# Patient Record
Sex: Male | Born: 1950
Health system: Southern US, Community
[De-identification: ages and names within clinical notes are randomized; demographics above are authoritative.]

## PROBLEM LIST (undated history)

## (undated) DIAGNOSIS — E785 Hyperlipidemia, unspecified: Secondary | ICD-10-CM

## (undated) DIAGNOSIS — I10 Essential (primary) hypertension: Secondary | ICD-10-CM

## (undated) DIAGNOSIS — E8881 Metabolic syndrome: Secondary | ICD-10-CM

## (undated) DIAGNOSIS — I639 Cerebral infarction, unspecified: Secondary | ICD-10-CM

## (undated) HISTORY — DX: Cerebral infarction, unspecified: I63.9

## (undated) HISTORY — DX: Essential (primary) hypertension: I10

## (undated) HISTORY — DX: Hyperlipidemia, unspecified: E78.5

## (undated) HISTORY — DX: Metabolic syndrome: E88.81

## (undated) HISTORY — PX: SHOULDER SURGERY: SHX246

## (undated) HISTORY — DX: Metabolic syndrome: E88.810

## (undated) HISTORY — PX: KNEE ARTHROSCOPY: SUR90

---

## 2003-04-16 ENCOUNTER — Ambulatory Visit (HOSPITAL_COMMUNITY): Admission: RE | Admit: 2003-04-16 | Discharge: 2003-04-16 | Payer: Self-pay | Admitting: Gastroenterology

## 2012-01-16 ENCOUNTER — Other Ambulatory Visit: Payer: Self-pay | Admitting: Dermatology

## 2012-07-25 ENCOUNTER — Other Ambulatory Visit: Payer: Self-pay | Admitting: Dermatology

## 2012-07-31 ENCOUNTER — Other Ambulatory Visit: Payer: Self-pay | Admitting: Dermatology

## 2012-09-17 ENCOUNTER — Encounter: Payer: Self-pay | Admitting: Nurse Practitioner

## 2012-09-17 ENCOUNTER — Ambulatory Visit (INDEPENDENT_AMBULATORY_CARE_PROVIDER_SITE_OTHER): Payer: BC Managed Care – PPO | Admitting: Nurse Practitioner

## 2012-09-17 VITALS — BP 122/76 | HR 66 | Temp 98.7°F | Ht 68.0 in | Wt 216.0 lb

## 2012-09-17 DIAGNOSIS — Z23 Encounter for immunization: Secondary | ICD-10-CM

## 2012-09-17 DIAGNOSIS — I10 Essential (primary) hypertension: Secondary | ICD-10-CM | POA: Insufficient documentation

## 2012-09-17 DIAGNOSIS — E785 Hyperlipidemia, unspecified: Secondary | ICD-10-CM | POA: Insufficient documentation

## 2012-09-17 DIAGNOSIS — Z01419 Encounter for gynecological examination (general) (routine) without abnormal findings: Secondary | ICD-10-CM

## 2012-09-17 DIAGNOSIS — E1159 Type 2 diabetes mellitus with other circulatory complications: Secondary | ICD-10-CM | POA: Insufficient documentation

## 2012-09-17 DIAGNOSIS — M109 Gout, unspecified: Secondary | ICD-10-CM | POA: Insufficient documentation

## 2012-09-17 DIAGNOSIS — Z1211 Encounter for screening for malignant neoplasm of colon: Secondary | ICD-10-CM

## 2012-09-17 DIAGNOSIS — Z125 Encounter for screening for malignant neoplasm of prostate: Secondary | ICD-10-CM

## 2012-09-17 LAB — COMPLETE METABOLIC PANEL WITH GFR
AST: 19 U/L (ref 0–37)
Alkaline Phosphatase: 38 U/L — ABNORMAL LOW (ref 39–117)
BUN: 11 mg/dL (ref 6–23)
GFR, Est Non African American: 80 mL/min
Glucose, Bld: 96 mg/dL (ref 70–99)
Sodium: 140 mEq/L (ref 135–145)
Total Bilirubin: 1.2 mg/dL (ref 0.3–1.2)
Total Protein: 7 g/dL (ref 6.0–8.3)

## 2012-09-17 NOTE — Progress Notes (Signed)
  Subjective:    Patient ID: David Garza, male    DOB: 06-04-1950, 62 y.o.   MRN: [id number]  Hypertension This is a chronic problem. The current episode started more than 1 year ago. The problem is unchanged. The problem is controlled. Pertinent negatives include no blurred vision, chest pain, headaches, malaise/fatigue, palpitations, peripheral edema or shortness of breath. There are no associated agents to hypertension. Risk factors for coronary artery disease include dyslipidemia, male gender and obesity. Past treatments include ACE inhibitors and diuretics. The current treatment provides significant improvement. Compliance problems include diet and exercise.   Hyperlipidemia This is a chronic problem. The current episode started more than 1 year ago. The problem is uncontrolled. Recent lipid tests were reviewed and are high. Exacerbating diseases include obesity. Pertinent negatives include no chest pain, leg pain, myalgias or shortness of breath. Current antihyperlipidemic treatment includes statins. The current treatment provides moderate improvement of lipids. Compliance problems include adherence to diet and adherence to exercise.  Risk factors for coronary artery disease include male sex, hypertension and obesity.  Gout Patient had a flare up several weeks ago. Took colchicine and cleared up.    Review of Systems  Constitutional: Negative for malaise/fatigue.  Eyes: Negative for blurred vision.  Respiratory: Negative for shortness of breath.   Cardiovascular: Negative for chest pain and palpitations.  Musculoskeletal: Negative for myalgias.  Neurological: Negative for headaches.  All other systems reviewed and are negative.       Objective:   Physical Exam  Constitutional: He is oriented to person, place, and time. He appears well-developed and well-nourished.  HENT:  Head: Normocephalic.  Right Ear: External ear normal.  Left Ear: External ear normal.  Nose: Nose  normal.  Mouth/Throat: Oropharynx is clear and moist.  Eyes: EOM are normal. Pupils are equal, round, and reactive to light.  Neck: Normal range of motion. Neck supple. No thyromegaly present.  Cardiovascular: Normal rate, regular rhythm, normal heart sounds and intact distal pulses.   No murmur heard. Pulmonary/Chest: Effort normal and breath sounds normal. He has no wheezes. He has no rales.  Abdominal: Soft. Bowel sounds are normal.  Genitourinary:  Patient refuses DRE.  Musculoskeletal: Normal range of motion.  Neurological: He is alert and oriented to person, place, and time.  Skin: Skin is warm and dry.  Psychiatric: He has a normal mood and affect. His behavior is normal. Judgment and thought content normal.   BP 122/76  Pulse 66  Temp(Src) 98.7 F (37.1 C) (Oral)  Ht 5\' 8"  (1.727 m)  Wt 216 lb (97.977 kg)  BMI 32.85 kg/m2        Assessment & Plan:  1. Hypertension Low na+ diet - COMPLETE METABOLIC PANEL WITH GFR  2. Hyperlipidemia Low fat diet and exercise - NMR Lipoprofile with Lipids  3. Gout Low purine diet  4. Screening for colon cancer - Ambulatory referral to Gastroenterology  5. Screening for prostate cancer - PSA Mary-Margaret Daphine Deutscher, FNP

## 2012-09-17 NOTE — Patient Instructions (Addendum)
Fat and Cholesterol Control Diet Cholesterol levels in your body are determined significantly by your diet. Cholesterol levels may also be related to heart disease. The following material helps to explain this relationship and discusses what you can do to help keep your heart healthy. Not all cholesterol is bad. Low-density lipoprotein (LDL) cholesterol is the "bad" cholesterol. It may cause fatty deposits to build up inside your arteries. High-density lipoprotein (HDL) cholesterol is "good." It helps to remove the "bad" LDL cholesterol from your blood. Cholesterol is a very important risk factor for heart disease. Other risk factors are high blood pressure, smoking, stress, heredity, and weight. The heart muscle gets its supply of blood through the coronary arteries. If your LDL cholesterol is high and your HDL cholesterol is low, you are at risk for having fatty deposits build up in your coronary arteries. This leaves less room through which blood can flow. Without sufficient blood and oxygen, the heart muscle cannot function properly and you may feel chest pains (angina pectoris). When a coronary artery closes up entirely, a part of the heart muscle may die causing a heart attack (myocardial infarction). CHECKING CHOLESTEROL When your caregiver sends your blood to a lab to be examined for cholesterol, a complete lipid (fat) profile may be done. With this test, the total amount of cholesterol and levels of LDL and HDL are determined. Triglycerides are a type of fat that circulates in the blood. They can also be used to determine heart disease risk. The list below describes what the numbers should be: Test: Total Cholesterol.  Less than 200 mg/dl. Test: LDL "bad cholesterol."  Less than 100 mg/dl.  Less than 70 mg/dl if you are at very high risk of a heart attack or sudden cardiac death. Test: HDL "good cholesterol."  Greater than 50 mg/dl for women.  Greater than 40 mg/dl for men. Test:  Triglycerides.  Less than 150 mg/dl. CONTROLLING CHOLESTEROL WITH DIET Although exercise and lifestyle factors are important, your diet is key. That is because certain foods are known to raise cholesterol and others to lower it. The goal is to balance foods for their effect on cholesterol and more importantly, to replace saturated and trans fat with other types of fat, such as monounsaturated fat, polyunsaturated fat, and omega-3 fatty acids. On average, a person should consume no more than 15 to 17 g of saturated fat daily. Saturated and trans fats are considered "bad" fats, and they will raise LDL cholesterol. Saturated fats are primarily found in animal products such as meats, butter, and cream. However, that does not mean you need to give up all your favorite foods. Today, there are good tasting, low-fat, low-cholesterol substitutes for most of the things you like to eat. Choose low-fat or nonfat alternatives. Choose round or loin cuts of red meat. These types of cuts are lowest in fat and cholesterol. Chicken (without the skin), fish, veal, and ground turkey breast are great choices. Eliminate fatty meats, such as hot dogs and salami. Even shellfish have little or no saturated fat. Have a 3 oz (85 g) portion when you eat lean meat, poultry, or fish. Trans fats are also called "partially hydrogenated oils." They are oils that have been scientifically manipulated so that they are solid at room temperature resulting in a longer shelf life and improved taste and texture of foods in which they are added. Trans fats are found in stick margarine, some tub margarines, cookies, crackers, and baked goods.  When baking and cooking, oils   are a great substitute for butter. The monounsaturated oils are especially beneficial since it is believed they lower LDL and raise HDL. The oils you should avoid entirely are saturated tropical oils, such as coconut and palm.  Remember to eat a lot from food groups that are  naturally free of saturated and trans fat, including fish, fruit, vegetables, beans, grains (barley, rice, couscous, bulgur wheat), and pasta (without cream sauces).  IDENTIFYING FOODS THAT LOWER CHOLESTEROL  Soluble fiber may lower your cholesterol. This type of fiber is found in fruits such as apples, vegetables such as broccoli, potatoes, and carrots, legumes such as beans, peas, and lentils, and grains such as barley. Foods fortified with plant sterols (phytosterol) may also lower cholesterol. You should eat at least 2 g per day of these foods for a cholesterol lowering effect.  Read package labels to identify low-saturated fats, trans fat free, and low-fat foods at the supermarket. Select cheeses that have only 2 to 3 g saturated fat per ounce. Use a heart-healthy tub margarine that is free of trans fats or partially hydrogenated oil. When buying baked goods (cookies, crackers), avoid partially hydrogenated oils. Breads and muffins should be made from whole grains (whole-wheat or whole oat flour, instead of "flour" or "enriched flour"). Buy non-creamy canned soups with reduced salt and no added fats.  FOOD PREPARATION TECHNIQUES  Never deep-fry. If you must fry, either stir-fry, which uses very little fat, or use non-stick cooking sprays. When possible, broil, bake, or roast meats, and steam vegetables. Instead of putting butter or margarine on vegetables, use lemon and herbs, applesauce, and cinnamon (for squash and sweet potatoes), nonfat yogurt, salsa, and low-fat dressings for salads.  LOW-SATURATED FAT / LOW-FAT FOOD SUBSTITUTES Meats / Saturated Fat (g)  Avoid: Steak, marbled (3 oz/85 g) / 11 g  Choose: Steak, lean (3 oz/85 g) / 4 g  Avoid: Hamburger (3 oz/85 g) / 7 g  Choose: Hamburger, lean (3 oz/85 g) / 5 g  Avoid: Ham (3 oz/85 g) / 6 g  Choose: Ham, lean cut (3 oz/85 g) / 2.4 g  Avoid: Chicken, with skin, dark meat (3 oz/85 g) / 4 g  Choose: Chicken, skin removed, dark meat (3  oz/85 g) / 2 g  Avoid: Chicken, with skin, light meat (3 oz/85 g) / 2.5 g  Choose: Chicken, skin removed, light meat (3 oz/85 g) / 1 g Dairy / Saturated Fat (g)  Avoid: Whole milk (1 cup) / 5 g  Choose: Low-fat milk, 2% (1 cup) / 3 g  Choose: Low-fat milk, 1% (1 cup) / 1.5 g  Choose: Skim milk (1 cup) / 0.3 g  Avoid: Hard cheese (1 oz/28 g) / 6 g  Choose: Skim milk cheese (1 oz/28 g) / 2 to 3 g  Avoid: Cottage cheese, 4% fat (1 cup) / 6.5 g  Choose: Low-fat cottage cheese, 1% fat (1 cup) / 1.5 g  Avoid: Ice cream (1 cup) / 9 g  Choose: Sherbet (1 cup) / 2.5 g  Choose: Nonfat frozen yogurt (1 cup) / 0.3 g  Choose: Frozen fruit bar / trace  Avoid: Whipped cream (1 tbs) / 3.5 g  Choose: Nondairy whipped topping (1 tbs) / 1 g Condiments / Saturated Fat (g)  Avoid: Mayonnaise (1 tbs) / 2 g  Choose: Low-fat mayonnaise (1 tbs) / 1 g  Avoid: Butter (1 tbs) / 7 g  Choose: Extra light margarine (1 tbs) / 1 g  Avoid: Coconut oil (1   tbs) / 11.8 g  Choose: Olive oil (1 tbs) / 1.8 g  Choose: Corn oil (1 tbs) / 1.7 g  Choose: Safflower oil (1 tbs) / 1.2 g  Choose: Sunflower oil (1 tbs) / 1.4 g  Choose: Soybean oil (1 tbs) / 2.4 g  Choose: Canola oil (1 tbs) / 1 g Document Released: 04/18/2005 Document Revised: 07/11/2011 Document Reviewed: 10/07/2010 [facility] Patient Information 2013 [place], [place]. Tetanus, Diphtheria, Pertussis (Tdap) Vaccine What You Need to Know WHY GET VACCINATED? Tetanus, diphtheria and pertussis can be very serious diseases, even for adolescents and adults. Tdap vaccine can protect [place] from these diseases. TETANUS (Lockjaw) causes painful muscle tightening and stiffness, usually all over the body.  It can lead to tightening of muscles in the head and neck so you can't open your mouth, swallow, or sometimes even breathe. Tetanus kills about 1 out of 5 people who are infected. DIPHTHERIA can cause a thick coating to form in the back of the  throat.  It can lead to breathing problems, paralysis, heart failure, and death. PERTUSSIS (Whooping Cough) causes severe coughing spells, which can cause difficulty breathing, vomiting and disturbed sleep.  It can also lead to weight loss, incontinence, and rib fractures. Up to 2 in 100 adolescents and 5 in 100 adults with pertussis are hospitalized or have complications, which could include pneumonia and death. These diseases are caused by bacteria. Diphtheria and pertussis are spread from person to person through coughing or sneezing. Tetanus enters the body through cuts, scratches, or wounds. Before vaccines, the [place] saw as many as 200,000 cases a year of diphtheria and pertussis, and hundreds of cases of tetanus. Since vaccination began, tetanus and diphtheria have dropped by about 99% and pertussis by about 80%. TDAP VACCINE Tdap vaccine can protect adolescents and adults from tetanus, diphtheria, and pertussis. One dose of Tdap is routinely given at age 5 or 74. People who did not get Tdap at that age should get it as soon as possible. Tdap is especially important for health care professionals and anyone having close contact with a baby younger than 12 months. Pregnant women should get a dose of Tdap during every pregnancy, to protect the newborn from pertussis. Infants are most at risk for severe, life-threatening complications from pertussis. A similar vaccine, called Td, protects from tetanus and diphtheria, but not pertussis. A Td booster should be given every 10 years. Tdap may be given as one of these boosters if you have not already gotten a dose. Tdap may also be given after a severe cut or burn to prevent tetanus infection. Your doctor can give you more information. Tdap may safely be given at the same time as other vaccines. SOME PEOPLE SHOULD NOT GET THIS VACCINE  If you ever had a life-threatening allergic reaction after a dose of any tetanus, diphtheria, or pertussis  containing vaccine, OR if you have a severe allergy to any part of this vaccine, you should not get Tdap. Tell your doctor if you have any severe allergies.  If you had a coma, or long or multiple seizures within 7 days after a childhood dose of DTP or DTaP, you should not get Tdap, unless a cause other than the vaccine was found. You can still get Td.  Talk to your doctor if you:  have epilepsy or another nervous system problem,  had severe pain or swelling after any vaccine containing diphtheria, tetanus or pertussis,  ever had Guillain-Barr Syndrome (GBS),  aren't feeling well  on the day the shot is scheduled. RISKS OF A VACCINE REACTION With any medicine, including vaccines, there is a chance of side effects. These are usually mild and go away on their own, but serious reactions are also possible. Brief fainting spells can follow a vaccination, leading to injuries from falling. Sitting or lying down for about 15 minutes can help prevent these. Tell your doctor if you feel dizzy or light-headed, or have vision changes or ringing in the ears. Mild problems following Tdap (Did not interfere with activities)  Pain where the shot was given (about 3 in 4 adolescents or 2 in 3 adults)  Redness or swelling where the shot was given (about 1 person in 5)  Mild fever of at least 100.44F (up to about 1 in 25 adolescents or 1 in 100 adults)  Headache (about 3 or 4 people in 10)  Tiredness (about 1 person in 3 or 4)  Nausea, vomiting, diarrhea, stomach ache (up to 1 in 4 adolescents or 1 in 10 adults)  Chills, body aches, sore joints, rash, swollen glands (uncommon) Moderate problems following Tdap (Interfered with activities, but did not require medical attention)  Pain where the shot was given (about 1 in 5 adolescents or 1 in 100 adults)  Redness or swelling where the shot was given (up to about 1 in 16 adolescents or 1 in 25 adults)  Fever over 102F (about 1 in 100 adolescents or 1  in 250 adults)  Headache (about 3 in 20 adolescents or 1 in 10 adults)  Nausea, vomiting, diarrhea, stomach ache (up to 1 or 3 people in 100)  Swelling of the entire arm where the shot was given (up to about 3 in 100). Severe problems following Tdap (Unable to perform usual activities, required medical attention)  Swelling, severe pain, bleeding and redness in the arm where the shot was given (rare). A severe allergic reaction could occur after any vaccine (estimated less than 1 in a million doses). WHAT IF THERE IS A SERIOUS REACTION? What should I look for?  Look for anything that concerns you, such as signs of a severe allergic reaction, very high fever, or behavior changes. Signs of a severe allergic reaction can include hives, swelling of the face and throat, difficulty breathing, a fast heartbeat, dizziness, and weakness. These would start a few minutes to a few hours after the vaccination. What should I do?  If you think it is a severe allergic reaction or other emergency that can't wait, call 9-1-1 or get the person to the nearest hospital. Otherwise, call your doctor.  Afterward, the reaction should be reported to the "Vaccine Adverse Event Reporting System" (VAERS). Your doctor might file this report, or you can do it yourself through the VAERS web site at www.vaers.LAgents.no, or by calling 1-302-437-1848. VAERS is only for reporting reactions. They do not give medical advice.  THE NATIONAL VACCINE INJURY COMPENSATION PROGRAM The National Vaccine Injury Compensation Program (VICP) is a federal program that was created to compensate people who may have been injured by certain vaccines. Persons who believe they may have been injured by a vaccine can learn about the program and about filing a claim by calling 1-228-502-9780 or visiting the VICP website at SpiritualWord.at. HOW CAN I LEARN MORE?  Ask your doctor.  Call your local or state health department.  Contact  the Centers for Disease Control and Prevention (CDC):  Call 435-654-6286 or visit CDC's website at PicCapture.uy CDC Tdap Vaccine VIS (09/08/11) Document Released:  10/18/2011 Document Reviewed: 10/18/2011 [employer] Patient Information 2013 [place], [place].

## 2012-09-21 LAB — NMR LIPOPROFILE WITH LIPIDS
Cholesterol, Total: 152 mg/dL (ref <200)
HDL Particle Number: 29 umol/L — ABNORMAL LOW (ref >=30.5)
LDL Particle Number: 1679 nmol/L — ABNORMAL HIGH (ref <1000)
Large HDL-P: <1.3 umol/L — ABNORMAL LOW (ref >=4.8)
Large VLDL-P: <0.8 nmol/L (ref <=2.7)
Small LDL Particle Number: 1398 nmol/L — ABNORMAL HIGH (ref <=527)
VLDL Size: 45.9 nm (ref <=46.6)

## 2012-09-25 ENCOUNTER — Other Ambulatory Visit: Payer: Self-pay | Admitting: Nurse Practitioner

## 2012-09-25 MED ORDER — ATORVASTATIN CALCIUM 40 MG PO TABS: 40.0000 mg | ORAL_TABLET | Freq: Every day | ORAL | Status: DC

## 2012-09-26 ENCOUNTER — Telehealth: Payer: Self-pay | Admitting: Nurse Practitioner

## 2012-09-26 NOTE — Telephone Encounter (Signed)
Pt aware of results 

## 2012-10-01 ENCOUNTER — Other Ambulatory Visit: Payer: Self-pay | Admitting: *Deleted

## 2012-10-01 NOTE — Telephone Encounter (Signed)
Pt doesn't want to take lipitor he wants to go back on pravastatin.

## 2012-10-01 NOTE — Telephone Encounter (Signed)
Not strong enough

## 2012-10-01 NOTE — Telephone Encounter (Signed)
Patient refuse Lipitor will only take pravastatin

## 2012-10-01 NOTE — Telephone Encounter (Signed)
Ok pravaststin better than nothing

## 2012-10-02 ENCOUNTER — Telehealth: Payer: Self-pay | Admitting: Nurse Practitioner

## 2012-10-02 MED ORDER — PRAVASTATIN SODIUM 40 MG PO TABS: 40.0000 mg | ORAL_TABLET | Freq: Every day | ORAL | Status: DC

## 2012-10-10 NOTE — Telephone Encounter (Signed)
Pt wife aware rx up front

## 2012-11-01 ENCOUNTER — Other Ambulatory Visit: Payer: Self-pay | Admitting: Dermatology

## 2013-01-11 ENCOUNTER — Encounter: Payer: Self-pay | Admitting: Nurse Practitioner

## 2013-01-11 ENCOUNTER — Ambulatory Visit (INDEPENDENT_AMBULATORY_CARE_PROVIDER_SITE_OTHER): Payer: BC Managed Care – PPO | Admitting: Nurse Practitioner

## 2013-01-11 VITALS — BP 141/78 | HR 67 | Temp 97.0°F | Ht 68.0 in | Wt 217.0 lb

## 2013-01-11 DIAGNOSIS — E785 Hyperlipidemia, unspecified: Secondary | ICD-10-CM

## 2013-01-11 DIAGNOSIS — I10 Essential (primary) hypertension: Secondary | ICD-10-CM

## 2013-01-11 NOTE — Patient Instructions (Signed)
Health Maintenance, Males A healthy lifestyle and preventative care can promote health and wellness.  Maintain regular health, dental, and eye exams.  Eat a healthy diet. Foods like vegetables, fruits, whole grains, low-fat dairy products, and lean protein foods contain the nutrients you need without too many calories. Decrease your intake of foods high in solid fats, added sugars, and salt. Get information about a proper diet from your caregiver, if necessary.  Regular physical exercise is one of the most important things you can do for your health. Most adults should get at least 150 minutes of moderate-intensity exercise (any activity that increases your heart rate and causes you to sweat) each week. In addition, most adults need muscle-strengthening exercises on 2 or more days a week.   Maintain a healthy weight. The body mass index (BMI) is a screening tool to identify possible weight problems. It provides an estimate of body fat based on height and weight. Your caregiver can help determine your BMI, and can help you achieve or maintain a healthy weight. For adults 20 years and older:  A BMI below 18.5 is considered underweight.  A BMI of 18.5 to 24.9 is normal.  A BMI of 25 to 29.9 is considered overweight.  A BMI of 30 and above is considered obese.  Maintain normal blood lipids and cholesterol by exercising and minimizing your intake of saturated fat. Eat a balanced diet with plenty of fruits and vegetables. Blood tests for lipids and cholesterol should begin at age 20 and be repeated every 5 years. If your lipid or cholesterol levels are high, you are over 50, or you are a high risk for heart disease, you may need your cholesterol levels checked more frequently.Ongoing high lipid and cholesterol levels should be treated with medicines, if diet and exercise are not effective.  If you smoke, find out from your caregiver how to quit. If you do not use tobacco, do not start.  If you  choose to drink alcohol, do not exceed 2 drinks per day. One drink is considered to be 12 ounces (355 mL) of beer, 5 ounces (148 mL) of wine, or 1.5 ounces (44 mL) of liquor.  Avoid use of street drugs. Do not share needles with anyone. Ask for help if you need support or instructions about stopping the use of drugs.  High blood pressure causes heart disease and increases the risk of stroke. Blood pressure should be checked at least every 1 to 2 years. Ongoing high blood pressure should be treated with medicines if weight loss and exercise are not effective.  If you are 45 to 61 years old, ask your caregiver if you should take aspirin to prevent heart disease.  Diabetes screening involves taking a blood sample to check your fasting blood sugar level. This should be done once every 3 years, after age 45, if you are within normal weight and without risk factors for diabetes. Testing should be considered at a younger age or be carried out more frequently if you are overweight and have at least 1 risk factor for diabetes.  Colorectal cancer can be detected and often prevented. Most routine colorectal cancer screening begins at the age of 50 and continues through age 75. However, your caregiver may recommend screening at an earlier age if you have risk factors for colon cancer. On a yearly basis, your caregiver may provide home test kits to check for hidden blood in the stool. Use of a small camera at the end of a tube,   to directly examine the colon (sigmoidoscopy or colonoscopy), can detect the earliest forms of colorectal cancer. Talk to your caregiver about this at age 50, when routine screening begins. Direct examination of the colon should be repeated every 5 to 10 years through age 75, unless early forms of pre-cancerous polyps or small growths are found.  Hepatitis C blood testing is recommended for all people born from 1945 through 1965 and any individual with known risks for hepatitis C.  Healthy  men should no longer receive prostate-specific antigen (PSA) blood tests as part of routine cancer screening. Consult with your caregiver about prostate cancer screening.  Testicular cancer screening is not recommended for adolescents or adult males who have no symptoms. Screening includes self-exam, caregiver exam, and other screening tests. Consult with your caregiver about any symptoms you have or any concerns you have about testicular cancer.  Practice safe sex. Use condoms and avoid high-risk sexual practices to reduce the spread of sexually transmitted infections (STIs).  Use sunscreen with a sun protection factor (SPF) of 30 or greater. Apply sunscreen liberally and repeatedly throughout the day. You should seek shade when your shadow is shorter than you. Protect yourself by wearing long sleeves, pants, a wide-brimmed hat, and sunglasses year round, whenever you are outdoors.  Notify your caregiver of new moles or changes in moles, especially if there is a change in shape or color. Also notify your caregiver if a mole is larger than the size of a pencil eraser.  A one-time screening for abdominal aortic aneurysm (AAA) and surgical repair of large AAAs by sound wave imaging (ultrasonography) is recommended for ages 65 to 75 years who are current or former smokers.  Stay current with your immunizations. Document Released: 10/15/2007 Document Revised: 07/11/2011 Document Reviewed: 09/13/2010 [employer] Patient Information 2014 [employer].  

## 2013-01-11 NOTE — Progress Notes (Signed)
  Subjective:    Patient ID: David Garza, male    DOB: 05/04/1950, 62 y.o.   MRN: [id number]  Hypertension This is a chronic problem. The current episode started more than 1 year ago. The problem is unchanged. The problem is controlled. Pertinent negatives include no blurred vision, chest pain, headaches, malaise/fatigue, palpitations, peripheral edema or shortness of breath. There are no associated agents to hypertension. Risk factors for coronary artery disease include dyslipidemia, male gender and obesity. Past treatments include ACE inhibitors and diuretics. The current treatment provides significant improvement. Compliance problems include diet and exercise.   Hyperlipidemia This is a chronic problem. The current episode started more than 1 year ago. The problem is uncontrolled. Recent lipid tests were reviewed and are high. Exacerbating diseases include obesity. Pertinent negatives include no chest pain, leg pain, myalgias or shortness of breath. Current antihyperlipidemic treatment includes statins. The current treatment provides moderate improvement of lipids. Compliance problems include adherence to diet and adherence to exercise.  Risk factors for coronary artery disease include male sex, hypertension and obesity.  Gout Patient had a flare up several weeks ago. Took colchicine and cleared up.    Review of Systems  Constitutional: Negative for malaise/fatigue.  Eyes: Negative for blurred vision.  Respiratory: Negative for shortness of breath.   Cardiovascular: Negative for chest pain and palpitations.  Musculoskeletal: Negative for myalgias.  Neurological: Negative for headaches.  All other systems reviewed and are negative.       Objective:   Physical Exam  Constitutional: He is oriented to person, place, and time. He appears well-developed and well-nourished.  HENT:  Head: Normocephalic.  Right Ear: External ear normal.  Left Ear: External ear normal.  Nose: Nose  normal.  Mouth/Throat: Oropharynx is clear and moist.  Eyes: EOM are normal. Pupils are equal, round, and reactive to light.  Neck: Normal range of motion. Neck supple. No thyromegaly present.  Cardiovascular: Normal rate, regular rhythm, normal heart sounds and intact distal pulses.   No murmur heard. Pulmonary/Chest: Effort normal and breath sounds normal. He has no wheezes. He has no rales.  Abdominal: Soft. Bowel sounds are normal.  Genitourinary:  Patient refuses DRE.  Musculoskeletal: Normal range of motion.  Neurological: He is alert and oriented to person, place, and time.  Skin: Skin is warm and dry.  Psychiatric: He has a normal mood and affect. His behavior is normal. Judgment and thought content normal.   BP 141/78  Pulse 67  Temp(Src) 97 F (36.1 C) (Oral)  Ht 5\' 8"  (1.727 m)  Wt 217 lb (98.431 kg)  BMI 33 kg/m2        Assessment & Plan:  1. Hypertension Low na+ diet Patient going to keep diary of blood pressure and let me know how it is running- may need to increase meds ( patient doesn't want to increase right now because just got 90 day supply. - COMPLETE METABOLIC PANEL WITH GFR  2. Hyperlipidemia Low fat diet and exercise - NMR Lipoprofile with Lipids  3. Gout Low purine diet  4. Screening for colon cancer - Ambulatory referral to Gastroenterology- wants to wit until December of 2014  Milda Smart, FNP

## 2013-01-13 LAB — CMP14+EGFR
Albumin/Globulin Ratio: 1.9 (ref 1.1–2.5)
Albumin: 4.5 g/dL (ref 3.6–4.8)
Alkaline Phosphatase: 40 IU/L (ref 39–117)
BUN/Creatinine Ratio: 11 (ref 10–22)
BUN: 11 mg/dL (ref 8–27)
Creatinine, Ser: 0.98 mg/dL (ref 0.76–1.27)
GFR calc Af Amer: 96 mL/min/{1.73_m2} (ref >59)
GFR calc non Af Amer: 83 mL/min/{1.73_m2} (ref >59)
Globulin, Total: 2.4 g/dL (ref 1.5–4.5)
Total Bilirubin: 1.2 mg/dL (ref 0.0–1.2)
Total Protein: 6.9 g/dL (ref 6.0–8.5)

## 2013-01-13 LAB — NMR, LIPOPROFILE
Cholesterol: 144 mg/dL (ref <200)
HDL Particle Number: 30 umol/L — ABNORMAL LOW (ref >=30.5)
LDL Size: 19.6 nm — ABNORMAL LOW (ref >20.5)
LP-IR Score: 71 — ABNORMAL HIGH (ref <=45)
Triglycerides by NMR: 142 mg/dL (ref <150)

## 2013-01-16 ENCOUNTER — Telehealth: Payer: Self-pay | Admitting: Nurse Practitioner

## 2013-01-16 NOTE — Telephone Encounter (Signed)
Patient aware.

## 2013-01-31 ENCOUNTER — Telehealth: Payer: Self-pay | Admitting: Nurse Practitioner

## 2013-01-31 NOTE — Telephone Encounter (Signed)
Patient aware.

## 2013-01-31 NOTE — Telephone Encounter (Signed)
Just cut lipitor in half first and take 1/2 of dose and see if can tolerate

## 2013-03-19 ENCOUNTER — Ambulatory Visit (INDEPENDENT_AMBULATORY_CARE_PROVIDER_SITE_OTHER): Payer: BC Managed Care – PPO | Admitting: *Deleted

## 2013-03-19 DIAGNOSIS — Z23 Encounter for immunization: Secondary | ICD-10-CM

## 2013-04-15 ENCOUNTER — Other Ambulatory Visit (INDEPENDENT_AMBULATORY_CARE_PROVIDER_SITE_OTHER): Payer: BC Managed Care – PPO

## 2013-04-15 DIAGNOSIS — I10 Essential (primary) hypertension: Secondary | ICD-10-CM

## 2013-04-15 DIAGNOSIS — E785 Hyperlipidemia, unspecified: Secondary | ICD-10-CM

## 2013-04-15 NOTE — Progress Notes (Signed)
Pt came in for labs only 

## 2013-04-17 ENCOUNTER — Other Ambulatory Visit: Payer: Self-pay

## 2013-04-17 LAB — NMR, LIPOPROFILE
HDL Cholesterol by NMR: 36 mg/dL — ABNORMAL LOW (ref >=40)
HDL Particle Number: 27.6 umol/L — ABNORMAL LOW (ref >=30.5)
LDLC SERPL CALC-MCNC: 67 mg/dL (ref <100)
LP-IR Score: 60 — ABNORMAL HIGH (ref <=45)
Small LDL Particle Number: 1083 nmol/L — ABNORMAL HIGH (ref <=527)

## 2013-04-17 LAB — CMP14+EGFR
ALT: 19 IU/L (ref 0–44)
AST: 13 IU/L (ref 0–40)
Albumin/Globulin Ratio: 2.5 (ref 1.1–2.5)
Alkaline Phosphatase: 44 IU/L (ref 39–117)
BUN/Creatinine Ratio: 10 (ref 10–22)
CO2: 24 mmol/L (ref 18–29)
Calcium: 9.8 mg/dL (ref 8.6–10.2)
Globulin, Total: 1.8 g/dL (ref 1.5–4.5)
Potassium: 4.1 mmol/L (ref 3.5–5.2)
Sodium: 142 mmol/L (ref 134–144)

## 2013-04-17 MED ORDER — ENALAPRIL-HYDROCHLOROTHIAZIDE 5-12.5 MG PO TABS: 1.0000 | ORAL_TABLET | Freq: Every day | ORAL | Status: DC

## 2013-04-17 NOTE — Telephone Encounter (Signed)
Last seen 01/11/13  MMM If approved print for mail order and route to nurse

## 2013-04-18 ENCOUNTER — Telehealth: Payer: Self-pay | Admitting: Nurse Practitioner

## 2013-04-23 ENCOUNTER — Encounter: Payer: Self-pay | Admitting: Nurse Practitioner

## 2013-04-23 ENCOUNTER — Telehealth: Payer: Self-pay | Admitting: Nurse Practitioner

## 2013-04-23 ENCOUNTER — Ambulatory Visit (INDEPENDENT_AMBULATORY_CARE_PROVIDER_SITE_OTHER): Payer: BC Managed Care – PPO | Admitting: Nurse Practitioner

## 2013-04-23 VITALS — BP 127/77 | HR 67 | Temp 97.3°F | Ht 68.0 in | Wt 223.0 lb

## 2013-04-23 DIAGNOSIS — Z9889 Other specified postprocedural states: Secondary | ICD-10-CM

## 2013-04-23 DIAGNOSIS — I10 Essential (primary) hypertension: Secondary | ICD-10-CM

## 2013-04-23 DIAGNOSIS — E785 Hyperlipidemia, unspecified: Secondary | ICD-10-CM

## 2013-04-23 DIAGNOSIS — M109 Gout, unspecified: Secondary | ICD-10-CM

## 2013-04-23 MED ORDER — ROSUVASTATIN CALCIUM 10 MG PO TABS: 10.0000 mg | ORAL_TABLET | Freq: Every day | ORAL | Status: DC

## 2013-04-23 MED ORDER — ENALAPRIL-HYDROCHLOROTHIAZIDE 5-12.5 MG PO TABS: 1.0000 | ORAL_TABLET | Freq: Every day | ORAL | Status: DC

## 2013-04-23 NOTE — Patient Instructions (Signed)

## 2013-04-23 NOTE — Progress Notes (Signed)
Subjective:    Patient ID: David Garza, male    DOB: 1950-08-04, 62 y.o.   MRN: [id number]  Patient in today for follow up- no changes since last visit- no complaints  Hypertension This is a chronic problem. The current episode started more than 1 year ago. The problem is unchanged. The problem is controlled. Pertinent negatives include no blurred vision, chest pain, headaches, malaise/fatigue, palpitations, peripheral edema or shortness of breath. There are no associated agents to hypertension. Risk factors for coronary artery disease include dyslipidemia, male gender and obesity. Past treatments include ACE inhibitors and diuretics. The current treatment provides significant improvement. Compliance problems include diet and exercise.   Hyperlipidemia This is a chronic problem. The current episode started more than 1 year ago. The problem is uncontrolled. Recent lipid tests were reviewed and are high. Exacerbating diseases include obesity. Pertinent negatives include no chest pain, leg pain, myalgias or shortness of breath. Current antihyperlipidemic treatment includes statins (can't tolerate lipitor). The current treatment provides moderate improvement of lipids. Compliance problems include adherence to diet and adherence to exercise.  Risk factors for coronary artery disease include male sex, hypertension and obesity.  Gout Patient had a flare up several weeks ago. Took colchicine and cleared up.    Review of Systems  Constitutional: Negative for malaise/fatigue.  Eyes: Negative for blurred vision.  Respiratory: Negative for shortness of breath.   Cardiovascular: Negative for chest pain and palpitations.  Musculoskeletal: Negative for myalgias.  Neurological: Negative for headaches.  All other systems reviewed and are negative.       Objective:   Physical Exam  Constitutional: He is oriented to person, place, and time. He appears well-developed and well-nourished.  HENT:   Head: Normocephalic.  Right Ear: External ear normal.  Left Ear: External ear normal.  Nose: Nose normal.  Mouth/Throat: Oropharynx is clear and moist.  Eyes: EOM are normal. Pupils are equal, round, and reactive to light.  Neck: Normal range of motion. Neck supple. No thyromegaly present.  Cardiovascular: Normal rate, regular rhythm, normal heart sounds and intact distal pulses.   No murmur heard. Pulmonary/Chest: Effort normal and breath sounds normal. He has no wheezes. He has no rales.  Abdominal: Soft. Bowel sounds are normal.  Genitourinary:  Patient refuses DRE.  Musculoskeletal: Normal range of motion.  Neurological: He is alert and oriented to person, place, and time.  Skin: Skin is warm and dry.  Psychiatric: He has a normal mood and affect. His behavior is normal. Judgment and thought content normal.   BP 127/77  Pulse 67  Temp(Src) 97.3 F (36.3 C) (Oral)  Ht 5\' 8"  (1.727 m)  Wt 223 lb (101.152 kg)  BMI 33.91 kg/m2        Assessment & Plan:   1. Hypertension   2. Hyperlipidemia   3. Gout   4. H/O colonoscopy    Orders Placed This Encounter  Procedures  . Ambulatory referral to General Surgery    Referral Priority:  Routine    Referral Type:  Surgical    Referral Reason:  Specialty Services Required    Requested Specialty:  General Surgery    Number of Visits Requested:  1   Meds ordered this encounter  Medications  . rosuvastatin (CRESTOR) 10 MG tablet    Sig: Take 1 tablet (10 mg total) by mouth daily.    Dispense:  30 tablet    Refill:  0    Order Specific Question:  Supervising Provider    Answer:  Rudi Heap W [[id number]]  . Enalapril-Hydrochlorothiazide 5-12.5 MG per tablet    Sig: Take 1 tablet by mouth daily.    Dispense:  90 tablet    Refill:  1    Order Specific Question:  Supervising Provider    Answer:  Deborra Medina   Stopped lipitor and changed to crestor- patient will let me know if he can tolerate Continue all  meds Labs pending Diet and exercise encouraged Health maintenance reviewed Follow up in 3 months Referral for colonoscopy  Mary-Margaret Daphine Deutscher, FNP

## 2013-04-24 NOTE — Telephone Encounter (Signed)
If has private insurance can guve coupon which will make cretor affordable- pravachol not strong enough to bring cholesterol down

## 2013-04-29 ENCOUNTER — Telehealth: Payer: Self-pay | Admitting: Nurse Practitioner

## 2013-04-29 ENCOUNTER — Other Ambulatory Visit: Payer: Self-pay | Admitting: Nurse Practitioner

## 2013-04-29 MED ORDER — PRAVASTATIN SODIUM 80 MG PO TABS: 80.0000 mg | ORAL_TABLET | Freq: Every day | ORAL | Status: DC

## 2013-04-29 NOTE — Telephone Encounter (Signed)
They said they want to go back on the pravchole

## 2013-04-30 NOTE — Telephone Encounter (Signed)
Sent in pravachol rx yesterday

## 2013-07-16 ENCOUNTER — Other Ambulatory Visit (INDEPENDENT_AMBULATORY_CARE_PROVIDER_SITE_OTHER): Payer: BC Managed Care – PPO

## 2013-07-16 DIAGNOSIS — I10 Essential (primary) hypertension: Secondary | ICD-10-CM

## 2013-07-16 DIAGNOSIS — E785 Hyperlipidemia, unspecified: Secondary | ICD-10-CM

## 2013-07-16 NOTE — Progress Notes (Signed)
Pt came in for labs only 

## 2013-07-17 LAB — CMP14+EGFR
A/G RATIO: 2.3 (ref 1.1–2.5)
ALT: 25 IU/L (ref 0–44)
AST: 18 IU/L (ref 0–40)
Albumin: 4.5 g/dL (ref 3.6–4.8)
Alkaline Phosphatase: 37 IU/L — ABNORMAL LOW (ref 39–117)
BILIRUBIN TOTAL: 0.9 mg/dL (ref 0.0–1.2)
BUN/Creatinine Ratio: 12 (ref 10–22)
BUN: 13 mg/dL (ref 8–27)
CO2: 25 mmol/L (ref 18–29)
CREATININE: 1.09 mg/dL (ref 0.76–1.27)
Calcium: 9.6 mg/dL (ref 8.6–10.2)
Chloride: 100 mmol/L (ref 97–108)
GFR, EST AFRICAN AMERICAN: 84 mL/min/{1.73_m2} (ref >59)
GFR, EST NON AFRICAN AMERICAN: 73 mL/min/{1.73_m2} (ref >59)
GLOBULIN, TOTAL: 2 g/dL (ref 1.5–4.5)
GLUCOSE: 96 mg/dL (ref 65–99)
Potassium: 4.2 mmol/L (ref 3.5–5.2)
SODIUM: 142 mmol/L (ref 134–144)
TOTAL PROTEIN: 6.5 g/dL (ref 6.0–8.5)

## 2013-07-17 LAB — NMR, LIPOPROFILE
Cholesterol: 150 mg/dL
HDL Cholesterol by NMR: 35 mg/dL — ABNORMAL LOW
HDL Particle Number: 28.9 umol/L — ABNORMAL LOW
LDL Particle Number: 1438 nmol/L — ABNORMAL HIGH
LDL Size: 19.7 nm — ABNORMAL LOW
LDLC SERPL CALC-MCNC: 82 mg/dL
LP-IR Score: 73 — ABNORMAL HIGH
Small LDL Particle Number: 1170 nmol/L — ABNORMAL HIGH
Triglycerides by NMR: 163 mg/dL — ABNORMAL HIGH

## 2013-07-18 ENCOUNTER — Other Ambulatory Visit: Payer: Self-pay | Admitting: *Deleted

## 2013-07-18 DIAGNOSIS — I10 Essential (primary) hypertension: Secondary | ICD-10-CM

## 2013-07-18 DIAGNOSIS — E785 Hyperlipidemia, unspecified: Secondary | ICD-10-CM

## 2013-07-18 NOTE — Progress Notes (Signed)
Appt scheduled in 3 months for f/u. Patient wants to come in a few days before appt to have labs drawn. Order placed.

## 2013-07-24 ENCOUNTER — Ambulatory Visit: Payer: BC Managed Care – PPO | Admitting: Nurse Practitioner

## 2013-08-19 ENCOUNTER — Encounter: Payer: Self-pay | Admitting: Family Medicine

## 2013-08-19 ENCOUNTER — Ambulatory Visit (INDEPENDENT_AMBULATORY_CARE_PROVIDER_SITE_OTHER): Payer: BC Managed Care – PPO | Admitting: Family Medicine

## 2013-08-19 ENCOUNTER — Ambulatory Visit (INDEPENDENT_AMBULATORY_CARE_PROVIDER_SITE_OTHER): Payer: BC Managed Care – PPO

## 2013-08-19 VITALS — BP 149/80 | HR 84 | Temp 97.0°F | Ht 68.0 in | Wt 218.0 lb

## 2013-08-19 DIAGNOSIS — M25561 Pain in right knee: Secondary | ICD-10-CM

## 2013-08-19 DIAGNOSIS — M25569 Pain in unspecified knee: Secondary | ICD-10-CM

## 2013-08-19 NOTE — Progress Notes (Signed)
   Subjective:    Patient ID: David Garza, male    DOB: [birth year], 63 y.o.   MRN: [id number]  HPI This 63 y.o. male presents for evaluation of right knee pain and discomfort. He has been having arthralgias and pain in his knee for over 2 weeks and has been taking motrin consistently and it is Not helping.  He has hx of OA.   Review of Systems C/o right knee pain No chest pain, SOB, HA, dizziness, vision change, N/V, diarrhea, constipation, dysuria, urinary urgency or frequency, myalgias, arthralgias or rash.     Objective:   Physical Exam Vital signs noted  Well developed well nourished male.  HEENT - Head atraumatic Normocephalic                Eyes - PERRLA, Conjuctiva - clear Sclera- Clear EOMI                Throat - oropharanx wnl Respiratory - Lungs CTA bilateral Cardiac - RRR S1 and S2 without murmur MS - TTP right knee   Procedure - right lateral knee prepped and then injected with 2 cc's of marcaine and one cc Of kenalog 40mg .  Patient expresses immediate relief of knee pain.  Xray right knee - No fracture Prelimnary reading by Iverson Alamin    Assessment & Plan:  Right knee pain - Plan: DG Knee 1-2 Views Right Knee injection 2 cc's marcaine and one cc kenalog 40mg   Follow up prn.  Lysbeth Penner FNP

## 2013-08-29 ENCOUNTER — Encounter: Payer: Self-pay | Admitting: Nurse Practitioner

## 2013-08-29 ENCOUNTER — Ambulatory Visit (INDEPENDENT_AMBULATORY_CARE_PROVIDER_SITE_OTHER): Payer: BC Managed Care – PPO | Admitting: Nurse Practitioner

## 2013-08-29 VITALS — BP 141/86 | HR 90 | Temp 97.5°F | Ht 68.0 in | Wt 216.0 lb

## 2013-08-29 DIAGNOSIS — M25569 Pain in unspecified knee: Secondary | ICD-10-CM

## 2013-08-29 DIAGNOSIS — M25561 Pain in right knee: Secondary | ICD-10-CM

## 2013-08-29 MED ORDER — HYDROCODONE-ACETAMINOPHEN 10-325 MG PO TABS: 1.0000 | ORAL_TABLET | Freq: Three times a day (TID) | ORAL | Status: DC | PRN

## 2013-08-29 NOTE — Progress Notes (Signed)
   Subjective:    Patient ID: David Garza, male    DOB: 1951-03-05, 63 y.o.   MRN: [id number]  HPI Patient in c/o right knee pain- Was seen by B. Oxford and were c/o right knee pian then and he did steriod injection into knee and it got better. Then 3 days ago he had to chase a calf that got loose from his land and that irritated it. He then went to crank 4 wheeler this morning and felt a pop in his knee and now can't walk on it.    Review of Systems  Respiratory: Negative.   Cardiovascular: Negative.   All other systems reviewed and are negative.      Objective:   Physical Exam  Constitutional: He is oriented to person, place, and time. He appears well-developed and well-nourished.  Cardiovascular: Normal rate, regular rhythm and normal heart sounds.   Pulmonary/Chest: Effort normal and breath sounds normal.  Musculoskeletal:  Mild right knee effusion with pain on palaption posteriorly. No patella tenderness- no crepitus All ligaments are intact.  Neurological: He is alert and oriented to person, place, and time.  Skin: Skin is warm.  Psychiatric: He has a normal mood and affect. His behavior is normal. Judgment and thought content normal.   BP 141/86  Pulse 90  Temp(Src) 97.5 F (36.4 C) (Oral)  Ht 5\' 8"  (1.727 m)  Wt 216 lb (97.977 kg)  BMI 32.85 kg/m2        Assessment & Plan:   1. Right knee pain    Meds ordered this encounter  Medications  . HYDROcodone-acetaminophen (NORCO) 10-325 MG per tablet    Sig: Take 1 tablet by mouth every 8 (eight) hours as needed.    Dispense:  30 tablet    Refill:  0    Order Specific Question:  Supervising Provider    Answer:  Chipper Herb [[id number]]   Orders Placed This Encounter  Procedures  . Ambulatory referral to Orthopedic Surgery    Referral Priority:  Routine    Referral Type:  Surgical    Referral Reason:  Specialty Services Required    Requested Specialty:  Orthopedic Surgery    Number of Visits Requested:  1    Rest knee Wear knee wrap when up walking Elevate when sitting Ice if helps RTO prn  Mary-Margaret Hassell Done, FNP

## 2013-08-29 NOTE — Patient Instructions (Signed)
Knee Pain Knee pain can be a result of an injury or other medical conditions. Treatment will depend on the cause of your pain. HOME CARE  Only take medicine as told by your doctor.  Keep a healthy weight. Being overweight can make the knee hurt more.  Stretch before exercising or playing sports.  If there is constant knee pain, change the way you exercise. Ask your doctor for advice.  Make sure shoes fit well. Choose the right shoe for the sport or activity.  Protect your knees. Wear kneepads if needed.  Rest when you are tired. GET HELP RIGHT AWAY IF:   Your knee pain does not stop.  Your knee pain does not get better.  Your knee joint feels hot to the touch.  You have a fever. MAKE SURE YOU:   Understand these instructions.  Will watch this condition.  Will get help right away if you are not doing well or get worse. Document Released: 07/15/2008 Document Revised: 07/11/2011 Document Reviewed: 07/15/2008 [employer] Patient Information 2014 [employer].  

## 2013-09-27 ENCOUNTER — Other Ambulatory Visit: Payer: Self-pay | Admitting: Nurse Practitioner

## 2013-10-16 ENCOUNTER — Other Ambulatory Visit (INDEPENDENT_AMBULATORY_CARE_PROVIDER_SITE_OTHER): Payer: BC Managed Care – PPO

## 2013-10-16 DIAGNOSIS — R5383 Other fatigue: Secondary | ICD-10-CM

## 2013-10-16 DIAGNOSIS — I10 Essential (primary) hypertension: Secondary | ICD-10-CM

## 2013-10-16 DIAGNOSIS — E785 Hyperlipidemia, unspecified: Secondary | ICD-10-CM

## 2013-10-16 DIAGNOSIS — R5381 Other malaise: Secondary | ICD-10-CM

## 2013-10-16 DIAGNOSIS — W57XXXA Bitten or stung by nonvenomous insect and other nonvenomous arthropods, initial encounter: Secondary | ICD-10-CM

## 2013-10-16 DIAGNOSIS — R42 Dizziness and giddiness: Secondary | ICD-10-CM

## 2013-10-16 NOTE — Addendum Note (Signed)
Addended by: Pollyann Kennedy F on: 10/16/2013 09:09 AM   Modules accepted: Orders

## 2013-10-17 LAB — CMP14+EGFR
A/G RATIO: 2.2 (ref 1.1–2.5)
ALT: 16 IU/L (ref 0–44)
AST: 15 IU/L (ref 0–40)
Albumin: 4.7 g/dL (ref 3.6–4.8)
Alkaline Phosphatase: 45 IU/L (ref 39–117)
BILIRUBIN TOTAL: 0.9 mg/dL (ref 0.0–1.2)
BUN/Creatinine Ratio: 12 (ref 10–22)
BUN: 13 mg/dL (ref 8–27)
CO2: 25 mmol/L (ref 18–29)
Calcium: 10 mg/dL (ref 8.6–10.2)
Chloride: 100 mmol/L (ref 97–108)
Creatinine, Ser: 1.05 mg/dL (ref 0.76–1.27)
GFR calc non Af Amer: 76 mL/min/{1.73_m2} (ref >59)
GFR, EST AFRICAN AMERICAN: 88 mL/min/{1.73_m2} (ref >59)
Globulin, Total: 2.1 g/dL (ref 1.5–4.5)
Glucose: 95 mg/dL (ref 65–99)
POTASSIUM: 4.5 mmol/L (ref 3.5–5.2)
Sodium: 140 mmol/L (ref 134–144)
Total Protein: 6.8 g/dL (ref 6.0–8.5)

## 2013-10-17 LAB — NMR, LIPOPROFILE
CHOLESTEROL: 153 mg/dL (ref 100–199)
HDL CHOLESTEROL BY NMR: 30 mg/dL — AB (ref >39)
HDL PARTICLE NUMBER: 29.9 umol/L — AB (ref >=30.5)
LDL Particle Number: 1366 nmol/L — ABNORMAL HIGH (ref <1000)
LDL Size: 19.7 nm (ref >20.5)
LDLC SERPL CALC-MCNC: 88 mg/dL (ref 0–99)
LP-IR Score: 66 — ABNORMAL HIGH (ref <=45)
SMALL LDL PARTICLE NUMBER: 1132 nmol/L — AB (ref <=527)
TRIGLYCERIDES BY NMR: 175 mg/dL — AB (ref 0–149)

## 2013-10-18 LAB — LYME AB/WESTERN BLOT REFLEX
LYME DISEASE AB, QUANT, IGM: <0.8 index (ref 0.00–0.79)
Lyme IgG/IgM Ab: <0.91 {ISR} (ref 0.00–0.90)

## 2013-10-18 LAB — ROCKY MTN SPOTTED FVR ABS PNL(IGG+IGM)
RMSF IgG: NEGATIVE
RMSF IgM: 0.22 index (ref 0.00–0.89)

## 2013-10-19 ENCOUNTER — Telehealth: Payer: Self-pay | Admitting: Family Medicine

## 2013-10-19 NOTE — Telephone Encounter (Signed)
Message copied by Waverly Ferrari on Sat Oct 19, 2013 10:30 AM ------      Message from: [name]      Created: Fri Oct 18, 2013  6:07 PM       Lyme and RMSF negative      Kidney and liver function stable      ldl particle numbers improving      Continue current meds- low fat diet and exercise and recheck in 3 months       ------

## 2013-10-21 ENCOUNTER — Ambulatory Visit (INDEPENDENT_AMBULATORY_CARE_PROVIDER_SITE_OTHER): Payer: BC Managed Care – PPO | Admitting: Nurse Practitioner

## 2013-10-21 ENCOUNTER — Encounter: Payer: Self-pay | Admitting: Nurse Practitioner

## 2013-10-21 VITALS — BP 134/88 | HR 84 | Temp 98.2°F | Ht 68.0 in | Wt 218.0 lb

## 2013-10-21 DIAGNOSIS — Z713 Dietary counseling and surveillance: Secondary | ICD-10-CM

## 2013-10-21 DIAGNOSIS — I10 Essential (primary) hypertension: Secondary | ICD-10-CM

## 2013-10-21 DIAGNOSIS — E785 Hyperlipidemia, unspecified: Secondary | ICD-10-CM

## 2013-10-21 DIAGNOSIS — Z6833 Body mass index (BMI) 33.0-33.9, adult: Secondary | ICD-10-CM

## 2013-10-21 DIAGNOSIS — Z125 Encounter for screening for malignant neoplasm of prostate: Secondary | ICD-10-CM

## 2013-10-21 DIAGNOSIS — M109 Gout, unspecified: Secondary | ICD-10-CM

## 2013-10-21 DIAGNOSIS — M10072 Idiopathic gout, left ankle and foot: Secondary | ICD-10-CM

## 2013-10-21 MED ORDER — PRAVASTATIN SODIUM 80 MG PO TABS: 80.0000 mg | ORAL_TABLET | Freq: Every day | ORAL | Status: DC

## 2013-10-21 MED ORDER — ENALAPRIL-HYDROCHLOROTHIAZIDE 5-12.5 MG PO TABS: ORAL_TABLET | ORAL | Status: DC

## 2013-10-21 NOTE — Patient Instructions (Signed)

## 2013-10-21 NOTE — Progress Notes (Signed)
Subjective:    Patient ID: David Garza, male    DOB: 17-Nov-1950, 63 y.o.   MRN: [id number]  Patient in today for follow up- no changes since last visit- no complaints  Hypertension This is a chronic problem. The current episode started more than 1 year ago. The problem is unchanged. The problem is controlled. Pertinent negatives include no blurred vision, chest pain, headaches, malaise/fatigue, palpitations, peripheral edema or shortness of breath. There are no associated agents to hypertension. Risk factors for coronary artery disease include dyslipidemia, male gender and obesity. Past treatments include ACE inhibitors and diuretics. The current treatment provides significant improvement. Compliance problems include diet and exercise.   Hyperlipidemia This is a chronic problem. The current episode started more than 1 year ago. The problem is uncontrolled. Recent lipid tests were reviewed and are high. Exacerbating diseases include obesity. Pertinent negatives include no chest pain, leg pain, myalgias or shortness of breath. Current antihyperlipidemic treatment includes statins (can't tolerate lipitor). The current treatment provides moderate improvement of lipids. Compliance problems include adherence to diet and adherence to exercise.  Risk factors for coronary artery disease include male sex, hypertension and obesity.  Gout Patient had a flare up several months ago. Took colchicine and cleared up. No flare-ups since then    Review of Systems  Constitutional: Negative for malaise/fatigue.  Eyes: Negative for blurred vision.  Respiratory: Negative for shortness of breath.   Cardiovascular: Negative for chest pain and palpitations.  Musculoskeletal: Negative for myalgias.  Neurological: Negative for headaches.  All other systems reviewed and are negative.      Objective:   Physical Exam  Constitutional: He is oriented to person, place, and time. He appears well-developed and  well-nourished.  HENT:  Head: Normocephalic.  Right Ear: External ear normal.  Left Ear: External ear normal.  Nose: Nose normal.  Mouth/Throat: Oropharynx is clear and moist.  Eyes: EOM are normal. Pupils are equal, round, and reactive to light.  Neck: Normal range of motion. Neck supple. No thyromegaly present.  Cardiovascular: Normal rate, regular rhythm, normal heart sounds and intact distal pulses.   No murmur heard. Pulmonary/Chest: Effort normal and breath sounds normal. He has no wheezes. He has no rales.  Abdominal: Soft. Bowel sounds are normal.  Genitourinary:  Patient refuses DRE.  Musculoskeletal: Normal range of motion.  Neurological: He is alert and oriented to person, place, and time.  Skin: Skin is warm and dry.  Psychiatric: He has a normal mood and affect. His behavior is normal. Judgment and thought content normal.   BP 144/86  Pulse 84  Temp(Src) 98.2 F (36.8 C) (Oral)  Ht 5\' 8"  (1.727 m)  Wt 218 lb (98.884 kg)  BMI 33.15 kg/m2        Assessment & Plan:   1. Essential hypertension   2. Hyperlipidemia   3. Acute idiopathic gout of left foot   4. BMI 33.0-33.9,adult   5. Weight loss counseling, encounter for   6. Prostate cancer screening    Orders Placed This Encounter  Procedures  . CMP14+EGFR  . NMR, lipoprofile  . PSA, total and free   Meds ordered this encounter  Medications  . Enalapril-Hydrochlorothiazide 5-12.5 MG per tablet    Sig: TAKE 1 TABLET DAILY    Dispense:  90 tablet    Refill:  0    Order Specific Question:  Supervising Provider    Answer:  10-10-1980 [[id number]]  . pravastatin (PRAVACHOL) 80 MG tablet    Sig:  Take 1 tablet (80 mg total) by mouth daily.    Dispense:  90 tablet    Refill:  1    Order Specific Question:  Supervising Provider    Answer:  Chipper Herb [[id number]]    Labs pending Health maintenance reviewed Diet and exercise encouraged Continue all meds Follow up  In 3 months   [place],  FNP

## 2014-02-03 ENCOUNTER — Other Ambulatory Visit: Payer: Self-pay | Admitting: Family Medicine

## 2014-02-03 ENCOUNTER — Ambulatory Visit: Payer: BC Managed Care – PPO

## 2014-02-03 ENCOUNTER — Other Ambulatory Visit (INDEPENDENT_AMBULATORY_CARE_PROVIDER_SITE_OTHER): Payer: BC Managed Care – PPO

## 2014-02-03 DIAGNOSIS — Z23 Encounter for immunization: Secondary | ICD-10-CM

## 2014-02-03 DIAGNOSIS — E785 Hyperlipidemia, unspecified: Secondary | ICD-10-CM

## 2014-02-03 DIAGNOSIS — I10 Essential (primary) hypertension: Secondary | ICD-10-CM

## 2014-02-04 ENCOUNTER — Telehealth: Payer: Self-pay | Admitting: Family Medicine

## 2014-02-04 LAB — CMP14+EGFR
A/G RATIO: 2.1 (ref 1.1–2.5)
ALK PHOS: 48 IU/L (ref 39–117)
ALT: 18 IU/L (ref 0–44)
AST: 12 IU/L (ref 0–40)
Albumin: 4.6 g/dL (ref 3.6–4.8)
BUN/Creatinine Ratio: 10 (ref 10–22)
BUN: 10 mg/dL (ref 8–27)
CO2: 27 mmol/L (ref 18–29)
CREATININE: 1.01 mg/dL (ref 0.76–1.27)
Calcium: 10 mg/dL (ref 8.6–10.2)
Chloride: 100 mmol/L (ref 97–108)
GFR calc Af Amer: 92 mL/min/{1.73_m2} (ref >59)
GFR, EST NON AFRICAN AMERICAN: 80 mL/min/{1.73_m2} (ref >59)
GLOBULIN, TOTAL: 2.2 g/dL (ref 1.5–4.5)
Glucose: 101 mg/dL — ABNORMAL HIGH (ref 65–99)
Potassium: 4.5 mmol/L (ref 3.5–5.2)
SODIUM: 142 mmol/L (ref 134–144)
Total Bilirubin: 0.7 mg/dL (ref 0.0–1.2)
Total Protein: 6.8 g/dL (ref 6.0–8.5)

## 2014-02-04 LAB — NMR, LIPOPROFILE
Cholesterol: 147 mg/dL (ref 100–199)
HDL Cholesterol by NMR: 37 mg/dL — ABNORMAL LOW (ref >39)
HDL PARTICLE NUMBER: 33.1 umol/L (ref >=30.5)
LDL Particle Number: 1238 nmol/L — ABNORMAL HIGH (ref <1000)
LDL Size: 20.1 nm (ref >20.5)
LDLC SERPL CALC-MCNC: 89 mg/dL (ref 0–99)
LP-IR SCORE: 73 — AB (ref <=45)
SMALL LDL PARTICLE NUMBER: 809 nmol/L — AB (ref <=527)
Triglycerides by NMR: 107 mg/dL (ref 0–149)

## 2014-02-04 NOTE — Telephone Encounter (Signed)
Message copied by Waverly Ferrari on Tue Feb 04, 2014 10:08 AM ------      Message from: [name]      Created: Tue Feb 04, 2014  8:59 AM       Kidney and liver function stable      Cholesterol looks great      Continue current meds- low fat diet and exercise and recheck in 3 months       ------

## 2014-02-07 ENCOUNTER — Ambulatory Visit (INDEPENDENT_AMBULATORY_CARE_PROVIDER_SITE_OTHER): Payer: BC Managed Care – PPO | Admitting: Nurse Practitioner

## 2014-02-07 ENCOUNTER — Encounter: Payer: Self-pay | Admitting: Nurse Practitioner

## 2014-02-07 VITALS — BP 139/78 | HR 69 | Temp 97.7°F | Ht 68.0 in | Wt 215.4 lb

## 2014-02-07 DIAGNOSIS — I10 Essential (primary) hypertension: Secondary | ICD-10-CM

## 2014-02-07 DIAGNOSIS — M10072 Idiopathic gout, left ankle and foot: Secondary | ICD-10-CM

## 2014-02-07 DIAGNOSIS — E785 Hyperlipidemia, unspecified: Secondary | ICD-10-CM

## 2014-02-07 LAB — SPECIMEN STATUS REPORT

## 2014-02-07 MED ORDER — ENALAPRIL-HYDROCHLOROTHIAZIDE 5-12.5 MG PO TABS: ORAL_TABLET | ORAL | Status: DC

## 2014-02-07 MED ORDER — PRAVASTATIN SODIUM 80 MG PO TABS: 80.0000 mg | ORAL_TABLET | Freq: Every day | ORAL | Status: DC

## 2014-02-07 NOTE — Progress Notes (Signed)
  Subjective:    Patient ID: David Garza, male    DOB: April 27, 1951, 63 y.o.   MRN: [id number]  Patient in today for follow up- no changes since last visit- no complaints  Hyperlipidemia This is a chronic problem. The current episode started more than 1 year ago. The problem is uncontrolled. Recent lipid tests were reviewed and are high. Exacerbating diseases include obesity. Pertinent negatives include no chest pain, leg pain, myalgias or shortness of breath. Current antihyperlipidemic treatment includes statins (can't tolerate lipitor). The current treatment provides moderate improvement of lipids. Compliance problems include adherence to diet and adherence to exercise.  Risk factors for coronary artery disease include male sex, hypertension and obesity.  Hypertension This is a chronic problem. The current episode started more than 1 year ago. The problem is unchanged. The problem is controlled. Pertinent negatives include no blurred vision, chest pain, headaches, malaise/fatigue, palpitations, peripheral edema or shortness of breath. There are no associated agents to hypertension. Risk factors for coronary artery disease include dyslipidemia, male gender and obesity. Past treatments include ACE inhibitors and diuretics. The current treatment provides significant improvement. Compliance problems include diet and exercise.   Gout Patient had a flare up several months ago. Took colchicine and cleared up. No flare-ups since then    Review of Systems  Constitutional: Negative for malaise/fatigue.  Eyes: Negative for blurred vision.  Respiratory: Negative for shortness of breath.   Cardiovascular: Negative for chest pain and palpitations.  Musculoskeletal: Negative for myalgias.  Neurological: Negative for headaches.  All other systems reviewed and are negative.      Objective:   Physical Exam  Constitutional: He is oriented to person, place, and time. He appears well-developed and  well-nourished.  HENT:  Head: Normocephalic.  Right Ear: External ear normal.  Left Ear: External ear normal.  Nose: Nose normal.  Mouth/Throat: Oropharynx is clear and moist.  Eyes: EOM are normal. Pupils are equal, round, and reactive to light.  Neck: Normal range of motion. Neck supple. No thyromegaly present.  Cardiovascular: Normal rate, regular rhythm, normal heart sounds and intact distal pulses.   No murmur heard. Pulmonary/Chest: Effort normal and breath sounds normal. He has no wheezes. He has no rales.  Abdominal: Soft. Bowel sounds are normal.  Genitourinary:  Patient refuses DRE.  Musculoskeletal: Normal range of motion.  Neurological: He is alert and oriented to person, place, and time.  Skin: Skin is warm and dry.  Psychiatric: He has a normal mood and affect. His behavior is normal. Judgment and thought content normal.   BP 139/78  Pulse 69  Temp(Src) 97.7 F (36.5 C) (Oral)  Ht 5\' 8"  (1.727 m)  Wt 215 lb 6.4 oz (97.705 kg)  BMI 32.76 kg/m2        Assessment & Plan:    1. Essential hypertension Low NA+ diet - Enalapril-Hydrochlorothiazide 5-12.5 MG per tablet; TAKE 1 TABLET DAILY  Dispense: 90 tablet; Refill: 1  2. Hyperlipidemia Watch fats in diet - pravastatin (PRAVACHOL) 80 MG tablet; Take 1 tablet (80 mg total) by mouth daily.  Dispense: 90 tablet; Refill: 1  3. Acute idiopathic gout of left foot Low pruine diet   Labs pending Health maintenance reviewed Diet and exercise encouraged Continue all meds Follow up  In 3 month   [place], FNP

## 2014-02-07 NOTE — Patient Instructions (Signed)

## 2014-02-08 LAB — SPECIMEN STATUS REPORT

## 2014-02-08 LAB — PSA: PSA: 0.6 ng/mL (ref 0.0–4.0)

## 2014-02-11 ENCOUNTER — Telehealth: Payer: Self-pay | Admitting: Family Medicine

## 2014-02-11 NOTE — Telephone Encounter (Signed)
Message copied by Cline Crock on Tue Feb 11, 2014 11:42 AM ------      Message from: [name]      Created: Tue Feb 04, 2014  8:59 AM       Kidney and liver function stable      Cholesterol looks great      Continue current meds- low fat diet and exercise and recheck in 3 months       ------

## 2014-02-11 NOTE — Telephone Encounter (Signed)
Message copied by Cline Crock on Tue Feb 11, 2014 12:08 PM ------      Message from: [name]      Created: Tue Feb 04, 2014  8:59 AM       Kidney and liver function stable      Cholesterol looks great      Continue current meds- low fat diet and exercise and recheck in 3 months       ------

## 2014-02-12 ENCOUNTER — Telehealth: Payer: Self-pay | Admitting: Nurse Practitioner

## 2014-02-12 NOTE — Telephone Encounter (Signed)
Patient aware.

## 2014-02-13 ENCOUNTER — Encounter: Payer: Self-pay | Admitting: Nurse Practitioner

## 2014-02-13 ENCOUNTER — Telehealth: Payer: Self-pay | Admitting: Family Medicine

## 2014-02-13 ENCOUNTER — Ambulatory Visit (INDEPENDENT_AMBULATORY_CARE_PROVIDER_SITE_OTHER): Payer: BC Managed Care – PPO | Admitting: Nurse Practitioner

## 2014-02-13 VITALS — BP 147/87 | HR 72 | Temp 97.0°F | Ht 68.0 in | Wt 212.8 lb

## 2014-02-13 DIAGNOSIS — B029 Zoster without complications: Secondary | ICD-10-CM

## 2014-02-13 MED ORDER — VALACYCLOVIR HCL 1 G PO TABS
1000.0000 mg | ORAL_TABLET | Freq: Three times a day (TID) | ORAL | Status: DC
Start: 2014-02-13 — End: 2014-05-16

## 2014-02-13 MED ORDER — HYDROCODONE-ACETAMINOPHEN 5-325 MG PO TABS: 1.0000 | ORAL_TABLET | Freq: Four times a day (QID) | ORAL | Status: DC | PRN

## 2014-02-13 NOTE — Patient Instructions (Signed)

## 2014-02-13 NOTE — Progress Notes (Signed)
   Subjective:    Patient ID: David Garza, male    DOB: Nov 24, 1950, 63 y.o.   MRN: [id number]  HPI Patient in today c/o burning feeling on left flank- not aware of any rash.    Review of Systems  Constitutional: Negative.   HENT: Negative.   Respiratory: Negative.   Cardiovascular: Negative.   Neurological: Negative.   Psychiatric/Behavioral: Negative.   All other systems reviewed and are negative.      Objective:   Physical Exam  Constitutional: He is oriented to person, place, and time. He appears well-developed and well-nourished.  Cardiovascular: Normal rate, regular rhythm and normal heart sounds.   Pulmonary/Chest: Effort normal and breath sounds normal.  Neurological: He is alert and oriented to person, place, and time.  Skin: Skin is warm.  Erythematous vesicular rash in linear pattern aong 8th dermatome on left flank   BP 147/87  Pulse 72  Temp(Src) 97 F (36.1 C) (Oral)  Ht 5\' 8"  (1.727 m)  Wt 212 lb 12.8 oz (96.525 kg)  BMI 32.36 kg/m2        Assessment & Plan:   1. Shingles    Meds ordered this encounter  Medications  . valACYclovir (VALTREX) 1000 MG tablet    Sig: Take 1 tablet (1,000 mg total) by mouth 3 (three) times daily.    Dispense:  21 tablet    Refill:  0    Order Specific Question:  Supervising Provider    Answer:  Chipper Herb [[id number]]  . HYDROcodone-acetaminophen (NORCO/VICODIN) 5-325 MG per tablet    Sig: Take 1 tablet by mouth every 6 (six) hours as needed for moderate pain.    Dispense:  30 tablet    Refill:  0    Order Specific Question:  Supervising Provider    Answer:  Chipper Herb [[id number]]   Avoid scratching Cool compresses RTO prn  Mary-Margaret Hassell Done, FNP

## 2014-02-18 NOTE — Telephone Encounter (Signed)
done

## 2014-02-21 ENCOUNTER — Telehealth: Payer: Self-pay | Admitting: Nurse Practitioner

## 2014-02-21 DIAGNOSIS — B029 Zoster without complications: Secondary | ICD-10-CM

## 2014-02-21 MED ORDER — HYDROCODONE-ACETAMINOPHEN 5-325 MG PO TABS: 1.0000 | ORAL_TABLET | Freq: Four times a day (QID) | ORAL | Status: DC | PRN

## 2014-02-21 NOTE — Telephone Encounter (Signed)
rx ready for pickup 

## 2014-02-21 NOTE — Telephone Encounter (Signed)
Pt wife aware rx is ready to pick up.

## 2014-02-24 NOTE — Telephone Encounter (Signed)
Patient aware.

## 2014-04-16 ENCOUNTER — Ambulatory Visit: Payer: BC Managed Care – PPO

## 2014-05-12 ENCOUNTER — Other Ambulatory Visit (INDEPENDENT_AMBULATORY_CARE_PROVIDER_SITE_OTHER): Payer: BLUE CROSS/BLUE SHIELD

## 2014-05-12 DIAGNOSIS — I10 Essential (primary) hypertension: Secondary | ICD-10-CM

## 2014-05-12 DIAGNOSIS — E785 Hyperlipidemia, unspecified: Secondary | ICD-10-CM

## 2014-05-12 NOTE — Progress Notes (Signed)
Lab only 

## 2014-05-13 LAB — CMP14+EGFR
ALK PHOS: 46 IU/L (ref 39–117)
ALT: 19 IU/L (ref 0–44)
AST: 17 IU/L (ref 0–40)
Albumin/Globulin Ratio: 2 (ref 1.1–2.5)
Albumin: 4.7 g/dL (ref 3.6–4.8)
BILIRUBIN TOTAL: 1 mg/dL (ref 0.0–1.2)
BUN/Creatinine Ratio: 10 (ref 10–22)
BUN: 11 mg/dL (ref 8–27)
CHLORIDE: 102 mmol/L (ref 97–108)
CO2: 25 mmol/L (ref 18–29)
Calcium: 10.1 mg/dL (ref 8.6–10.2)
Creatinine, Ser: 1.07 mg/dL (ref 0.76–1.27)
GFR calc non Af Amer: 74 mL/min/{1.73_m2} (ref >59)
GFR, EST AFRICAN AMERICAN: 86 mL/min/{1.73_m2} (ref >59)
Globulin, Total: 2.4 g/dL (ref 1.5–4.5)
Glucose: 95 mg/dL (ref 65–99)
POTASSIUM: 4.5 mmol/L (ref 3.5–5.2)
Sodium: 142 mmol/L (ref 134–144)
TOTAL PROTEIN: 7.1 g/dL (ref 6.0–8.5)

## 2014-05-13 LAB — NMR, LIPOPROFILE
Cholesterol: 139 mg/dL (ref 100–199)
HDL Cholesterol by NMR: 36 mg/dL — ABNORMAL LOW (ref >39)
HDL Particle Number: 32.9 umol/L (ref >=30.5)
LDL PARTICLE NUMBER: 1198 nmol/L — AB (ref <1000)
LDL SIZE: 19.6 nm (ref >20.5)
LDL-C: 78 mg/dL (ref 0–99)
LP-IR Score: 73 — ABNORMAL HIGH (ref <=45)
Small LDL Particle Number: 1009 nmol/L — ABNORMAL HIGH (ref <=527)
Triglycerides by NMR: 125 mg/dL (ref 0–149)

## 2014-05-16 ENCOUNTER — Encounter: Payer: Self-pay | Admitting: Nurse Practitioner

## 2014-05-16 ENCOUNTER — Ambulatory Visit (INDEPENDENT_AMBULATORY_CARE_PROVIDER_SITE_OTHER): Payer: BLUE CROSS/BLUE SHIELD | Admitting: Nurse Practitioner

## 2014-05-16 VITALS — BP 134/82 | HR 78 | Temp 97.5°F | Ht 68.0 in | Wt 218.0 lb

## 2014-05-16 DIAGNOSIS — M25561 Pain in right knee: Secondary | ICD-10-CM

## 2014-05-16 DIAGNOSIS — Z23 Encounter for immunization: Secondary | ICD-10-CM

## 2014-05-16 DIAGNOSIS — M10072 Idiopathic gout, left ankle and foot: Secondary | ICD-10-CM

## 2014-05-16 DIAGNOSIS — I10 Essential (primary) hypertension: Secondary | ICD-10-CM

## 2014-05-16 DIAGNOSIS — E785 Hyperlipidemia, unspecified: Secondary | ICD-10-CM

## 2014-05-16 MED ORDER — METHYLPREDNISOLONE ACETATE 40 MG/ML IJ SUSP
40.0000 mg | Freq: Once | INTRAMUSCULAR | Status: AC
  Administered 2014-05-16: 40 mg via INTRA_ARTICULAR

## 2014-05-16 MED ORDER — BUPIVACAINE HCL 0.25 % IJ SOLN
1.0000 mL | Freq: Once | INTRAMUSCULAR | Status: AC
  Administered 2014-05-16: 1 mL

## 2014-05-16 NOTE — Patient Instructions (Signed)

## 2014-05-16 NOTE — Progress Notes (Signed)
Subjective:    Patient ID: David Garza, male    DOB: 03/05/1951, 64 y.o.   MRN: [id number]  Patient in today for follow up- no changes since last visit- Patient c/o right knee pain- had it injected over a year ago and that seemed to help a lot- would like done again.  Hyperlipidemia This is a chronic problem. The current episode started more than 1 year ago. The problem is controlled. Recent lipid tests were reviewed and are variable. Pertinent negatives include no chest pain, myalgias or shortness of breath. Current antihyperlipidemic treatment includes statins. The current treatment provides moderate improvement of lipids. Compliance problems include adherence to diet and adherence to exercise.   Hypertension This is a chronic problem. The current episode started more than 1 year ago. The problem is unchanged. The problem is controlled. Pertinent negatives include no chest pain, headaches, palpitations or shortness of breath. Risk factors for coronary artery disease include dyslipidemia, obesity and male gender. Past treatments include ACE inhibitors and diuretics. The current treatment provides moderate improvement. Compliance problems include diet and exercise.   Gout Patient had a flare up several months ago. Took colchicine and cleared up. No flare-ups since then    Review of Systems  Respiratory: Negative for shortness of breath.   Cardiovascular: Negative for chest pain and palpitations.  Musculoskeletal: Negative for myalgias.  Neurological: Negative for headaches.  All other systems reviewed and are negative.      Objective:   Physical Exam  Constitutional: He is oriented to person, place, and time. He appears well-developed and well-nourished.  HENT:  Head: Normocephalic.  Right Ear: External ear normal.  Left Ear: External ear normal.  Nose: Nose normal.  Mouth/Throat: Oropharynx is clear and moist.  Eyes: EOM are normal. Pupils are equal, round, and reactive to  light.  Neck: Normal range of motion. Neck supple. No thyromegaly present.  Cardiovascular: Normal rate, regular rhythm, normal heart sounds and intact distal pulses.   No murmur heard. Pulmonary/Chest: Effort normal and breath sounds normal. He has no wheezes. He has no rales.  Abdominal: Soft. Bowel sounds are normal.  Genitourinary:  Patient refuses DRE.  Musculoskeletal: Normal range of motion.  FROM of right kneewith crepitus on flexion and extension No effusion all ligaments intact  Neurological: He is alert and oriented to person, place, and time.  Skin: Skin is warm and dry.  Psychiatric: He has a normal mood and affect. His behavior is normal. Judgment and thought content normal.   BP 134/82 mmHg  Pulse 78  Temp(Src) 97.5 F (36.4 C) (Oral)  Ht $R'5\' 8"'Fs$  (1.727 m)  Wt 218 lb (98.884 kg)  BMI 33.15 kg/m2   Right knee injection- cleaned with betadine                                   Marcaine 0.25% plain 1cc with depomedrol $RemoveBeforeD'40mg'JzEmjeVzqWAPSC$ /ml 55ml- 20g 1 1/2 needle                                  * done under sterile technique     Assessment & Plan:    1. Right knee pain Knee injection Ice BID for several days Rest as much as possib;e - bupivacaine (MARCAINE) 0.25 % (with pres) injection 1 mL; 1 mL by Infiltration route once. - methylPREDNISolone acetate (DEPO-MEDROL) injection 40 mg;  Inject 1 mL (40 mg total) into the articular space once.  2. Essential hypertension Do not add salt to diet - CMP14+EGFR  3. Acute idiopathic gout of left foot  4. Hyperlipidemia Low fat diet - NMR, lipoprofile   Shingles vaccine Labs pending Health maintenance reviewed Diet and exercise encouraged Continue all meds Follow up  In 3 month   [place], FNP

## 2014-08-19 ENCOUNTER — Other Ambulatory Visit: Payer: Self-pay | Admitting: Nurse Practitioner

## 2014-08-22 ENCOUNTER — Other Ambulatory Visit: Payer: Self-pay | Admitting: *Deleted

## 2014-08-22 ENCOUNTER — Other Ambulatory Visit (INDEPENDENT_AMBULATORY_CARE_PROVIDER_SITE_OTHER): Payer: BLUE CROSS/BLUE SHIELD

## 2014-08-22 DIAGNOSIS — E785 Hyperlipidemia, unspecified: Secondary | ICD-10-CM

## 2014-08-22 DIAGNOSIS — I10 Essential (primary) hypertension: Secondary | ICD-10-CM

## 2014-08-22 NOTE — Progress Notes (Signed)
Lab only 

## 2014-08-23 LAB — CMP14+EGFR
ALK PHOS: 42 IU/L (ref 39–117)
ALT: 17 IU/L (ref 0–44)
AST: 17 IU/L (ref 0–40)
Albumin/Globulin Ratio: 2 (ref 1.1–2.5)
Albumin: 4.5 g/dL (ref 3.6–4.8)
BUN / CREAT RATIO: 11 (ref 10–22)
BUN: 12 mg/dL (ref 8–27)
Bilirubin Total: 1.1 mg/dL (ref 0.0–1.2)
CO2: 24 mmol/L (ref 18–29)
Calcium: 9.8 mg/dL (ref 8.6–10.2)
Chloride: 98 mmol/L (ref 97–108)
Creatinine, Ser: 1.08 mg/dL (ref 0.76–1.27)
GFR calc Af Amer: 85 mL/min/{1.73_m2} (ref >59)
GFR calc non Af Amer: 73 mL/min/{1.73_m2} (ref >59)
Globulin, Total: 2.3 g/dL (ref 1.5–4.5)
Glucose: 95 mg/dL (ref 65–99)
Potassium: 4.1 mmol/L (ref 3.5–5.2)
SODIUM: 140 mmol/L (ref 134–144)
Total Protein: 6.8 g/dL (ref 6.0–8.5)

## 2014-08-23 LAB — NMR, LIPOPROFILE
Cholesterol: 148 mg/dL (ref 100–199)
HDL CHOLESTEROL BY NMR: 34 mg/dL — AB (ref >39)
HDL Particle Number: 30.8 umol/L (ref >=30.5)
LDL PARTICLE NUMBER: 1385 nmol/L — AB (ref <1000)
LDL SIZE: 20 nm (ref >20.5)
LDL-C: 86 mg/dL (ref 0–99)
LP-IR Score: 61 — ABNORMAL HIGH (ref <=45)
Small LDL Particle Number: 932 nmol/L — ABNORMAL HIGH (ref <=527)
TRIGLYCERIDES BY NMR: 138 mg/dL (ref 0–149)

## 2014-08-27 ENCOUNTER — Encounter: Payer: Self-pay | Admitting: Nurse Practitioner

## 2014-08-27 ENCOUNTER — Ambulatory Visit (INDEPENDENT_AMBULATORY_CARE_PROVIDER_SITE_OTHER): Payer: BLUE CROSS/BLUE SHIELD | Admitting: Nurse Practitioner

## 2014-08-27 ENCOUNTER — Other Ambulatory Visit: Payer: Self-pay | Admitting: *Deleted

## 2014-08-27 VITALS — BP 137/90 | HR 75 | Temp 97.0°F | Ht 68.0 in | Wt 213.0 lb

## 2014-08-27 DIAGNOSIS — M10072 Idiopathic gout, left ankle and foot: Secondary | ICD-10-CM

## 2014-08-27 DIAGNOSIS — K219 Gastro-esophageal reflux disease without esophagitis: Secondary | ICD-10-CM

## 2014-08-27 DIAGNOSIS — I10 Essential (primary) hypertension: Secondary | ICD-10-CM | POA: Diagnosis not present

## 2014-08-27 DIAGNOSIS — E785 Hyperlipidemia, unspecified: Secondary | ICD-10-CM

## 2014-08-27 MED ORDER — OMEPRAZOLE 40 MG PO CPDR: 40.0000 mg | DELAYED_RELEASE_CAPSULE | Freq: Every day | ORAL | Status: DC

## 2014-08-27 MED ORDER — ENALAPRIL-HYDROCHLOROTHIAZIDE 5-12.5 MG PO TABS: 1.0000 | ORAL_TABLET | Freq: Every day | ORAL | Status: DC

## 2014-08-27 MED ORDER — PRAVASTATIN SODIUM 80 MG PO TABS: 80.0000 mg | ORAL_TABLET | Freq: Every day | ORAL | Status: DC

## 2014-08-27 NOTE — Patient Instructions (Signed)
Fat and Cholesterol Control Diet Fat and cholesterol levels in your blood and organs are influenced by your diet. High levels of fat and cholesterol may lead to diseases of the heart, small and large blood vessels, gallbladder, liver, and pancreas. CONTROLLING FAT AND CHOLESTEROL WITH DIET Although exercise and lifestyle factors are important, your diet is key. That is because certain foods are known to raise cholesterol and others to lower it. The goal is to balance foods for their effect on cholesterol and more importantly, to replace saturated and trans fat with other types of fat, such as monounsaturated fat, polyunsaturated fat, and omega-3 fatty acids. On average, a person should consume no more than 15 to 17 g of saturated fat daily. Saturated and trans fats are considered "bad" fats, and they will raise LDL cholesterol. Saturated fats are primarily found in animal products such as meats, butter, and cream. However, that does not mean you need to give up all your favorite foods. Today, there are good tasting, low-fat, low-cholesterol substitutes for most of the things you like to eat. Choose low-fat or nonfat alternatives. Choose round or loin cuts of red meat. These types of cuts are lowest in fat and cholesterol. Chicken (without the skin), fish, veal, and ground turkey breast are great choices. Eliminate fatty meats, such as hot dogs and salami. Even shellfish have little or no saturated fat. Have a 3 oz (85 g) portion when you eat lean meat, poultry, or fish. Trans fats are also called "partially hydrogenated oils." They are oils that have been scientifically manipulated so that they are solid at room temperature resulting in a longer shelf life and improved taste and texture of foods in which they are added. Trans fats are found in stick margarine, some tub margarines, cookies, crackers, and baked goods.  When baking and cooking, oils are a great substitute for butter. The monounsaturated oils are  especially beneficial since it is believed they lower LDL and raise HDL. The oils you should avoid entirely are saturated tropical oils, such as coconut and palm.  Remember to eat a lot from food groups that are naturally free of saturated and trans fat, including fish, fruit, vegetables, beans, grains (barley, rice, couscous, bulgur wheat), and pasta (without cream sauces).  IDENTIFYING FOODS THAT LOWER FAT AND CHOLESTEROL  Soluble fiber may lower your cholesterol. This type of fiber is found in fruits such as apples, vegetables such as broccoli, potatoes, and carrots, legumes such as beans, peas, and lentils, and grains such as barley. Foods fortified with plant sterols (phytosterol) may also lower cholesterol. You should eat at least 2 g per day of these foods for a cholesterol lowering effect.  Read package labels to identify low-saturated fats, trans fat free, and low-fat foods at the supermarket. Select cheeses that have only 2 to 3 g saturated fat per ounce. Use a heart-healthy tub margarine that is free of trans fats or partially hydrogenated oil. When buying baked goods (cookies, crackers), avoid partially hydrogenated oils. Breads and muffins should be made from whole grains (whole-wheat or whole oat flour, instead of "flour" or "enriched flour"). Buy non-creamy canned soups with reduced salt and no added fats.  FOOD PREPARATION TECHNIQUES  Never deep-fry. If you must fry, either stir-fry, which uses very little fat, or use non-stick cooking sprays. When possible, broil, bake, or roast meats, and steam vegetables. Instead of putting butter or margarine on vegetables, use lemon and herbs, applesauce, and cinnamon (for squash and sweet potatoes). Use nonfat   yogurt, salsa, and low-fat dressings for salads.  LOW-SATURATED FAT / LOW-FAT FOOD SUBSTITUTES Meats / Saturated Fat (g)  Avoid: Steak, marbled (3 oz/85 g) / 11 g  Choose: Steak, lean (3 oz/85 g) / 4 g  Avoid: Hamburger (3 oz/85 g) / 7  g  Choose: Hamburger, lean (3 oz/85 g) / 5 g  Avoid: Ham (3 oz/85 g) / 6 g  Choose: Ham, lean cut (3 oz/85 g) / 2.4 g  Avoid: Chicken, with skin, dark meat (3 oz/85 g) / 4 g  Choose: Chicken, skin removed, dark meat (3 oz/85 g) / 2 g  Avoid: Chicken, with skin, light meat (3 oz/85 g) / 2.5 g  Choose: Chicken, skin removed, light meat (3 oz/85 g) / 1 g Dairy / Saturated Fat (g)  Avoid: Whole milk (1 cup) / 5 g  Choose: Low-fat milk, 2% (1 cup) / 3 g  Choose: Low-fat milk, 1% (1 cup) / 1.5 g  Choose: Skim milk (1 cup) / 0.3 g  Avoid: Hard cheese (1 oz/28 g) / 6 g  Choose: Skim milk cheese (1 oz/28 g) / 2 to 3 g  Avoid: Cottage cheese, 4% fat (1 cup) / 6.5 g  Choose: Low-fat cottage cheese, 1% fat (1 cup) / 1.5 g  Avoid: Ice cream (1 cup) / 9 g  Choose: Sherbet (1 cup) / 2.5 g  Choose: Nonfat frozen yogurt (1 cup) / 0.3 g  Choose: Frozen fruit bar / trace  Avoid: Whipped cream (1 tbs) / 3.5 g  Choose: Nondairy whipped topping (1 tbs) / 1 g Condiments / Saturated Fat (g)  Avoid: Mayonnaise (1 tbs) / 2 g  Choose: Low-fat mayonnaise (1 tbs) / 1 g  Avoid: Butter (1 tbs) / 7 g  Choose: Extra light margarine (1 tbs) / 1 g  Avoid: Coconut oil (1 tbs) / 11.8 g  Choose: Olive oil (1 tbs) / 1.8 g  Choose: Corn oil (1 tbs) / 1.7 g  Choose: Safflower oil (1 tbs) / 1.2 g  Choose: Sunflower oil (1 tbs) / 1.4 g  Choose: Soybean oil (1 tbs) / 2.4 g  Choose: Canola oil (1 tbs) / 1 g Document Released: 04/18/2005 Document Revised: 08/13/2012 Document Reviewed: 07/17/2013 Exit[employer] Patient Information 2015 [employer]. This information is not intended to replace advice given to you by your health care provider. Make sure you discuss any questions you have with your health care provider.  

## 2014-08-27 NOTE — Progress Notes (Signed)
  Subjective:    Patient ID: David Garza, male    DOB: 30-Apr-1951, 64 y.o.   MRN: [id number]  Patient in today for follow up- no changes since last visit- Had knee injected at last visit and is doing better  Gastrophageal Reflux He reports no chest pain.  Hyperlipidemia This is a chronic problem. The current episode started more than 1 year ago. The problem is controlled. Recent lipid tests were reviewed and are variable. Pertinent negatives include no chest pain, myalgias or shortness of breath. Current antihyperlipidemic treatment includes statins. The current treatment provides moderate improvement of lipids. Compliance problems include adherence to diet and adherence to exercise.   Hypertension This is a chronic problem. The current episode started more than 1 year ago. The problem is unchanged. The problem is controlled. Pertinent negatives include no chest pain, headaches, palpitations or shortness of breath. Risk factors for coronary artery disease include dyslipidemia, obesity and male gender. Past treatments include ACE inhibitors and diuretics. The current treatment provides moderate improvement. Compliance problems include diet and exercise.   Gout Patient had a flare up several months ago. Took colchicine and cleared up. No flare-ups since then    Review of Systems  Constitutional: Negative.   HENT: Negative.   Respiratory: Negative for shortness of breath.   Cardiovascular: Negative for chest pain and palpitations.  Genitourinary: Negative.   Musculoskeletal: Negative for myalgias.  Neurological: Negative for headaches.  Psychiatric/Behavioral: Negative.   All other systems reviewed and are negative.      Objective:   Physical Exam  Constitutional: He is oriented to person, place, and time. He appears well-developed and well-nourished.  HENT:  Head: Normocephalic.  Right Ear: External ear normal.  Left Ear: External ear normal.  Nose: Nose normal.  Mouth/Throat:  Oropharynx is clear and moist.  Eyes: EOM are normal. Pupils are equal, round, and reactive to light.  Neck: Normal range of motion. Neck supple. No thyromegaly present.  Cardiovascular: Normal rate, regular rhythm, normal heart sounds and intact distal pulses.   No murmur heard. Pulmonary/Chest: Effort normal and breath sounds normal. He has no wheezes. He has no rales.  Abdominal: Soft. Bowel sounds are normal.  Genitourinary:  Patient refuses DRE.  Musculoskeletal: Normal range of motion.  Neurological: He is alert and oriented to person, place, and time.  Skin: Skin is warm and dry.  Psychiatric: He has a normal mood and affect. His behavior is normal. Judgment and thought content normal.   BP 137/90 mmHg  Pulse 75  Temp(Src) 97 F (36.1 C) (Oral)  Ht 5\' 8"  (1.727 m)  Wt 213 lb (96.616 kg)  BMI 32.39 kg/m2       Assessment & Plan:    1. Hyperlipidemia Low fat diet - pravastatin (PRAVACHOL) 80 MG tablet; Take 1 tablet (80 mg total) by mouth daily.  Dispense: 90 tablet; Refill: 1  2. Essential hypertension Do not add salt to diet - Enalapril-Hydrochlorothiazide 5-12.5 MG per tablet; Take 1 tablet by mouth daily.  Dispense: 90 tablet; Refill: 1  3. Acute idiopathic gout of left foot 4. Gastroesophageal reflux disease without esophagitis Avoid spicy foods Do not eat 2 hours prior to bedtime - omeprazole (PRILOSEC) 40 MG capsule; Take 1 capsule (40 mg total) by mouth daily.  Dispense: 90 capsule; Refill: 1    Labs pending Health maintenance reviewed Diet and exercise encouraged Continue all meds Follow up  In 3 months   [place], FNP

## 2014-10-13 ENCOUNTER — Ambulatory Visit (INDEPENDENT_AMBULATORY_CARE_PROVIDER_SITE_OTHER): Payer: BLUE CROSS/BLUE SHIELD | Admitting: Physician Assistant

## 2014-10-13 ENCOUNTER — Encounter: Payer: Self-pay | Admitting: Physician Assistant

## 2014-10-13 VITALS — Temp 98.3°F | Ht 68.0 in | Wt 215.0 lb

## 2014-10-13 DIAGNOSIS — T148 Other injury of unspecified body region: Secondary | ICD-10-CM | POA: Diagnosis not present

## 2014-10-13 DIAGNOSIS — R21 Rash and other nonspecific skin eruption: Secondary | ICD-10-CM

## 2014-10-13 DIAGNOSIS — W57XXXA Bitten or stung by nonvenomous insect and other nonvenomous arthropods, initial encounter: Secondary | ICD-10-CM | POA: Diagnosis not present

## 2014-10-13 MED ORDER — DOXYCYCLINE HYCLATE 100 MG PO TABS: 100.0000 mg | ORAL_TABLET | Freq: Two times a day (BID) | ORAL | Status: DC

## 2014-10-13 NOTE — Patient Instructions (Signed)
Tick Bite Information Ticks are insects that attach themselves to the skin and draw blood for food. There are various types of ticks. Common types include wood ticks and deer ticks. Most ticks live in shrubs and grassy areas. Ticks can climb onto your body when you make contact with leaves or grass where the tick is waiting. The most common places on the body for ticks to attach themselves are the scalp, neck, armpits, waist, and groin. Most tick bites are harmless, but sometimes ticks carry germs that cause diseases. These germs can be spread to a person during the tick's feeding process. The chance of a disease spreading through a tick bite depends on:   The type of tick.  Time of year.   How long the tick is attached.   Geographic location.  HOW CAN YOU PREVENT TICK BITES? Take these steps to help prevent tick bites when you are outdoors:  Wear protective clothing. Long sleeves and long pants are best.   Wear white clothes so you can see ticks more easily.  Tuck your pant legs into your socks.   If walking on a trail, stay in the middle of the trail to avoid brushing against bushes.  Avoid walking through areas with long grass.  Put insect repellent on all exposed skin and along boot tops, pant legs, and sleeve cuffs.   Check clothing, hair, and skin repeatedly and before going inside.   Brush off any ticks that are not attached.  Take a shower or bath as soon as possible after being outdoors.  WHAT IS THE PROPER WAY TO REMOVE A TICK? Ticks should be removed as soon as possible to help prevent diseases caused by tick bites. 1. If latex gloves are available, put them on before trying to remove a tick.  2. Using fine-point tweezers, grasp the tick as close to the skin as possible. You may also use curved forceps or a tick removal tool. Grasp the tick as close to its head as possible. Avoid grasping the tick on its body. 3. Pull gently with steady upward pressure until  the tick lets go. Do not twist the tick or jerk it suddenly. This may break off the tick's head or mouth parts. 4. Do not squeeze or crush the tick's body. This could force disease-carrying fluids from the tick into your body.  5. After the tick is removed, wash the bite area and your hands with soap and water or other disinfectant such as alcohol. 6. Apply a small amount of antiseptic cream or ointment to the bite site.  7. Wash and disinfect any instruments that were used.  Do not try to remove a tick by applying a hot match, petroleum jelly, or fingernail polish to the tick. These methods do not work and may increase the chances of disease being spread from the tick bite.  WHEN SHOULD YOU SEEK MEDICAL CARE? Contact your health care provider if you are unable to remove a tick from your skin or if a part of the tick breaks off and is stuck in the skin.  After a tick bite, you need to be aware of signs and symptoms that could be related to diseases spread by ticks. Contact your health care provider if you develop any of the following in the days or weeks after the tick bite:  Unexplained fever.  Rash. A circular rash that appears days or weeks after the tick bite may indicate the possibility of Lyme disease. The rash may resemble   a target with a bull's-eye and may occur at a different part of your body than the tick bite.  Redness and swelling in the area of the tick bite.   Tender, swollen lymph glands.   Diarrhea.   Weight loss.   Cough.   Fatigue.   Muscle, joint, or bone pain.   Abdominal pain.   Headache.   Lethargy or a change in your level of consciousness.  Difficulty walking or moving your legs.   Numbness in the legs.   Paralysis.  Shortness of breath.   Confusion.   Repeated vomiting.  Document Released: 04/15/2000 Document Revised: 02/06/2013 Document Reviewed: 09/26/2012 Exit[employer] Patient Information 2015 [employer]. This information is  not intended to replace advice given to you by your health care provider. Make sure you discuss any questions you have with your health care provider.  

## 2014-10-13 NOTE — Progress Notes (Signed)
   Subjective:    Patient ID: David Garza, male    DOB: [birth year], 64 y.o.   MRN: [id number]  HPI 64 y/omale presents with c/o lesion on right distal leg x 2 weeks. He is not sure if he was bit by something but the lesion is becoming larger. Denies pain. Has been putting alcohol on it with some relief. 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    Review of Systems  Constitutional: Negative for fever, chills, diaphoresis and fatigue.  Respiratory: Negative.   Gastrointestinal: Negative.   Skin: Negative for rash.       Enlarging lesion on right leg/ calf x 2 weeks   Neurological: Negative for light-headedness and headaches.       Objective:   Physical Exam  Constitutional: He is oriented to person, place, and time. He appears well-developed and well-nourished.  Pulmonary/Chest: Effort normal.  Musculoskeletal: He exhibits no edema or tenderness.  Neurological: He is alert and oriented to person, place, and time.  Skin: There is erythema (erythematous localized annular macular lesion on right medial calf with marked borders. Mild central clearing resembling erythema migrans. Negative for rash elsewhere).  Psychiatric: He has a normal mood and affect. His behavior is normal. Judgment and thought content normal.  Nursing note and vitals reviewed.         Assessment & Plan:  1. Tick bite  - doxycycline (VIBRA-TABS) 100 MG tablet; Take 1 tablet (100 mg total) by mouth 2 (two) times daily.  Dispense: 28 tablet; Refill: 0 - Lyme Ab/Western Blot Reflex - Rocky mtn spotted fvr abs pnl(IgG+IgM)  2. Rash and nonspecific skin eruption  - doxycycline (VIBRA-TABS) 100 MG tablet; Take 1 tablet (100 mg total) by mouth 2 (two) times daily.  Dispense: 28 tablet; Refill: 0 - Lyme Ab/Western Blot Reflex - Rocky mtn spotted fvr abs pnl(IgG+IgM)   Continue all meds Labs pending Health Maintenance  reviewed Diet and exercise encouraged RTO 2 weeks   Tadd Holtmeyer A. Benjamin Stain PA-C

## 2014-10-15 LAB — ROCKY MTN SPOTTED FVR ABS PNL(IGG+IGM)
RMSF IGG: NEGATIVE
RMSF IGM: 0.28 {index} (ref 0.00–0.89)

## 2014-10-15 LAB — LYME AB/WESTERN BLOT REFLEX: LYME DISEASE AB, QUANT, IGM: <0.8 index (ref 0.00–0.79)

## 2014-10-27 ENCOUNTER — Ambulatory Visit: Payer: BLUE CROSS/BLUE SHIELD | Admitting: Physician Assistant

## 2014-12-01 ENCOUNTER — Other Ambulatory Visit: Payer: BLUE CROSS/BLUE SHIELD

## 2014-12-01 DIAGNOSIS — E785 Hyperlipidemia, unspecified: Secondary | ICD-10-CM

## 2014-12-02 LAB — LIPID PANEL
CHOL/HDL RATIO: 4.8 ratio (ref 0.0–5.0)
CHOLESTEROL TOTAL: 152 mg/dL (ref 100–199)
HDL: 32 mg/dL — AB (ref >39)
LDL Calculated: 86 mg/dL (ref 0–99)
Triglycerides: 172 mg/dL — ABNORMAL HIGH (ref 0–149)
VLDL Cholesterol Cal: 34 mg/dL (ref 5–40)

## 2014-12-02 LAB — HEPATIC FUNCTION PANEL
ALBUMIN: 4.5 g/dL (ref 3.6–4.8)
ALK PHOS: 46 IU/L (ref 39–117)
ALT: 18 IU/L (ref 0–44)
AST: 13 IU/L (ref 0–40)
BILIRUBIN TOTAL: 0.7 mg/dL (ref 0.0–1.2)
Bilirubin, Direct: 0.19 mg/dL (ref 0.00–0.40)
Total Protein: 6.7 g/dL (ref 6.0–8.5)

## 2014-12-04 ENCOUNTER — Encounter: Payer: Self-pay | Admitting: Nurse Practitioner

## 2014-12-04 ENCOUNTER — Ambulatory Visit (INDEPENDENT_AMBULATORY_CARE_PROVIDER_SITE_OTHER): Payer: BLUE CROSS/BLUE SHIELD | Admitting: Nurse Practitioner

## 2014-12-04 ENCOUNTER — Ambulatory Visit (INDEPENDENT_AMBULATORY_CARE_PROVIDER_SITE_OTHER): Payer: BLUE CROSS/BLUE SHIELD

## 2014-12-04 VITALS — BP 142/88 | HR 65 | Temp 96.7°F | Ht 68.0 in | Wt 216.8 lb

## 2014-12-04 DIAGNOSIS — M10072 Idiopathic gout, left ankle and foot: Secondary | ICD-10-CM

## 2014-12-04 DIAGNOSIS — Z87891 Personal history of nicotine dependence: Secondary | ICD-10-CM

## 2014-12-04 DIAGNOSIS — E785 Hyperlipidemia, unspecified: Secondary | ICD-10-CM

## 2014-12-04 DIAGNOSIS — Z720 Tobacco use: Secondary | ICD-10-CM

## 2014-12-04 DIAGNOSIS — I10 Essential (primary) hypertension: Secondary | ICD-10-CM

## 2014-12-04 MED ORDER — PRAVASTATIN SODIUM 80 MG PO TABS: 80.0000 mg | ORAL_TABLET | Freq: Every day | ORAL | Status: DC

## 2014-12-04 MED ORDER — ENALAPRIL-HYDROCHLOROTHIAZIDE 5-12.5 MG PO TABS: 1.0000 | ORAL_TABLET | Freq: Every day | ORAL | Status: DC

## 2014-12-04 NOTE — Progress Notes (Signed)
  Subjective:    Patient ID: David Garza, male    DOB: 29-Feb-1951, 64 y.o.   MRN: [id number]  Patient in today for follow up- no changes since last visit-   Gastrophageal Reflux He reports no chest pain.  Hyperlipidemia This is a chronic problem. The current episode started more than 1 year ago. The problem is controlled. Recent lipid tests were reviewed and are variable. Pertinent negatives include no chest pain, myalgias or shortness of breath. Current antihyperlipidemic treatment includes statins. The current treatment provides moderate improvement of lipids. Compliance problems include adherence to diet and adherence to exercise.   Hypertension This is a chronic problem. The current episode started more than 1 year ago. The problem is unchanged. The problem is controlled. Pertinent negatives include no chest pain, headaches, palpitations or shortness of breath. Risk factors for coronary artery disease include dyslipidemia, obesity and male gender. Past treatments include ACE inhibitors and diuretics. The current treatment provides moderate improvement. Compliance problems include diet and exercise.   Gout Patient had a flare up several months ago. Took colchicine and cleared up. No flare-ups since then    Review of Systems  Constitutional: Negative.   HENT: Negative.   Respiratory: Negative for shortness of breath.   Cardiovascular: Negative for chest pain and palpitations.  Genitourinary: Negative.   Musculoskeletal: Negative for myalgias.  Neurological: Negative for headaches.  Psychiatric/Behavioral: Negative.   All other systems reviewed and are negative.      Objective:   Physical Exam  Constitutional: He is oriented to person, place, and time. He appears well-developed and well-nourished.  HENT:  Head: Normocephalic.  Right Ear: External ear normal.  Left Ear: External ear normal.  Nose: Nose normal.  Mouth/Throat: Oropharynx is clear and moist.  Eyes: EOM are  normal. Pupils are equal, round, and reactive to light.  Neck: Normal range of motion. Neck supple. No thyromegaly present.  Cardiovascular: Normal rate, regular rhythm, normal heart sounds and intact distal pulses.   No murmur heard. Pulmonary/Chest: Effort normal and breath sounds normal. He has no wheezes. He has no rales.  Abdominal: Soft. Bowel sounds are normal.  Genitourinary:  Patient refuses DRE.  Musculoskeletal: Normal range of motion.  Neurological: He is alert and oriented to person, place, and time.  Skin: Skin is warm and dry.  Psychiatric: He has a normal mood and affect. His behavior is normal. Judgment and thought content normal.   BP 142/88 mmHg  Pulse 65  Temp(Src) 96.7 F (35.9 C) (Oral)  Ht $R'5\' 8"'eA$  (1.727 m)  Wt 216 lb 12.8 oz (98.34 kg)  BMI 32.97 kg/m2  Chest x ray- mild bronchitic changes otherwise normal-Preliminary reading by Ronnald Collum, FNP  [facility]   EKG- Kerry Hough, FNP       Assessment & Plan:   1. Essential hypertension Do not add salt to iet - EKG 12-Lead - CMP14+EGFR  2. Hyperlipidemia Low fat diet - Lipid panel  3. Acute idiopathic gout of left foot  4. Hx of smoking - DG Chest 2 View; Future    Labs results reviewed Health maintenance reviewed Diet and exercise encouraged Continue all meds Follow up  In 3 months   [place], FNP

## 2014-12-04 NOTE — Patient Instructions (Signed)

## 2014-12-05 LAB — BMP8+EGFR
BUN/Creatinine Ratio: 12 (ref 10–22)
BUN: 13 mg/dL (ref 8–27)
CO2: 23 mmol/L (ref 18–29)
CREATININE: 1.07 mg/dL (ref 0.76–1.27)
Calcium: 9.7 mg/dL (ref 8.6–10.2)
Chloride: 97 mmol/L (ref 97–108)
GFR calc Af Amer: 86 mL/min/{1.73_m2} (ref >59)
GFR calc non Af Amer: 74 mL/min/{1.73_m2} (ref >59)
Glucose: 101 mg/dL — ABNORMAL HIGH (ref 65–99)
Potassium: 4 mmol/L (ref 3.5–5.2)
Sodium: 140 mmol/L (ref 134–144)

## 2014-12-05 LAB — SPECIMEN STATUS REPORT

## 2015-01-29 ENCOUNTER — Ambulatory Visit (INDEPENDENT_AMBULATORY_CARE_PROVIDER_SITE_OTHER): Payer: BLUE CROSS/BLUE SHIELD

## 2015-01-29 DIAGNOSIS — Z23 Encounter for immunization: Secondary | ICD-10-CM

## 2015-02-04 ENCOUNTER — Other Ambulatory Visit: Payer: Self-pay | Admitting: Nurse Practitioner

## 2015-03-04 ENCOUNTER — Other Ambulatory Visit: Payer: BLUE CROSS/BLUE SHIELD

## 2015-03-04 DIAGNOSIS — E785 Hyperlipidemia, unspecified: Secondary | ICD-10-CM

## 2015-03-04 NOTE — Progress Notes (Signed)
Lab only 

## 2015-03-05 LAB — LIPID PANEL
CHOL/HDL RATIO: 4 ratio (ref 0.0–5.0)
Cholesterol, Total: 141 mg/dL (ref 100–199)
HDL: 35 mg/dL — ABNORMAL LOW (ref >39)
LDL Calculated: 77 mg/dL (ref 0–99)
Triglycerides: 145 mg/dL (ref 0–149)
VLDL Cholesterol Cal: 29 mg/dL (ref 5–40)

## 2015-03-05 LAB — HEPATIC FUNCTION PANEL
ALBUMIN: 4.5 g/dL (ref 3.6–4.8)
ALT: 22 IU/L (ref 0–44)
AST: 18 IU/L (ref 0–40)
Alkaline Phosphatase: 45 IU/L (ref 39–117)
BILIRUBIN TOTAL: 1 mg/dL (ref 0.0–1.2)
BILIRUBIN, DIRECT: 0.22 mg/dL (ref 0.00–0.40)
TOTAL PROTEIN: 6.7 g/dL (ref 6.0–8.5)

## 2015-03-06 ENCOUNTER — Encounter: Payer: Self-pay | Admitting: Nurse Practitioner

## 2015-03-06 ENCOUNTER — Ambulatory Visit (INDEPENDENT_AMBULATORY_CARE_PROVIDER_SITE_OTHER): Payer: BLUE CROSS/BLUE SHIELD | Admitting: Nurse Practitioner

## 2015-03-06 VITALS — BP 134/91 | HR 69 | Temp 97.0°F | Ht 68.0 in | Wt 216.0 lb

## 2015-03-06 DIAGNOSIS — K219 Gastro-esophageal reflux disease without esophagitis: Secondary | ICD-10-CM | POA: Diagnosis not present

## 2015-03-06 DIAGNOSIS — Z6833 Body mass index (BMI) 33.0-33.9, adult: Secondary | ICD-10-CM | POA: Insufficient documentation

## 2015-03-06 DIAGNOSIS — Z125 Encounter for screening for malignant neoplasm of prostate: Secondary | ICD-10-CM

## 2015-03-06 DIAGNOSIS — Z6832 Body mass index (BMI) 32.0-32.9, adult: Secondary | ICD-10-CM | POA: Diagnosis not present

## 2015-03-06 DIAGNOSIS — Z1159 Encounter for screening for other viral diseases: Secondary | ICD-10-CM

## 2015-03-06 DIAGNOSIS — I10 Essential (primary) hypertension: Secondary | ICD-10-CM | POA: Diagnosis not present

## 2015-03-06 DIAGNOSIS — M10072 Idiopathic gout, left ankle and foot: Secondary | ICD-10-CM

## 2015-03-06 DIAGNOSIS — E785 Hyperlipidemia, unspecified: Secondary | ICD-10-CM

## 2015-03-06 MED ORDER — OMEPRAZOLE 40 MG PO CPDR: 40.0000 mg | DELAYED_RELEASE_CAPSULE | Freq: Every day | ORAL | Status: DC

## 2015-03-06 MED ORDER — ENALAPRIL-HYDROCHLOROTHIAZIDE 5-12.5 MG PO TABS: 1.0000 | ORAL_TABLET | Freq: Every day | ORAL | Status: DC

## 2015-03-06 MED ORDER — PRAVASTATIN SODIUM 80 MG PO TABS: 80.0000 mg | ORAL_TABLET | Freq: Every day | ORAL | Status: DC

## 2015-03-06 NOTE — Addendum Note (Signed)
Addended by: Selmer Dominion on: 03/06/2015 11:59 AM   Modules accepted: Orders

## 2015-03-06 NOTE — Progress Notes (Signed)
Subjective:    Patient ID: David Garza, male    DOB: 05/11/1950, 64 y.o.   MRN: [id number]  Patient in today for follow up- no changes since last visit- NO complaints today  Hyperlipidemia This is a chronic problem. The current episode started more than 1 year ago. The problem is controlled. Recent lipid tests were reviewed and are variable. Pertinent negatives include no myalgias or shortness of breath. Current antihyperlipidemic treatment includes statins. The current treatment provides moderate improvement of lipids. Compliance problems include adherence to diet and adherence to exercise.   Hypertension This is a chronic problem. The current episode started more than 1 year ago. The problem is unchanged. The problem is controlled. Pertinent negatives include no headaches, palpitations or shortness of breath. Risk factors for coronary artery disease include dyslipidemia, obesity and male gender. Past treatments include ACE inhibitors and diuretics. The current treatment provides moderate improvement. Compliance problems include diet and exercise.   Gout Patient had a flare up several months ago. Took colchicine and cleared up. No flare-ups since then GERD Currently on omeprazole daily- works well to keep symptoms under control  Review of Systems  Constitutional: Negative.   HENT: Negative.   Respiratory: Negative for shortness of breath.   Cardiovascular: Negative for palpitations.  Genitourinary: Negative.   Musculoskeletal: Negative for myalgias.  Neurological: Negative for headaches.  Psychiatric/Behavioral: Negative.   All other systems reviewed and are negative.      Objective:   Physical Exam  Constitutional: He is oriented to person, place, and time. He appears well-developed and well-nourished.  HENT:  Head: Normocephalic.  Right Ear: External ear normal.  Left Ear: External ear normal.  Nose: Nose normal.  Mouth/Throat: Oropharynx is clear and moist.  Eyes: EOM  are normal. Pupils are equal, round, and reactive to light.  Neck: Normal range of motion. Neck supple. No thyromegaly present.  Cardiovascular: Normal rate, regular rhythm, normal heart sounds and intact distal pulses.   No murmur heard. Pulmonary/Chest: Effort normal and breath sounds normal. He has no wheezes. He has no rales.  Abdominal: Soft. Bowel sounds are normal.  Genitourinary:  Patient refuses DRE.  Musculoskeletal: Normal range of motion.  Neurological: He is alert and oriented to person, place, and time.  Skin: Skin is warm and dry.  Psychiatric: He has a normal mood and affect. His behavior is normal. Judgment and thought content normal.   BP 134/91 mmHg  Pulse 69  Temp(Src) 97 F (36.1 C) (Oral)  Ht 5\' 8"  (1.727 m)  Wt 216 lb (97.977 kg)  BMI 32.85 kg/m2       Assessment & Plan:  1. Essential hypertension Do not add slat to diet - Enalapril-Hydrochlorothiazide 5-12.5 MG tablet; Take 1 tablet by mouth daily.  Dispense: 90 tablet; Refill: 1  2. Hyperlipidemia Low fat diet - pravastatin (PRAVACHOL) 80 MG tablet; Take 1 tablet (80 mg total) by mouth daily.  Dispense: 90 tablet; Refill: 1  3. Acute idiopathic gout of left foot Low purine diet  4. Gastroesophageal reflux disease without esophagitis Avoid spicy foods Do not eat 2 hours prior to bedtime  - omeprazole (PRILOSEC) 40 MG capsule; Take 1 capsule (40 mg total) by mouth daily.  Dispense: 90 capsule; Refill: 1  5. BMI 32.0-32.9,adult Discussed diet and exercise for person with BMI >25 Will recheck weight in 3-6 months   6. Need for hepatitis C screening test - Hepatitis C antibody  7. Prostate cancer screening    Labs pending Health  maintenance reviewed Diet and exercise encouraged Continue all meds Follow up  In 3 month   [place], FNP

## 2015-03-06 NOTE — Patient Instructions (Signed)
Bone Health Bones protect organs, store calcium, and anchor muscles. Good health habits, such as eating nutritious foods and exercising regularly, are important for maintaining healthy bones. They can also help to prevent a condition that causes bones to lose density and become weak and brittle (osteoporosis). WHY IS BONE MASS IMPORTANT? Bone mass refers to the amount of bone tissue that you have. The higher your bone mass, the stronger your bones. An important step toward having healthy bones throughout life is to have strong and dense bones during childhood. A young adult who has a high bone mass is more likely to have a high bone mass later in life. Bone mass at its greatest it is called peak bone mass. A large decline in bone mass occurs in older adults. In women, it occurs about the time of menopause. During this time, it is important to practice good health habits, because if more bone is lost than what is replaced, the bones will become less healthy and more likely to break (fracture). If you find that you have a low bone mass, you may be able to prevent osteoporosis or further bone loss by changing your diet and lifestyle. HOW CAN I FIND OUT IF MY BONE MASS IS LOW? Bone mass can be measured with an X-ray test that is called a bone mineral density (BMD) test. This test is recommended for all women who are age 65 or older. It may also be recommended for men who are age 70 or older, or for people who are more likely to develop osteoporosis due to:  Having bones that break easily.  Having a long-term disease that weakens bones, such as kidney disease or rheumatoid arthritis.  Having menopause earlier than normal.  Taking medicine that weakens bones, such as steroids, thyroid hormones, or hormone treatment for breast cancer or prostate cancer.  Smoking.  Drinking three or more alcoholic drinks each day. WHAT ARE THE NUTRITIONAL RECOMMENDATIONS FOR HEALTHY BONES? To have healthy bones, you need  to get enough of the right minerals and vitamins. Most nutrition experts recommend getting these nutrients from the foods that you eat. Nutritional recommendations vary from person to person. Ask your health care provider what is healthy for you. Here are some general guidelines. Calcium Recommendations Calcium is the most important (essential) mineral for bone health. Most people can get enough calcium from their diet, but supplements may be recommended for people who are at risk for osteoporosis. Good sources of calcium include:  Dairy products, such as low-fat or nonfat milk, cheese, and yogurt.  Dark green leafy vegetables, such as bok choy and broccoli.  Calcium-fortified foods, such as orange juice, cereal, bread, soy beverages, and tofu products.  Nuts, such as almonds. Follow these recommended amounts for daily calcium intake:  Children, age 1-3: 700 mg.  Children, age 4-8: 1,000 mg.  Children, age 9-13: 1,300 mg.  Teens, age 14-18: 1,300 mg.  Adults, age 19-50: 1,000 mg.  Adults, age 51-70:  Men: 1,000 mg.  Women: 1,200 mg.  Adults, age 71 or older: 1,200 mg.  Pregnant and breastfeeding females:  Teens: 1,300 mg.  Adults: 1,000 mg. Vitamin D Recommendations Vitamin D is the most essential vitamin for bone health. It helps the body to absorb calcium. Sunlight stimulates the skin to make vitamin D, so be sure to get enough sunlight. If you live in a cold climate or you do not get outside often, your health care provider may recommend that you take vitamin D supplements. Good   sources of vitamin D in your diet include:  Egg yolks.  Saltwater fish.  Milk and cereal fortified with vitamin D. Follow these recommended amounts for daily vitamin D intake:  Children and teens, age 1-18: 600 international units.  Adults, age 50 or younger: 400-800 international units.  Adults, age 51 or older: 800-1,000 international units. Other Nutrients Other nutrients for bone  health include:  Phosphorus. This mineral is found in meat, poultry, dairy foods, nuts, and legumes. The recommended daily intake for adult men and adult women is 700 mg.  Magnesium. This mineral is found in seeds, nuts, dark green vegetables, and legumes. The recommended daily intake for adult men is 400-420 mg. For adult women, it is 310-320 mg.  Vitamin K. This vitamin is found in green leafy vegetables. The recommended daily intake is 120 mg for adult men and 90 mg for adult women. WHAT TYPE OF PHYSICAL ACTIVITY IS BEST FOR BUILDING AND MAINTAINING HEALTHY BONES? Weight-bearing and strength-building activities are important for building and maintaining peak bone mass. Weight-bearing activities cause muscles and bones to work against gravity. Strength-building activities increases muscle strength that supports bones. Weight-bearing and muscle-building activities include:  Walking and hiking.  Jogging and running.  Dancing.  Gym exercises.  Lifting weights.  Tennis and racquetball.  Climbing stairs.  Aerobics. Adults should get at least 30 minutes of moderate physical activity on most days. Children should get at least 60 minutes of moderate physical activity on most days. Ask your health care provide what type of exercise is best for you. WHERE CAN I FIND MORE INFORMATION? For more information, check out the following websites:  National Osteoporosis Foundation: http://nof.org/learn/basics  National Institutes of Health: http://www.niams.nih.gov/Health_Info/Bone/Bone_Health/bone_health_for_life.asp   This information is not intended to replace advice given to you by your health care provider. Make sure you discuss any questions you have with your health care provider.   Document Released: 07/09/2003 Document Revised: 09/02/2014 Document Reviewed: 04/23/2014 Elsevier Interactive Patient Education 2016 [employer] Inc.  

## 2015-03-09 LAB — PSA TOTAL+% FREE (SERIAL)
PROSTATE SPECIFIC AG, SERUM: 0.5 ng/mL (ref 0.0–4.0)
PSA FREE: 0.3 ng/mL
PSA, Free Pct: 60 %

## 2015-03-09 LAB — SPECIMEN STATUS REPORT

## 2015-03-09 LAB — HEPATITIS C ANTIBODY: Hep C Virus Ab: <0.1 s/co ratio (ref 0.0–0.9)

## 2015-06-04 ENCOUNTER — Other Ambulatory Visit: Payer: BLUE CROSS/BLUE SHIELD

## 2015-06-04 DIAGNOSIS — E785 Hyperlipidemia, unspecified: Secondary | ICD-10-CM

## 2015-06-04 DIAGNOSIS — I10 Essential (primary) hypertension: Secondary | ICD-10-CM

## 2015-06-04 NOTE — Progress Notes (Signed)
Lab only 

## 2015-06-05 LAB — CMP14+EGFR
ALK PHOS: 41 IU/L (ref 39–117)
ALT: 15 IU/L (ref 0–44)
AST: 16 IU/L (ref 0–40)
Albumin/Globulin Ratio: 2.1 (ref 1.1–2.5)
Albumin: 4.5 g/dL (ref 3.6–4.8)
BUN/Creatinine Ratio: 11 (ref 10–22)
BUN: 12 mg/dL (ref 8–27)
Bilirubin Total: 0.8 mg/dL (ref 0.0–1.2)
CALCIUM: 9.7 mg/dL (ref 8.6–10.2)
CO2: 25 mmol/L (ref 18–29)
CREATININE: 1.09 mg/dL (ref 0.76–1.27)
Chloride: 100 mmol/L (ref 96–106)
GFR calc Af Amer: 83 mL/min/{1.73_m2} (ref >59)
GFR, EST NON AFRICAN AMERICAN: 72 mL/min/{1.73_m2} (ref >59)
GLUCOSE: 98 mg/dL (ref 65–99)
Globulin, Total: 2.1 g/dL (ref 1.5–4.5)
Potassium: 4 mmol/L (ref 3.5–5.2)
Sodium: 142 mmol/L (ref 134–144)
Total Protein: 6.6 g/dL (ref 6.0–8.5)

## 2015-06-05 LAB — LIPID PANEL
CHOLESTEROL TOTAL: 140 mg/dL (ref 100–199)
Chol/HDL Ratio: 4.4 ratio units (ref 0.0–5.0)
HDL: 32 mg/dL — ABNORMAL LOW (ref >39)
LDL Calculated: 85 mg/dL (ref 0–99)
Triglycerides: 113 mg/dL (ref 0–149)
VLDL Cholesterol Cal: 23 mg/dL (ref 5–40)

## 2015-06-08 ENCOUNTER — Encounter: Payer: Self-pay | Admitting: Nurse Practitioner

## 2015-06-08 ENCOUNTER — Ambulatory Visit (INDEPENDENT_AMBULATORY_CARE_PROVIDER_SITE_OTHER): Payer: BLUE CROSS/BLUE SHIELD | Admitting: Nurse Practitioner

## 2015-06-08 VITALS — BP 142/92 | HR 70 | Temp 97.0°F | Ht 68.0 in | Wt 218.0 lb

## 2015-06-08 DIAGNOSIS — K219 Gastro-esophageal reflux disease without esophagitis: Secondary | ICD-10-CM | POA: Diagnosis not present

## 2015-06-08 DIAGNOSIS — I10 Essential (primary) hypertension: Secondary | ICD-10-CM

## 2015-06-08 DIAGNOSIS — M10072 Idiopathic gout, left ankle and foot: Secondary | ICD-10-CM

## 2015-06-08 DIAGNOSIS — Z6832 Body mass index (BMI) 32.0-32.9, adult: Secondary | ICD-10-CM

## 2015-06-08 DIAGNOSIS — E785 Hyperlipidemia, unspecified: Secondary | ICD-10-CM

## 2015-06-08 MED ORDER — PRAVASTATIN SODIUM 80 MG PO TABS: 80.0000 mg | ORAL_TABLET | Freq: Every day | ORAL | Status: DC

## 2015-06-08 MED ORDER — OMEPRAZOLE 40 MG PO CPDR: 40.0000 mg | DELAYED_RELEASE_CAPSULE | Freq: Every day | ORAL | Status: DC

## 2015-06-08 MED ORDER — ENALAPRIL-HYDROCHLOROTHIAZIDE 5-12.5 MG PO TABS
1.0000 | ORAL_TABLET | Freq: Every day | ORAL | Status: DC
Start: 2015-06-08 — End: 2015-10-21

## 2015-06-08 NOTE — Patient Instructions (Signed)
Exercising to Stay Healthy Exercising regularly is important. It has many health benefits, such as:  Improving your overall fitness, flexibility, and endurance.  Increasing your bone density.  Helping with weight control.  Decreasing your body fat.  Increasing your muscle strength.  Reducing stress and tension.  Improving your overall health. In order to become healthy and stay healthy, it is recommended that you do moderate-intensity and vigorous-intensity exercise. You can tell that you are exercising at a moderate intensity if you have a higher heart rate and faster breathing, but you are still able to hold a conversation. You can tell that you are exercising at a vigorous intensity if you are breathing much harder and faster and cannot hold a conversation while exercising. HOW OFTEN SHOULD I EXERCISE? Choose an activity that you enjoy and set realistic goals. Your health care provider can help you to make an activity plan that works for you. Exercise regularly as directed by your health care provider. This may include:   Doing resistance training twice each week, such as:  Push-ups.  Sit-ups.  Lifting weights.  Using resistance bands.  Doing a given intensity of exercise for a given amount of time. Choose from these options:  150 minutes of moderate-intensity exercise every week.  75 minutes of vigorous-intensity exercise every week.  A mix of moderate-intensity and vigorous-intensity exercise every week. Children, pregnant women, people who are out of shape, people who are overweight, and older adults may need to consult a health care provider for individual recommendations. If you have any sort of medical condition, be sure to consult your health care provider before starting a new exercise program.  WHAT ARE SOME EXERCISE IDEAS? Some moderate-intensity exercise ideas include:   Walking at a rate of 1 mile in 15  minutes.  Biking.  Hiking.  Golfing.  Dancing. Some vigorous-intensity exercise ideas include:   Walking at a rate of at least 4.5 miles per hour.  Jogging or running at a rate of 5 miles per hour.  Biking at a rate of at least 10 miles per hour.  Lap swimming.  Roller-skating or in-line skating.  Cross-country skiing.  Vigorous competitive sports, such as football, basketball, and soccer.  Jumping rope.  Aerobic dancing. WHAT ARE SOME EVERYDAY ACTIVITIES THAT CAN HELP ME TO GET EXERCISE?  Yard work, such as:  Pushing a lawn mower.  Raking and bagging leaves.  Washing and waxing your car.  Pushing a stroller.  Shoveling snow.  Gardening.  Washing windows or floors. HOW CAN I BE MORE ACTIVE IN MY DAY-TO-DAY ACTIVITIES?  Use the stairs instead of the elevator.  Take a walk during your lunch break.  If you drive, park your car farther away from work or school.  If you take public transportation, get off one stop early and walk the rest of the way.  Make all of your phone calls while standing up and walking around.  Get up, stretch, and walk around every 30 minutes throughout the day. WHAT GUIDELINES SHOULD I FOLLOW WHILE EXERCISING?  Do not exercise so much that you hurt yourself, feel dizzy, or get very short of breath.  Consult your health care provider before starting a new exercise program.  Wear comfortable clothes and shoes with good support.  Drink plenty of water while you exercise to prevent dehydration or heat stroke. Body water is lost during exercise and must be replaced.  Work out until you breathe faster and your heart beats faster.   This information   is not intended to replace advice given to you by your health care provider. Make sure you discuss any questions you have with your health care provider.   Document Released: 05/21/2010 Document Revised: 05/09/2014 Document Reviewed: 09/19/2013 Elsevier Interactive Patient Education  2016 Elsevier Inc.  

## 2015-06-08 NOTE — Progress Notes (Signed)
Subjective:    Patient ID: David Garza, male    DOB: January 07, 1951, 65 y.o.   MRN: [id number]  Patient here today for follow up of chronic medical problems.  Outpatient Encounter Prescriptions as of 06/08/2015  Medication Sig  . aspirin 81 MG tablet Take 81 mg by mouth daily.  . Enalapril-Hydrochlorothiazide 5-12.5 MG tablet Take 1 tablet by mouth daily.  . fish oil-omega-3 fatty acids 1000 MG capsule Take 2 g by mouth daily.  Marland Kitchen omeprazole (PRILOSEC) 40 MG capsule Take 1 capsule (40 mg total) by mouth daily.  . pravastatin (PRAVACHOL) 80 MG tablet Take 1 tablet (80 mg total) by mouth daily.   No facility-administered encounter medications on file as of 06/08/2015.     Hyperlipidemia This is a chronic problem. The current episode started more than 1 year ago. The problem is controlled. Recent lipid tests were reviewed and are variable. Pertinent negatives include no myalgias or shortness of breath. Current antihyperlipidemic treatment includes statins. The current treatment provides moderate improvement of lipids. Compliance problems include adherence to diet and adherence to exercise.   Hypertension This is a chronic problem. The current episode started more than 1 year ago. The problem is unchanged. The problem is controlled. Pertinent negatives include no headaches, palpitations or shortness of breath. Risk factors for coronary artery disease include dyslipidemia, obesity and male gender. Past treatments include ACE inhibitors and diuretics. The current treatment provides moderate improvement. Compliance problems include diet and exercise.   Gout Patient had a flare up several months ago. Took colchicine and cleared up. No flare-ups since then GERD Currently on omeprazole daily- works well to keep symptoms under control  Review of Systems  Constitutional: Negative.   HENT: Negative.   Respiratory: Negative for shortness of breath.   Cardiovascular: Negative for palpitations.   Genitourinary: Negative.   Musculoskeletal: Negative for myalgias.  Neurological: Negative for headaches.  Psychiatric/Behavioral: Negative.   All other systems reviewed and are negative.      Objective:   Physical Exam  Constitutional: He is oriented to person, place, and time. He appears well-developed and well-nourished.  HENT:  Head: Normocephalic.  Right Ear: External ear normal.  Left Ear: External ear normal.  Nose: Nose normal.  Mouth/Throat: Oropharynx is clear and moist.  Eyes: EOM are normal. Pupils are equal, round, and reactive to light.  Neck: Normal range of motion. Neck supple. No thyromegaly present.  Cardiovascular: Normal rate, regular rhythm, normal heart sounds and intact distal pulses.   No murmur heard. Pulmonary/Chest: Effort normal and breath sounds normal. He has no wheezes. He has no rales.  Abdominal: Soft. Bowel sounds are normal.  Genitourinary:  Patient refuses DRE.  Musculoskeletal: Normal range of motion.  Neurological: He is alert and oriented to person, place, and time.  Skin: Skin is warm and dry.  Psychiatric: He has a normal mood and affect. His behavior is normal. Judgment and thought content normal.   BP 142/92 mmHg  Pulse 70  Temp(Src) 97 F (36.1 C) (Oral)  Ht 5\' 8"  (1.727 m)  Wt 218 lb (98.884 kg)  BMI 33.15 kg/m2      Assessment & Plan:  1. Essential hypertension Do not add salt to diet  2. Gastroesophageal reflux disease without esophagitis Avoid spicy foods Do not eat 2 hours prior to bedtime  3. Hyperlipidemia Low fta diet  4. BMI 32.0-32.9,adult Discussed diet and exercise for person with BMI >25 Will recheck weight in 3-6 months  5. Acute idiopathic gout  of left foot Low purine diet  Meds ordered this encounter  Medications  . Enalapril-Hydrochlorothiazide 5-12.5 MG tablet    Sig: Take 1 tablet by mouth daily.    Dispense:  90 tablet    Refill:  1    Order Specific Question:  Supervising Provider     Answer:  Chipper Herb [[id number]]  . omeprazole (PRILOSEC) 40 MG capsule    Sig: Take 1 capsule (40 mg total) by mouth daily.    Dispense:  90 capsule    Refill:  1    Order Specific Question:  Supervising Provider    Answer:  Chipper Herb [[id number]]  . pravastatin (PRAVACHOL) 80 MG tablet    Sig: Take 1 tablet (80 mg total) by mouth daily.    Dispense:  90 tablet    Refill:  1    Order Specific Question:  Supervising Provider    Answer:  Chipper Herb [[id number]]     Labs reviewed at appointment Health maintenance reviewed Diet and exercise encouraged Continue all meds Follow up  In 6 months   [place], FNP

## 2015-06-23 ENCOUNTER — Ambulatory Visit (INDEPENDENT_AMBULATORY_CARE_PROVIDER_SITE_OTHER): Payer: BLUE CROSS/BLUE SHIELD

## 2015-06-23 ENCOUNTER — Encounter: Payer: Self-pay | Admitting: Family Medicine

## 2015-06-23 ENCOUNTER — Ambulatory Visit (INDEPENDENT_AMBULATORY_CARE_PROVIDER_SITE_OTHER): Payer: BLUE CROSS/BLUE SHIELD | Admitting: Family Medicine

## 2015-06-23 VITALS — BP 153/95 | HR 68 | Temp 96.7°F | Ht 68.0 in | Wt 221.0 lb

## 2015-06-23 DIAGNOSIS — M25562 Pain in left knee: Secondary | ICD-10-CM | POA: Diagnosis not present

## 2015-06-23 NOTE — Progress Notes (Signed)
   Subjective:    Patient ID: David Garza, male    DOB: June 03, 1950, 65 y.o.   MRN: [id number]  HPI Patient here today for left knee pain with no known injury. He denies swelling giving way or locking. He is able to navigate steps okay. The pain is more medial.      Patient Active Problem List   Diagnosis Date Noted  . Gastroesophageal reflux disease without esophagitis 03/06/2015  . BMI 32.0-32.9,adult 03/06/2015  . Hypertension 09/17/2012  . Hyperlipidemia 09/17/2012  . Gout 09/17/2012   Outpatient Encounter Prescriptions as of 06/23/2015  Medication Sig  . aspirin 81 MG tablet Take 81 mg by mouth daily.  . Enalapril-Hydrochlorothiazide 5-12.5 MG tablet Take 1 tablet by mouth daily.  . fish oil-omega-3 fatty acids 1000 MG capsule Take 2 g by mouth daily.  Marland Kitchen omeprazole (PRILOSEC) 40 MG capsule Take 1 capsule (40 mg total) by mouth daily.  . pravastatin (PRAVACHOL) 80 MG tablet Take 1 tablet (80 mg total) by mouth daily.   No facility-administered encounter medications on file as of 06/23/2015.      Review of Systems  Constitutional: Negative.   HENT: Negative.   Eyes: Negative.   Respiratory: Negative.   Cardiovascular: Negative.   Gastrointestinal: Negative.   Endocrine: Negative.   Genitourinary: Negative.   Musculoskeletal: Positive for arthralgias (left knee pain).  Skin: Negative.   Allergic/Immunologic: Negative.   Neurological: Negative.   Hematological: Negative.   Psychiatric/Behavioral: Negative.        Objective:   Physical Exam  Constitutional: He appears well-developed and well-nourished.  Musculoskeletal:  Left knee: There is no effusion. There is tenderness over the medial joint line. There is no instability to stress testing.  X-ray shows some narrowing of the joint space on the medial aspect probably consistent with some degeneration of the cartilage.  We discussed treatment options and decided to try injection. Using a lateral approach  knee was injected with Depo-Medrol and Marcaine without complications.   BP 153/95 mmHg  Pulse 68  Temp(Src) 96.7 F (35.9 C) (Oral)  Ht 5\' 8"  (1.727 m)  Wt 221 lb (100.245 kg)  BMI 33.61 kg/m2        Assessment & Plan:  1. Left knee pain Probable degenerative arthritis. Knee injected as described above - DG Knee 1-2 Views Left; Future

## 2015-07-07 ENCOUNTER — Encounter: Payer: Self-pay | Admitting: Family Medicine

## 2015-07-07 ENCOUNTER — Ambulatory Visit (INDEPENDENT_AMBULATORY_CARE_PROVIDER_SITE_OTHER): Payer: BLUE CROSS/BLUE SHIELD | Admitting: Family Medicine

## 2015-07-07 VITALS — BP 133/84 | HR 95 | Temp 100.4°F | Ht 68.0 in | Wt 214.4 lb

## 2015-07-07 DIAGNOSIS — J111 Influenza due to unidentified influenza virus with other respiratory manifestations: Secondary | ICD-10-CM | POA: Diagnosis not present

## 2015-07-07 DIAGNOSIS — R509 Fever, unspecified: Secondary | ICD-10-CM | POA: Diagnosis not present

## 2015-07-07 LAB — VERITOR FLU A/B WAIVED
INFLUENZA B: POSITIVE — AB
Influenza A: NEGATIVE

## 2015-07-07 MED ORDER — ALBUTEROL SULFATE HFA 108 (90 BASE) MCG/ACT IN AERS: 2.0000 | INHALATION_SPRAY | Freq: Four times a day (QID) | RESPIRATORY_TRACT | Status: DC | PRN

## 2015-07-07 MED ORDER — FLUTICASONE PROPIONATE 50 MCG/ACT NA SUSP: 1.0000 | Freq: Two times a day (BID) | NASAL | Status: DC | PRN

## 2015-07-07 MED ORDER — OSELTAMIVIR PHOSPHATE 75 MG PO CAPS: 75.0000 mg | ORAL_CAPSULE | Freq: Two times a day (BID) | ORAL | Status: DC

## 2015-07-07 NOTE — Progress Notes (Signed)
BP 133/84 mmHg  Pulse 95  Temp(Src) 100.4 F (38 C) (Oral)  Ht 5\' 8"  (1.727 m)  Wt 214 lb 6.4 oz (97.251 kg)  BMI 32.61 kg/m2   Subjective:    Patient ID: David Garza, male    DOB: [birth year], 65 y.o.   MRN: [id number]  HPI: David Garza is a 65 y.o. male presenting on 07/07/2015 for Fever; Cough; Generalized Body Aches; and Chills   HPI Fever chills and body aches Patient has been having fevers and chills and cough and nasal congestion and postnasal drainage. The fevers and chills just started last night. Before that he was having low-grade cough and sinus congestion for the past week. The real symptoms of aches and chills just started yesterday though. He has been using Tylenol Sinus and cold which has not been helping much. He denies any sick contacts that he knows of. He does say he is a little shortness of breath but denies any wheezing.  Relevant past medical, surgical, family and social history reviewed and updated as indicated. Interim medical history since our last visit reviewed. Allergies and medications reviewed and updated.  Review of Systems  Constitutional: Positive for fever and chills.  HENT: Positive for congestion, postnasal drip, rhinorrhea, sinus pressure, sneezing and sore throat. Negative for ear discharge, ear pain and voice change.   Eyes: Negative for pain, discharge, redness and visual disturbance.  Respiratory: Positive for cough and shortness of breath. Negative for chest tightness and wheezing.   Cardiovascular: Negative for chest pain and leg swelling.  Gastrointestinal: Negative for abdominal pain, diarrhea and constipation.  Genitourinary: Negative for difficulty urinating.  Musculoskeletal: Positive for myalgias. Negative for back pain and gait problem.  Skin: Negative for rash.  Neurological: Negative for syncope, light-headedness and headaches.  All other systems reviewed and are negative.   Per HPI unless specifically indicated  above    Medication List       This list is accurate as of: 07/07/15 11:26 AM.  Always use your most recent med list.               albuterol 108 (90 Base) MCG/ACT inhaler  Commonly known as:  PROVENTIL HFA;VENTOLIN HFA  Inhale 2 puffs into the lungs every 6 (six) hours as needed for wheezing or shortness of breath.     aspirin 81 MG tablet  Take 81 mg by mouth daily.     Enalapril-Hydrochlorothiazide 5-12.5 MG tablet  Take 1 tablet by mouth daily.     fish oil-omega-3 fatty acids 1000 MG capsule  Take 2 g by mouth daily.     fluticasone 50 MCG/ACT nasal spray  Commonly known as:  FLONASE  Place 1 spray into both nostrils 2 (two) times daily as needed for allergies or rhinitis.     omeprazole 40 MG capsule  Commonly known as:  PRILOSEC  Take 1 capsule (40 mg total) by mouth daily.     oseltamivir 75 MG capsule  Commonly known as:  TAMIFLU  Take 1 capsule (75 mg total) by mouth 2 (two) times daily.     pravastatin 80 MG tablet  Commonly known as:  PRAVACHOL  Take 1 tablet (80 mg total) by mouth daily.          Objective:    BP 133/84 mmHg  Pulse 95  Temp(Src) 100.4 F (38 C) (Oral)  Ht 5\' 8"  (1.727 m)  Wt 214 lb 6.4 oz (97.251 kg)  BMI 32.61 kg/m2  Wt  Readings from Last 3 Encounters:  07/07/15 214 lb 6.4 oz (97.251 kg)  06/23/15 221 lb (100.245 kg)  06/08/15 218 lb (98.884 kg)    Physical Exam  Constitutional: He is oriented to person, place, and time. He appears well-developed and well-nourished. No distress.  HENT:  Right Ear: Tympanic membrane, external ear and ear canal normal.  Left Ear: Tympanic membrane, external ear and ear canal normal.  Nose: Mucosal edema and rhinorrhea present. No sinus tenderness. No epistaxis. Right sinus exhibits maxillary sinus tenderness. Right sinus exhibits no frontal sinus tenderness. Left sinus exhibits maxillary sinus tenderness. Left sinus exhibits no frontal sinus tenderness.  Mouth/Throat: Uvula is midline and  mucous membranes are normal. Posterior oropharyngeal edema and posterior oropharyngeal erythema present. No oropharyngeal exudate or tonsillar abscesses.  Eyes: Conjunctivae and EOM are normal. Pupils are equal, round, and reactive to light. Right eye exhibits no discharge. No scleral icterus.  Neck: Neck supple. No thyromegaly present.  Cardiovascular: Normal rate, regular rhythm, normal heart sounds and intact distal pulses.   No murmur heard. Pulmonary/Chest: Effort normal and breath sounds normal. No respiratory distress. He has no wheezes. He has no rales.  Musculoskeletal: Normal range of motion. He exhibits no edema.  Lymphadenopathy:    He has no cervical adenopathy.  Neurological: He is alert and oriented to person, place, and time. Coordination normal.  Skin: Skin is warm and dry. No rash noted. He is not diaphoretic.  Psychiatric: He has a normal mood and affect. His behavior is normal.  Nursing note and vitals reviewed.     Assessment & Plan:   Problem List Items Addressed This Visit    None    Visit Diagnoses    Fever, unspecified    -  Primary    Relevant Medications    fluticasone (FLONASE) 50 MCG/ACT nasal spray    oseltamivir (TAMIFLU) 75 MG capsule    albuterol (PROVENTIL HFA;VENTOLIN HFA) 108 (90 Base) MCG/ACT inhaler    Other Relevant Orders    Veritor Flu A/B Waived    Influenza with respiratory manifestation        Relevant Medications    fluticasone (FLONASE) 50 MCG/ACT nasal spray    oseltamivir (TAMIFLU) 75 MG capsule    albuterol (PROVENTIL HFA;VENTOLIN HFA) 108 (90 Base) MCG/ACT inhaler    Other Relevant Orders    Veritor Flu A/B Waived       Follow up plan: Return if symptoms worsen or fail to improve.  Counseling provided for all of the vaccine components Orders Placed This Encounter  Procedures  . Veritor Flu A/B [place], MD [facility] 07/07/2015, 11:26 AM

## 2015-08-26 ENCOUNTER — Other Ambulatory Visit: Payer: Self-pay | Admitting: Nurse Practitioner

## 2015-10-21 ENCOUNTER — Other Ambulatory Visit: Payer: Self-pay | Admitting: Nurse Practitioner

## 2015-12-03 ENCOUNTER — Other Ambulatory Visit: Payer: BLUE CROSS/BLUE SHIELD

## 2015-12-03 DIAGNOSIS — E785 Hyperlipidemia, unspecified: Secondary | ICD-10-CM

## 2015-12-03 DIAGNOSIS — I10 Essential (primary) hypertension: Secondary | ICD-10-CM

## 2015-12-04 LAB — CMP14+EGFR
ALT: 22 IU/L (ref 0–44)
AST: 21 IU/L (ref 0–40)
Albumin/Globulin Ratio: 2 (ref 1.2–2.2)
Albumin: 4.5 g/dL (ref 3.6–4.8)
Alkaline Phosphatase: 45 IU/L (ref 39–117)
BUN/Creatinine Ratio: 12 (ref 10–24)
BUN: 12 mg/dL (ref 8–27)
Bilirubin Total: 1.2 mg/dL (ref 0.0–1.2)
CALCIUM: 9.7 mg/dL (ref 8.6–10.2)
CO2: 26 mmol/L (ref 18–29)
CREATININE: 1.04 mg/dL (ref 0.76–1.27)
Chloride: 100 mmol/L (ref 96–106)
GFR, EST AFRICAN AMERICAN: 88 mL/min/{1.73_m2} (ref >59)
GFR, EST NON AFRICAN AMERICAN: 76 mL/min/{1.73_m2} (ref >59)
Globulin, Total: 2.2 g/dL (ref 1.5–4.5)
Glucose: 94 mg/dL (ref 65–99)
Potassium: 4.1 mmol/L (ref 3.5–5.2)
Sodium: 142 mmol/L (ref 134–144)
TOTAL PROTEIN: 6.7 g/dL (ref 6.0–8.5)

## 2015-12-04 LAB — LIPID PANEL
CHOLESTEROL TOTAL: 144 mg/dL (ref 100–199)
Chol/HDL Ratio: 4.6 ratio units (ref 0.0–5.0)
HDL: 31 mg/dL — AB (ref >39)
LDL CALC: 83 mg/dL (ref 0–99)
TRIGLYCERIDES: 151 mg/dL — AB (ref 0–149)
VLDL Cholesterol Cal: 30 mg/dL (ref 5–40)

## 2015-12-07 ENCOUNTER — Ambulatory Visit (INDEPENDENT_AMBULATORY_CARE_PROVIDER_SITE_OTHER): Payer: BLUE CROSS/BLUE SHIELD | Admitting: Nurse Practitioner

## 2015-12-07 ENCOUNTER — Encounter: Payer: Self-pay | Admitting: Nurse Practitioner

## 2015-12-07 VITALS — BP 129/89 | Temp 97.5°F | Ht 68.0 in | Wt 213.0 lb

## 2015-12-07 DIAGNOSIS — Z6832 Body mass index (BMI) 32.0-32.9, adult: Secondary | ICD-10-CM | POA: Diagnosis not present

## 2015-12-07 DIAGNOSIS — M10072 Idiopathic gout, left ankle and foot: Secondary | ICD-10-CM

## 2015-12-07 DIAGNOSIS — E785 Hyperlipidemia, unspecified: Secondary | ICD-10-CM

## 2015-12-07 DIAGNOSIS — K219 Gastro-esophageal reflux disease without esophagitis: Secondary | ICD-10-CM | POA: Diagnosis not present

## 2015-12-07 DIAGNOSIS — I10 Essential (primary) hypertension: Secondary | ICD-10-CM | POA: Diagnosis not present

## 2015-12-07 MED ORDER — OMEPRAZOLE 40 MG PO CPDR: 40.0000 mg | DELAYED_RELEASE_CAPSULE | Freq: Every day | ORAL | 1 refills | Status: DC

## 2015-12-07 MED ORDER — PRAVASTATIN SODIUM 80 MG PO TABS: 80.0000 mg | ORAL_TABLET | Freq: Every day | ORAL | 1 refills | Status: DC

## 2015-12-07 MED ORDER — ENALAPRIL-HYDROCHLOROTHIAZIDE 5-12.5 MG PO TABS
1.0000 | ORAL_TABLET | Freq: Every day | ORAL | 1 refills | Status: DC
Start: 2015-12-07 — End: 2016-04-13

## 2015-12-07 MED ORDER — ENALAPRIL-HYDROCHLOROTHIAZIDE 5-12.5 MG PO TABS: 1.5000 | ORAL_TABLET | Freq: Every day | ORAL | 1 refills | Status: DC

## 2015-12-07 NOTE — Addendum Note (Signed)
Addended by: [name] on: 12/07/2015 08:43 AM   Modules accepted: Orders

## 2015-12-07 NOTE — Progress Notes (Addendum)
Subjective:    Patient ID: David Garza, male    DOB: Sep 29, 1950, 65 y.o.   MRN: [id number]  Patient here today for follow up of chronic medical problems.  Outpatient Encounter Prescriptions as of 12/07/2015  Medication Sig  . aspirin 81 MG tablet Take 81 mg by mouth daily.  . Enalapril-Hydrochlorothiazide 5-12.5 MG tablet Take 1 tablet by mouth daily.  . fish oil-omega-3 fatty acids 1000 MG capsule Take 2 g by mouth daily.  Marland Kitchen omeprazole (PRILOSEC) 40 MG capsule Take 1 capsule (40 mg total) by mouth daily.  . pravastatin (PRAVACHOL) 80 MG tablet Take 1 tablet (80 mg total) by mouth daily.     Hypertension  This is a chronic problem. The current episode started more than 1 year ago. The problem is unchanged. The problem is controlled. Pertinent negatives include no headaches, palpitations or shortness of breath. Risk factors for coronary artery disease include dyslipidemia, obesity and male gender. Past treatments include ACE inhibitors and diuretics. The current treatment provides moderate improvement. Compliance problems include diet and exercise.   Hyperlipidemia  This is a chronic problem. The current episode started more than 1 year ago. The problem is controlled. Recent lipid tests were reviewed and are variable. Pertinent negatives include no myalgias or shortness of breath. Current antihyperlipidemic treatment includes statins. The current treatment provides moderate improvement of lipids. Compliance problems include adherence to diet and adherence to exercise.   Gout Patient had a flare up several months ago. Took colchicine and cleared up. No flare-ups since then GERD Currently on omeprazole daily- works well to keep symptoms under control  Review of Systems  Constitutional: Negative.   HENT: Negative.   Respiratory: Negative for shortness of breath.   Cardiovascular: Negative for palpitations.  Genitourinary: Negative.   Musculoskeletal: Negative for myalgias.   Neurological: Negative for headaches.  Psychiatric/Behavioral: Negative.   All other systems reviewed and are negative.      Objective:   Physical Exam  Constitutional: He is oriented to person, place, and time. He appears well-developed and well-nourished.  HENT:  Head: Normocephalic.  Right Ear: External ear normal.  Left Ear: External ear normal.  Nose: Nose normal.  Mouth/Throat: Oropharynx is clear and moist.  Eyes: EOM are normal. Pupils are equal, round, and reactive to light.  Neck: Normal range of motion. Neck supple. No thyromegaly present.  Cardiovascular: Normal rate, regular rhythm, normal heart sounds and intact distal pulses.   No murmur heard. Pulmonary/Chest: Effort normal and breath sounds normal. He has no wheezes. He has no rales.  Abdominal: Soft. Bowel sounds are normal.  Genitourinary:  Genitourinary Comments: Patient refuses DRE.  Musculoskeletal: Normal range of motion.  Neurological: He is alert and oriented to person, place, and time.  Skin: Skin is warm and dry.  Psychiatric: He has a normal mood and affect. His behavior is normal. Judgment and thought content normal.   BP 129/89 (BP Location: Right Arm, Patient Position: Sitting, Cuff Size: Large)   Temp 97.5 F (36.4 C) (Oral)   Ht '5\' 8"'$  (1.727 m)   Wt 213 lb (96.6 kg)   BMI 32.39 kg/m       Assessment & Plan:  1. Essential hypertension do not add salt to diet - Enalapril-Hydrochlorothiazide 5-12.5 MG tablet; Take 1.5  tablet by mouth daily.  Dispense: 90 tablet; Refill: 1 - CMP14+EGFR  2. Gastroesophageal reflux disease without esophagitis Avoid spicy foods Do not eat 2 hours prior to bedtime - omeprazole (PRILOSEC) 40 MG capsule;  Take 1 capsule (40 mg total) by mouth daily.  Dispense: 90 capsule; Refill: 1  3. Hyperlipidemia Low fat diet - pravastatin (PRAVACHOL) 80 MG tablet; Take 1 tablet (80 mg total) by mouth daily.  Dispense: 90 tablet; Refill: 1 - Lipid panel  4. Acute  idiopathic gout of left foot  5. BMI 32.0-32.9,adult Discussed diet and exercise for person with BMI >25 Will recheck weight in 3-6 months     Labs pending Health maintenance reviewed Diet and exercise encouraged Continue all meds Follow up  In 3 month   [place], FNP

## 2015-12-07 NOTE — Patient Instructions (Signed)

## 2015-12-09 ENCOUNTER — Other Ambulatory Visit: Payer: Self-pay | Admitting: Nurse Practitioner

## 2015-12-10 MED ORDER — COLCHICINE 0.6 MG PO TABS: ORAL_TABLET | ORAL | 1 refills | Status: DC

## 2015-12-10 NOTE — Telephone Encounter (Signed)
Prescription sent to pharmacy.

## 2015-12-10 NOTE — Telephone Encounter (Signed)
Left message regarding RX 

## 2015-12-10 NOTE — Telephone Encounter (Signed)
What gout medication is patient taking? I do not see any on pt's list.

## 2015-12-10 NOTE — Telephone Encounter (Signed)
Pt is requesting refill on Colchicine Please review and advise

## 2015-12-11 ENCOUNTER — Other Ambulatory Visit: Payer: Self-pay

## 2015-12-11 MED ORDER — COLCHICINE 0.6 MG PO TABS: ORAL_TABLET | ORAL | 1 refills | Status: DC

## 2015-12-22 ENCOUNTER — Encounter: Payer: Self-pay | Admitting: Family

## 2015-12-22 ENCOUNTER — Ambulatory Visit (INDEPENDENT_AMBULATORY_CARE_PROVIDER_SITE_OTHER): Payer: BLUE CROSS/BLUE SHIELD | Admitting: Family

## 2015-12-22 VITALS — BP 147/90 | HR 72 | Temp 96.8°F | Ht 68.0 in | Wt 212.0 lb

## 2015-12-22 DIAGNOSIS — S76912A Strain of unspecified muscles, fascia and tendons at thigh level, left thigh, initial encounter: Secondary | ICD-10-CM

## 2015-12-22 MED ORDER — NAPROXEN 500 MG PO TABS: 500.0000 mg | ORAL_TABLET | Freq: Two times a day (BID) | ORAL | 1 refills | Status: DC

## 2015-12-22 MED ORDER — CYCLOBENZAPRINE HCL 10 MG PO TABS: 10.0000 mg | ORAL_TABLET | Freq: Three times a day (TID) | ORAL | 0 refills | Status: DC | PRN

## 2015-12-22 NOTE — Patient Instructions (Signed)

## 2015-12-22 NOTE — Progress Notes (Signed)
   Subjective:    Patient ID: David Garza, male    DOB: 1950/06/04, 65 y.o.   MRN: [id number]  Leg Pain   The incident occurred 3 to 5 days ago. Injury mechanism: Working outside and overworked trying to move big wrenchs. The pain is present in the left thigh and left hip. The quality of the pain is described as aching. The pain is at a severity of 10/10. The pain is moderate. The pain has been constant since onset. Associated symptoms include an inability to bear weight. Pertinent negatives include no numbness or tingling. He reports no foreign bodies present. The symptoms are aggravated by movement and weight bearing. He has tried rest, acetaminophen, non-weight bearing and NSAIDs for the symptoms. The treatment provided mild relief.      Review of Systems  Musculoskeletal: Positive for arthralgias, gait problem and myalgias.  Neurological: Negative for tingling and numbness.  All other systems reviewed and are negative.      Objective:   Physical Exam  Constitutional: He is oriented to person, place, and time. He appears well-developed and well-nourished. No distress.  HENT:  Head: Normocephalic.  Eyes: Pupils are equal, round, and reactive to light. Right eye exhibits no discharge. Left eye exhibits no discharge.  Neck: Normal range of motion. Neck supple. No thyromegaly present.  Cardiovascular: Normal rate, regular rhythm, normal heart sounds and intact distal pulses.   No murmur heard. Pulmonary/Chest: Effort normal and breath sounds normal. No respiratory distress. He has no wheezes.  Abdominal: Soft. Bowel sounds are normal. He exhibits no distension. There is no tenderness.  Musculoskeletal: Normal range of motion. He exhibits edema (tenderness in left thigh with movement of flexion and extension). He exhibits no tenderness.  Neurological: He is alert and oriented to person, place, and time. He has normal reflexes. No cranial nerve deficit.  Skin: Skin is warm and dry. No  rash noted. No erythema.  Psychiatric: He has a normal mood and affect. His behavior is normal. Judgment and thought content normal.  Vitals reviewed.   BP (!) 147/90   Pulse 72   Temp (!) 96.8 F (36 C) (Oral)   Ht 5\' 8"  (1.727 m)   Wt 212 lb (96.2 kg)   BMI 32.23 kg/m        Assessment & Plan:  1. Muscle strain of left thigh, initial encounter -Rest -Ice and heat as needed -ROM exercises discussed -RTO prn  - cyclobenzaprine (FLEXERIL) 10 MG tablet; Take 1 tablet (10 mg total) by mouth 3 (three) times daily as needed for muscle spasms.  Dispense: 30 tablet; Refill: 0 - naproxen (NAPROSYN) 500 MG tablet; Take 1 tablet (500 mg total) by mouth 2 (two) times daily with a meal.  Dispense: 60 tablet; Refill: [place], FNP

## 2015-12-25 ENCOUNTER — Encounter: Payer: Self-pay | Admitting: Family Medicine

## 2015-12-25 ENCOUNTER — Ambulatory Visit (INDEPENDENT_AMBULATORY_CARE_PROVIDER_SITE_OTHER): Payer: BLUE CROSS/BLUE SHIELD | Admitting: Family Medicine

## 2015-12-25 VITALS — BP 139/84 | HR 87 | Temp 97.3°F | Ht 68.0 in | Wt 210.6 lb

## 2015-12-25 DIAGNOSIS — S73192A Other sprain of left hip, initial encounter: Secondary | ICD-10-CM | POA: Diagnosis not present

## 2015-12-25 DIAGNOSIS — S76392A Other specified injury of muscle, fascia and tendon of the posterior muscle group at thigh level, left thigh, initial encounter: Principal | ICD-10-CM

## 2015-12-25 DIAGNOSIS — IMO0001 Reserved for inherently not codable concepts without codable children: Secondary | ICD-10-CM

## 2015-12-25 MED ORDER — PREDNISONE 20 MG PO TABS: ORAL_TABLET | ORAL | 0 refills | Status: DC

## 2015-12-25 NOTE — Progress Notes (Signed)
BP 139/84   Pulse 87   Temp 97.3 F (36.3 C) (Oral)   Ht 5\' 8"  (1.727 m)   Wt 210 lb 9.6 oz (95.5 kg)   BMI 32.02 kg/m    Subjective:    Patient ID: David Garza, male    DOB: 21-Jul-1950, 65 y.o.   MRN: [id number]  HPI: David Garza is a 65 y.o. male presenting on 12/25/2015 for Leg and hip pain (Pain in upper left leg and hip, worse when walking; saw Christy this week and was given Flexeril & Naproxen which do not help)   HPI Left leg pain Patient comes in today because about 3 or 4 days ago he developed left hamstring and lateral thigh pain. He thinks he was working on a machine where he was doing a lot of pulling and twisting and jerking at home and he thinks he may have pulled a muscle in his hamstring or his leg. He saw Sherre Scarlet FNP and was given a muscle relaxer and naproxen and feels like they are not helping very much yet. He still having a lot of trouble with ambulation because of the pain which is mostly in the back of his hamstring. He denies any fevers or chills or overlying skin changes.  Relevant past medical, surgical, family and social history reviewed and updated as indicated. Interim medical history since our last visit reviewed. Allergies and medications reviewed and updated.  Review of Systems  Constitutional: Negative for chills and fever.  Eyes: Negative for discharge.  Respiratory: Negative for shortness of breath and wheezing.   Cardiovascular: Negative for chest pain and leg swelling.  Musculoskeletal: Positive for gait problem and myalgias. Negative for back pain.  Skin: Negative for rash.  Neurological: Negative for weakness and numbness.  All other systems reviewed and are negative.   Per HPI unless specifically indicated above     Medication List       Accurate as of 12/25/15  3:29 PM. Always use your most recent med list.          aspirin 81 MG tablet Take 81 mg by mouth daily.   colchicine 0.6 MG tablet Take 1.2 mg then  can repeat 0.6 mg for a total 1.8 mg in 24 hours   cyclobenzaprine 10 MG tablet Commonly known as:  FLEXERIL Take 1 tablet (10 mg total) by mouth 3 (three) times daily as needed for muscle spasms.   Enalapril-Hydrochlorothiazide 5-12.5 MG tablet Take 1 tablet by mouth daily.   fish oil-omega-3 fatty acids 1000 MG capsule Take 2 g by mouth daily.   naproxen 500 MG tablet Commonly known as:  NAPROSYN Take 1 tablet (500 mg total) by mouth 2 (two) times daily with a meal.   omeprazole 40 MG capsule Commonly known as:  PRILOSEC Take 1 capsule (40 mg total) by mouth daily.   pravastatin 80 MG tablet Commonly known as:  PRAVACHOL Take 1 tablet (80 mg total) by mouth daily.   predniSONE 20 MG tablet Commonly known as:  DELTASONE 2 po at same time daily for 5 days          Objective:    BP 139/84   Pulse 87   Temp 97.3 F (36.3 C) (Oral)   Ht 5\' 8"  (1.727 m)   Wt 210 lb 9.6 oz (95.5 kg)   BMI 32.02 kg/m   Wt Readings from Last 3 Encounters:  12/25/15 210 lb 9.6 oz (95.5 kg)  12/22/15 212 lb (96.2  kg)  12/07/15 213 lb (96.6 kg)    Physical Exam  Constitutional: He is oriented to person, place, and time. He appears well-developed and well-nourished. No distress.  Eyes: Conjunctivae are normal. Right eye exhibits no discharge. No scleral icterus.  Cardiovascular: Normal rate, regular rhythm, normal heart sounds and intact distal pulses.   No murmur heard. Pulmonary/Chest: Effort normal and breath sounds normal. No respiratory distress. He has no wheezes.  Musculoskeletal: Normal range of motion. He exhibits no edema.       Left upper leg: He exhibits tenderness (Tenderness over hamstring and IT band. Patient has difficulty ambulating on leg because of pain in the back of hamstring). He exhibits no bony tenderness, no swelling, no deformity and no laceration.  Neurological: He is alert and oriented to person, place, and time. Coordination normal.  Skin: Skin is warm and  dry. No rash noted. He is not diaphoretic.  Psychiatric: He has a normal mood and affect. His behavior is normal.  Nursing note and vitals reviewed.     Assessment & Plan:   Problem List Items Addressed This Visit    None    Visit Diagnoses    Hamstring sprain, left, initial encounter    -  Primary   Relevant Medications   predniSONE (DELTASONE) 20 MG tablet       Follow up plan: Return if symptoms worsen or fail to improve.  Counseling provided for all of the vaccine components No orders of the defined types were placed in this encounter.   Caryl Pina, MD [facility] 12/25/2015, 3:29 PM

## 2016-04-13 ENCOUNTER — Other Ambulatory Visit: Payer: Self-pay | Admitting: Nurse Practitioner

## 2016-04-13 DIAGNOSIS — I10 Essential (primary) hypertension: Secondary | ICD-10-CM

## 2016-04-13 DIAGNOSIS — E785 Hyperlipidemia, unspecified: Secondary | ICD-10-CM

## 2016-06-06 ENCOUNTER — Other Ambulatory Visit: Payer: BLUE CROSS/BLUE SHIELD

## 2016-06-06 DIAGNOSIS — Z125 Encounter for screening for malignant neoplasm of prostate: Secondary | ICD-10-CM

## 2016-06-06 DIAGNOSIS — E785 Hyperlipidemia, unspecified: Secondary | ICD-10-CM

## 2016-06-06 DIAGNOSIS — I10 Essential (primary) hypertension: Secondary | ICD-10-CM

## 2016-06-07 LAB — CMP14+EGFR
ALT: 16 IU/L (ref 0–44)
AST: 14 IU/L (ref 0–40)
Albumin/Globulin Ratio: 1.9 (ref 1.2–2.2)
Albumin: 4.4 g/dL (ref 3.6–4.8)
Alkaline Phosphatase: 44 IU/L (ref 39–117)
BUN/Creatinine Ratio: 10 (ref 10–24)
BUN: 9 mg/dL (ref 8–27)
Bilirubin Total: 0.6 mg/dL (ref 0.0–1.2)
CALCIUM: 9.4 mg/dL (ref 8.6–10.2)
CO2: 24 mmol/L (ref 18–29)
CREATININE: 0.94 mg/dL (ref 0.76–1.27)
Chloride: 101 mmol/L (ref 96–106)
GFR, EST AFRICAN AMERICAN: 99 mL/min/{1.73_m2} (ref >59)
GFR, EST NON AFRICAN AMERICAN: 85 mL/min/{1.73_m2} (ref >59)
GLUCOSE: 101 mg/dL — AB (ref 65–99)
Globulin, Total: 2.3 g/dL (ref 1.5–4.5)
Potassium: 4.3 mmol/L (ref 3.5–5.2)
Sodium: 142 mmol/L (ref 134–144)
TOTAL PROTEIN: 6.7 g/dL (ref 6.0–8.5)

## 2016-06-07 LAB — LIPID PANEL
CHOL/HDL RATIO: 4.6 ratio (ref 0.0–5.0)
Cholesterol, Total: 142 mg/dL (ref 100–199)
HDL: 31 mg/dL — AB (ref >39)
LDL Calculated: 80 mg/dL (ref 0–99)
Triglycerides: 154 mg/dL — ABNORMAL HIGH (ref 0–149)
VLDL CHOLESTEROL CAL: 31 mg/dL (ref 5–40)

## 2016-06-07 LAB — PSA, TOTAL AND FREE
PROSTATE SPECIFIC AG, SERUM: 0.7 ng/mL (ref 0.0–4.0)
PSA, Free Pct: 58.6 %
PSA, Free: 0.41 ng/mL

## 2016-06-09 ENCOUNTER — Ambulatory Visit (INDEPENDENT_AMBULATORY_CARE_PROVIDER_SITE_OTHER): Payer: BLUE CROSS/BLUE SHIELD | Admitting: Nurse Practitioner

## 2016-06-09 ENCOUNTER — Encounter: Payer: Self-pay | Admitting: Nurse Practitioner

## 2016-06-09 VITALS — BP 146/88 | HR 78 | Temp 97.0°F | Ht 68.0 in | Wt 219.0 lb

## 2016-06-09 DIAGNOSIS — K219 Gastro-esophageal reflux disease without esophagitis: Secondary | ICD-10-CM

## 2016-06-09 DIAGNOSIS — I10 Essential (primary) hypertension: Secondary | ICD-10-CM | POA: Diagnosis not present

## 2016-06-09 DIAGNOSIS — Z6833 Body mass index (BMI) 33.0-33.9, adult: Secondary | ICD-10-CM | POA: Diagnosis not present

## 2016-06-09 DIAGNOSIS — M10072 Idiopathic gout, left ankle and foot: Secondary | ICD-10-CM

## 2016-06-09 DIAGNOSIS — E782 Mixed hyperlipidemia: Secondary | ICD-10-CM

## 2016-06-09 DIAGNOSIS — E785 Hyperlipidemia, unspecified: Secondary | ICD-10-CM

## 2016-06-09 MED ORDER — PRAVASTATIN SODIUM 80 MG PO TABS: 80.0000 mg | ORAL_TABLET | Freq: Every day | ORAL | 1 refills | Status: DC

## 2016-06-09 MED ORDER — ENALAPRIL-HYDROCHLOROTHIAZIDE 5-12.5 MG PO TABS: 1.5000 | ORAL_TABLET | Freq: Every day | ORAL | 1 refills | Status: DC

## 2016-06-09 MED ORDER — OMEPRAZOLE 40 MG PO CPDR: 40.0000 mg | DELAYED_RELEASE_CAPSULE | Freq: Every day | ORAL | 1 refills | Status: DC

## 2016-06-09 NOTE — Patient Instructions (Signed)
Cholesterol Cholesterol is a fat. Your body needs a small amount of cholesterol. Cholesterol (plaque) may build up in your blood vessels (arteries). That makes you more likely to have a heart attack or stroke. You cannot feel your cholesterol level. Having a blood test is the only way to find out if your level is high. Keep your test results. Work with your doctor to keep your cholesterol at a good level. What do the results mean?  Total cholesterol is how much cholesterol is in your blood.  LDL is bad cholesterol. This is the type that can build up. Try to have low LDL.  HDL is good cholesterol. It cleans your blood vessels and carries LDL away. Try to have high HDL.  Triglycerides are fat that the body can store or burn for energy. What are good levels of cholesterol?  Total cholesterol below 200.  LDL below 100 is good for people who have health risks. LDL below 70 is good for people who have very high risks.  HDL above 40 is good. It is best to have HDL of 60 or higher.  Triglycerides below 150. How can I lower my cholesterol? Diet  Follow your diet program as told by your doctor.  Choose fish, white meat chicken, or turkey that is roasted or baked. Try not to eat red meat, fried foods, sausage, or lunch meats.  Eat lots of fresh fruits and vegetables.  Choose whole grains, beans, pasta, potatoes, and cereals.  Choose olive oil, corn oil, or canola oil. Only use small amounts.  Try not to eat butter, mayonnaise, shortening, or palm kernel oils.  Try not to eat foods with trans fats.  Choose low-fat or nonfat dairy foods.  Drink skim or nonfat milk.  Eat low-fat or nonfat yogurt and cheeses.  Try not to drink whole milk or cream.  Try not to eat ice cream, egg yolks, or full-fat cheeses.  Healthy desserts include angel food cake, ginger snaps, animal crackers, hard candy, popsicles, and low-fat or nonfat frozen yogurt. Try not to eat pastries, cakes, pies, and  cookies. Exercise  Follow your exercise program as told by your doctor.  Be more active. Try gardening, walking, and taking the stairs.  Ask your doctor about ways that you can be more active. Medicine  Take over-the-counter and prescription medicines only as told by your doctor. This information is not intended to replace advice given to you by your health care provider. Make sure you discuss any questions you have with your health care provider. Document Released: 07/15/2008 Document Revised: 11/18/2015 Document Reviewed: 10/29/2015 Elsevier Interactive Patient Education  2017 [employer]  

## 2016-06-09 NOTE — Progress Notes (Signed)
Subjective:    Patient ID: David Garza, male    DOB: [birth year], 66 y.o.   MRN: [id number]  Patient here today for follow up of chronic medical problems. No changes since last visit. No complaints today.  Outpatient Encounter Prescriptions as of 12/07/2015  Medication Sig  . aspirin 81 MG tablet Take 81 mg by mouth daily.  . Enalapril-Hydrochlorothiazide 5-12.5 MG tablet Take 1 tablet by mouth daily.  . fish oil-omega-3 fatty acids 1000 MG capsule Take 2 g by mouth daily.  Marland Kitchen omeprazole (PRILOSEC) 40 MG capsule Take 1 capsule (40 mg total) by mouth daily.  . pravastatin (PRAVACHOL) 80 MG tablet Take 1 tablet (80 mg total) by mouth daily.     Hypertension  This is a chronic problem. The current episode started more than 1 year ago. The problem is unchanged. The problem is controlled. Pertinent negatives include no headaches, palpitations or shortness of breath. Risk factors for coronary artery disease include dyslipidemia, obesity and male gender. Past treatments include ACE inhibitors and diuretics. The current treatment provides moderate improvement. Compliance problems include diet and exercise.   Hyperlipidemia  This is a chronic problem. The current episode started more than 1 year ago. The problem is controlled. Recent lipid tests were reviewed and are variable. Pertinent negatives include no myalgias or shortness of breath. Current antihyperlipidemic treatment includes statins. The current treatment provides moderate improvement of lipids. Compliance problems include adherence to diet and adherence to exercise.   Gout Patient had a flare up several months ago. Took colchicine and cleared up. No flare-ups since then GERD Currently on omeprazole daily- works well to keep symptoms under control  Review of Systems  Constitutional: Negative.   HENT: Negative.   Respiratory: Negative for shortness of breath.   Cardiovascular: Negative for palpitations.  Genitourinary: Negative.    Musculoskeletal: Negative for myalgias.  Neurological: Negative for headaches.  Psychiatric/Behavioral: Negative.   All other systems reviewed and are negative.      Objective:   Physical Exam  Constitutional: He is oriented to person, place, and time. He appears well-developed and well-nourished.  HENT:  Head: Normocephalic.  Right Ear: External ear normal.  Left Ear: External ear normal.  Nose: Nose normal.  Mouth/Throat: Oropharynx is clear and moist.  Eyes: EOM are normal. Pupils are equal, round, and reactive to light.  Neck: Normal range of motion. Neck supple. No thyromegaly present.  Cardiovascular: Normal rate, regular rhythm, normal heart sounds and intact distal pulses.   No murmur heard. Pulmonary/Chest: Effort normal and breath sounds normal. He has no wheezes. He has no rales.  Abdominal: Soft. Bowel sounds are normal.  Genitourinary:  Genitourinary Comments: Patient refuses DRE.  Musculoskeletal: Normal range of motion.  Neurological: He is alert and oriented to person, place, and time.  Skin: Skin is warm and dry.  Psychiatric: He has a normal mood and affect. His behavior is normal. Judgment and thought content normal.   BP (!) 146/88   Pulse 78   Temp 97 F (36.1 C) (Oral)   Ht 5\' 8"  (1.727 m)   Wt 219 lb (99.3 kg)   BMI 33.30 kg/m        Assessment & Plan:  1. Essential hypertension Low sodium diet - Enalapril-Hydrochlorothiazide 5-12.5 MG tablet; Take 1.5 tablets by mouth daily.  Dispense: 135 tablet; Refill: 1  2. Gastroesophageal reflux disease without esophagitis Avoid spicy foods Do not eat 2 hours prior to bedtime - omeprazole (PRILOSEC) 40 MG capsule; Take 1  capsule (40 mg total) by mouth daily.  Dispense: 90 capsule; Refill: 1  3. BMI 33.0-33.9,adult Discussed diet and exercise for person with BMI >25 Will recheck weight in 3-6 months  4. Acute idiopathic gout of left foot  5. Mixed hyperlipidemia Low fat diet - pravastatin  (PRAVACHOL) 80 MG tablet; Take 1 tablet (80 mg total) by mouth daily.  Dispense: 90 tablet; Refill: 1    Labs pending Health maintenance reviewed Diet and exercise encouraged Continue all meds Follow up  In 6 months   [place], FNP

## 2016-06-09 NOTE — Addendum Note (Signed)
Addended by: [name] on: 06/09/2016 08:58 AM   Modules accepted: Orders

## 2016-06-22 ENCOUNTER — Other Ambulatory Visit: Payer: Self-pay | Admitting: Nurse Practitioner

## 2016-06-22 DIAGNOSIS — K219 Gastro-esophageal reflux disease without esophagitis: Secondary | ICD-10-CM

## 2016-06-30 DIAGNOSIS — I639 Cerebral infarction, unspecified: Secondary | ICD-10-CM

## 2016-06-30 HISTORY — DX: Cerebral infarction, unspecified: I63.9

## 2016-08-02 ENCOUNTER — Encounter (INDEPENDENT_AMBULATORY_CARE_PROVIDER_SITE_OTHER): Payer: Self-pay

## 2016-08-02 ENCOUNTER — Encounter: Payer: Self-pay | Admitting: Nurse Practitioner

## 2016-08-02 ENCOUNTER — Ambulatory Visit (INDEPENDENT_AMBULATORY_CARE_PROVIDER_SITE_OTHER): Payer: BLUE CROSS/BLUE SHIELD | Admitting: Nurse Practitioner

## 2016-08-02 VITALS — BP 132/85 | HR 75 | Temp 97.0°F | Ht 68.0 in | Wt 211.0 lb

## 2016-08-02 DIAGNOSIS — I10 Essential (primary) hypertension: Secondary | ICD-10-CM | POA: Diagnosis not present

## 2016-08-02 DIAGNOSIS — I6930 Unspecified sequelae of cerebral infarction: Secondary | ICD-10-CM | POA: Diagnosis not present

## 2016-08-02 DIAGNOSIS — Z09 Encounter for follow-up examination after completed treatment for conditions other than malignant neoplasm: Secondary | ICD-10-CM

## 2016-08-02 MED ORDER — CLOPIDOGREL BISULFATE 75 MG PO TABS: 75.0000 mg | ORAL_TABLET | Freq: Every day | ORAL | 1 refills | Status: DC

## 2016-08-02 MED ORDER — ENALAPRIL-HYDROCHLOROTHIAZIDE 10-25 MG PO TABS: 1.0000 | ORAL_TABLET | Freq: Every day | ORAL | 1 refills | Status: DC

## 2016-08-02 NOTE — Progress Notes (Signed)
   Subjective:    Patient ID: David Garza, male    DOB: Dec 13, 1950, 66 y.o.   MRN: [id number]  HPI Patient comes in today for hospital follow up- He went to [facility] ER on March 28,2018 and was diagnosed with having a stroke. Has right sided weakness and is currently walking with a walker. They added plavix to his meds. Increased enalapril and HCTZ to 10/25 daiy. Blood pressures at home have been running 048,G systolic   Review of Systems  Constitutional: Negative.   HENT: Negative.   Respiratory: Negative.   Cardiovascular: Negative.   Genitourinary: Negative.   Neurological: Negative.   Psychiatric/Behavioral: Negative.   All other systems reviewed and are negative.      Objective:   Physical Exam  Constitutional: He is oriented to person, place, and time. He appears well-developed and well-nourished. No distress.  Cardiovascular: Normal rate and regular rhythm.   Pulmonary/Chest: Effort normal and breath sounds normal.  Musculoskeletal:  Right grip weaker then left Right leg weaker then left  Neurological: He is alert and oriented to person, place, and time. No cranial nerve deficit.  Skin: Skin is warm.  Psychiatric: He has a normal mood and affect. His behavior is normal. Judgment and thought content normal.   BP 132/85   Pulse 75   Temp 97 F (36.1 C) (Oral)   Ht 5\' 8"  (1.727 m)   Wt 211 lb (95.7 kg)   BMI 32.08 kg/m       Assessment & Plan:  1. Hospital discharge follow-up hopsital records reviewed  2. Essential hypertension rx sent to mail order for new medication dose - enalapril-hydrochlorothiazide (VASERETIC) 10-25 MG tablet; Take 1 tablet by mouth daily.  Dispense: 90 tablet; Refill: 1  3. Late effect of cerebrovascular accident (CVA) Watch for bleeding Bruising may occur Start physical therapy when can get scheduled - clopidogrel (PLAVIX) 75 MG tablet; Take 1 tablet (75 mg total) by mouth daily.  Dispense: 90 tablet; Refill: 1 RTO  prn  Mary-Margaret Hassell Done, FNP

## 2016-08-02 NOTE — Patient Instructions (Signed)
Facts You Should Know Arm Care After A Stroke  After a Stroke or Brain Injury, it is common for your arm to come "out of socket"  This is because the muscles that hold your arm in your shoulder joint are weakened.  Your shoulder can be easily injured if it is moved improperly.  Injury to your shoulder is serious and can be prevented.   Never let your arm hang.  This may cause pain in the shoulder.  Support it on an arm rest or on pillows.  Never let anyone pull on your arm when helping you move in bed, during bathing or dressing, during bedpan placement or transfers.  Never let anyone grab under your arms to help pick you up or keep you from falling.  This may cause injury to your shoulder.  If you have any pain in your shoulder when you raise your arm, or when someone moves it for you, do not raise it past that point.   Performing movements at your shoulder should be done only after instruction from an occupational therapist, to be sure that you are or your caregiver is doing it safely.  Support and move your arm by holding at the elbow. 

## 2016-08-08 ENCOUNTER — Ambulatory Visit: Payer: BLUE CROSS/BLUE SHIELD | Attending: Internal Medicine | Admitting: Physical Therapy

## 2016-08-08 ENCOUNTER — Other Ambulatory Visit: Payer: Self-pay | Admitting: *Deleted

## 2016-08-08 ENCOUNTER — Telehealth: Payer: Self-pay | Admitting: Nurse Practitioner

## 2016-08-08 VITALS — BP 124/80

## 2016-08-08 DIAGNOSIS — R2681 Unsteadiness on feet: Secondary | ICD-10-CM | POA: Diagnosis present

## 2016-08-08 DIAGNOSIS — I6930 Unspecified sequelae of cerebral infarction: Secondary | ICD-10-CM

## 2016-08-08 DIAGNOSIS — M6281 Muscle weakness (generalized): Secondary | ICD-10-CM | POA: Diagnosis present

## 2016-08-08 MED ORDER — CLOPIDOGREL BISULFATE 75 MG PO TABS: 75.0000 mg | ORAL_TABLET | Freq: Every day | ORAL | 1 refills | Status: DC

## 2016-08-08 MED ORDER — CLOPIDOGREL BISULFATE 75 MG PO TABS: 75.0000 mg | ORAL_TABLET | Freq: Every day | ORAL | 0 refills | Status: DC

## 2016-08-08 NOTE — Therapy (Signed)
[facility] [place], [place], [address] Phone: [phone]   Fax:  [phone]  Physical Therapy Evaluation  Patient Details  Name: David Garza MRN: [id number] Date of Birth: [birth year] Referring Provider: Kerri Perches MD  Encounter Date: 08/08/2016      PT End of Session - 08/08/16 1046    Visit Number 1   Number of Visits 16   Date for PT Re-Evaluation 10/03/16   PT Start Time 0900   PT Stop Time 0950   PT Time Calculation (min) 50 min   Activity Tolerance Patient tolerated treatment well   Behavior During Therapy [facility] for tasks assessed/performed      Past Medical History:  Diagnosis Date  . Hyperlipidemia   . Hypertension   . Metabolic syndrome     No past surgical history on file.  Vitals:   08/08/16 1051  BP: 124/80         Subjective Assessment - 08/08/16 0945    Subjective The patient was admitted to the hospital on 07/28/16 having suffer a stroke.  His entire right side has been affected.  He was discharged from the hospital on 07/31/16.  He was seen by a therapist and has been very compliant to his HEP.  He was using a walker but upon presentation to the clinic today with was walking with HHA provided by his wife.  His wife is also checking his BP three times a day.  His BP today was 124/80.     Patient is accompained by: Family member  Wife.   Pertinent History Right shoulder reconstruction.   How long can you stand comfortably? 5 minutes.   Patient Stated Goals Want to ger back to normal and do what I was doing before.   Currently in Pain? Other (Comment)  C/o a tingling in right UE and right LE especially his right heel.     Treatment:  Level 3 Nustep x 10 minutes.                               PT Long Term Goals - 08/08/16 1052      PT LONG TERM GOAL #1   Title Independent with a HEP.   Time 8   Period Weeks   Status New     PT LONG TERM GOAL #2   Title 5/5 right U and LE strength.   Time 8   Period Weeks   Status New     PT LONG TERM GOAL #3   Title Right grip= 70#.   Time 8   Period Weeks   Status New     PT LONG TERM GOAL #4   Title Patient walk unassisted without deviation.   Time 8   Period Weeks   Status New               Plan - 08/08/16 1048    Clinical Impression Statement The patient presents to OPPT with a dx of a stroke.  The stroke has affected his right side but he and his wife is seeing good improvement already.  He is weak over his right UE and right LE and his gait and balance are affected.  Patient will benefit from skilled physical therapy.   Rehab Potential Excellent   PT Frequency 2x / week   PT Duration 8 weeks   PT Next Visit Plan Right U and LE strengthening.  Gait  and balance activites.   Consulted and Agree with Plan of Care Patient      Patient will benefit from skilled therapeutic intervention in order to improve the following deficits and impairments:  Decreased activity tolerance, Decreased balance, Decreased mobility, Abnormal gait  Visit Diagnosis: Unsteadiness on feet - Plan: PT plan of care cert/re-cert  Muscle weakness (generalized) - Plan: PT plan of care cert/re-cert     Problem List Patient Active Problem List   Diagnosis Date Noted  . Gastroesophageal reflux disease without esophagitis 03/06/2015  . BMI 33.0-33.9,adult 03/06/2015  . Hypertension 09/17/2012  . Hyperlipidemia 09/17/2012  . Gout 09/17/2012    Carmichael Burdette, [place] MPT 08/09/2016, 10:13 AM  [facility] [place], [place], [address] Phone: [phone]   Fax:  [phone]  Name: David Garza MRN: [id number] Date of Birth: [birth year]

## 2016-08-08 NOTE — Telephone Encounter (Signed)
Medication sent to [facility] pharmacy. 

## 2016-08-10 ENCOUNTER — Ambulatory Visit: Payer: BLUE CROSS/BLUE SHIELD | Admitting: Physical Therapy

## 2016-08-10 ENCOUNTER — Encounter: Payer: Self-pay | Admitting: Physical Therapy

## 2016-08-10 VITALS — BP 135/79 | HR 79

## 2016-08-10 DIAGNOSIS — R2681 Unsteadiness on feet: Secondary | ICD-10-CM

## 2016-08-10 DIAGNOSIS — M6281 Muscle weakness (generalized): Secondary | ICD-10-CM

## 2016-08-10 NOTE — Therapy (Signed)
[facility] [place], [place], [address] Phone: [phone]   Fax:  [phone]  Physical Therapy Treatment  Patient Details  Name: David Garza MRN: [id number] Date of Birth: 09/01/1950 Referring Provider: Kerri Perches MD  Encounter Date: 08/10/2016      PT End of Session - 08/10/16 0821    Visit Number 2   Number of Visits 16   Date for PT Re-Evaluation 10/03/16   PT Start Time 0818   PT Stop Time 0900   PT Time Calculation (min) 42 min   Activity Tolerance Patient tolerated treatment well   Behavior During Therapy [facility] for tasks assessed/performed      Past Medical History:  Diagnosis Date  . Hyperlipidemia   . Hypertension   . Metabolic syndrome     No past surgical history on file.  Vitals:   08/10/16 0820  BP: 135/79  Pulse: 79        Subjective Assessment - 08/10/16 0814    Subjective Reports that tingling in RUE hasn't gone away yet. Reports that he feels like his R arm has shingles as he has had shingles before. Reports he has been walking around home and has a stress ball he has been using at home as well as finger opposition.   Patient is accompained by: Family member  WIfe   Pertinent History Right shoulder reconstruction.   How long can you stand comfortably? 5 minutes.   Patient Stated Goals Want to ger back to normal and do what I was doing before.   Currently in Pain? Yes   Pain Score --  "its pretty rough"   Pain Location Arm   Pain Orientation Right   Pain Descriptors / Indicators Tingling   Pain Onset 1 to 4 weeks ago   Pain Frequency Constant            [facility] PT Assessment - 08/10/16 0001      Assessment   Medical Diagnosis New stroke.   Onset Date/Surgical Date 07/28/16   Hand Dominance Right   Next MD Visit 11/2016     Precautions   Precautions Fall     Restrictions   Weight Bearing Restrictions No     ROM / Strength   AROM / PROM / Strength Strength     Strength   Overall Strength Deficits   Strength Assessment Site Hand   Right/Left hand Right;Left   Right Hand Grip (lbs) 70   Left Hand Grip (lbs) 100                     [facility] Adult PT Treatment/Exercise - 08/10/16 0001      Exercises   Exercises Shoulder;Knee/Hip     Knee/Hip Exercises: Aerobic   Nustep L3 x15 min  98%, 82 bpm     Knee/Hip Exercises: Machines for Strengthening   Cybex Knee Extension 10# 3x10 reps   Cybex Knee Flexion 30# 3x10 reps     Knee/Hip Exercises: Standing   Forward Step Up Right;3 sets;10 reps;Hand Hold: 1;Step Height: 6"     Knee/Hip Exercises: Seated   Sit to Sand 20 reps;without UE support     Shoulder Exercises: ROM/Strengthening   Wall Pushups 20 reps                     PT Long Term Goals - 08/08/16 1052      PT LONG TERM GOAL #1   Title Independent with a  HEP.   Time 8   Period Weeks   Status New     PT LONG TERM GOAL #2   Title 5/5 right U and LE strength.   Time 8   Period Weeks   Status New     PT LONG TERM GOAL #3   Title Right grip= 70#.   Time 8   Period Weeks   Status New     PT LONG TERM GOAL #4   Title Patient walk unassisted without deviation.   Time 8   Period Weeks   Status New               Plan - 08/10/16 0925    Clinical Impression Statement Patient tolerated today's treatment very well with fatigue noted during treatment predominatley following NuStep. Patient did very well on machine knee strengthening with VCs to attempt 50/50 distribution between both LEs. TIngling is continued throughout patient's R side which he states is not intense upon waking but intensifies as his activity in the mornings increased per patient report. Patient encouraged throughout forward step ups to complete functionally. Patient able to complete sit to stands from low plinth table with VCs to find balance as he stands. Patient cautioned to report any R shoulder discomfort with wall pushups and  did fairly well with UE weight distribution. Patient and wife both encouraged to continue walking and attempting actvities such as writing and eating with utensil in RUE. Vitals taken following treatment were 138/84 with 99 bpm.   Rehab Potential Excellent   PT Frequency 2x / week   PT Duration 8 weeks   PT Next Visit Plan Right U and LE strengthening.  Gait and balance activites.   Consulted and Agree with Plan of Care Patient      Patient will benefit from skilled therapeutic intervention in order to improve the following deficits and impairments:  Decreased activity tolerance, Decreased balance, Decreased mobility, Abnormal gait  Visit Diagnosis: Unsteadiness on feet  Muscle weakness (generalized)     Problem List Patient Active Problem List   Diagnosis Date Noted  . Gastroesophageal reflux disease without esophagitis 03/06/2015  . BMI 33.0-33.9,adult 03/06/2015  . Hypertension 09/17/2012  . Hyperlipidemia 09/17/2012  . Gout 09/17/2012    Wynelle Fanny, PTA 08/10/2016, 9:50 AM  [facility] [address] [place], [place], [address] Phone: [phone]   Fax:  [phone]  Name: David Garza MRN: [id number] Date of Birth: 09-Feb-1951

## 2016-08-15 ENCOUNTER — Ambulatory Visit (INDEPENDENT_AMBULATORY_CARE_PROVIDER_SITE_OTHER): Payer: BLUE CROSS/BLUE SHIELD | Admitting: Nurse Practitioner

## 2016-08-15 ENCOUNTER — Encounter: Payer: Self-pay | Admitting: Physical Therapy

## 2016-08-15 ENCOUNTER — Encounter: Payer: Self-pay | Admitting: Nurse Practitioner

## 2016-08-15 ENCOUNTER — Ambulatory Visit: Payer: BLUE CROSS/BLUE SHIELD | Admitting: Physical Therapy

## 2016-08-15 VITALS — BP 122/76

## 2016-08-15 VITALS — BP 122/87 | HR 82 | Temp 97.2°F | Ht 68.0 in | Wt 208.0 lb

## 2016-08-15 DIAGNOSIS — R2681 Unsteadiness on feet: Secondary | ICD-10-CM | POA: Diagnosis not present

## 2016-08-15 DIAGNOSIS — M6281 Muscle weakness (generalized): Secondary | ICD-10-CM

## 2016-08-15 DIAGNOSIS — I10 Essential (primary) hypertension: Secondary | ICD-10-CM

## 2016-08-15 MED ORDER — LISINOPRIL-HYDROCHLOROTHIAZIDE 20-25 MG PO TABS: 1.0000 | ORAL_TABLET | Freq: Every day | ORAL | 3 refills | Status: DC

## 2016-08-15 NOTE — Progress Notes (Signed)
   Subjective:    Patient ID: David Garza, male    DOB: 1951/01/13, 66 y.o.   MRN: [id number]  HPI  Patient in today with concerned about patient blood pressure. He had a stroke several weeks ago and is current;y in rehab. His wife checks his blood pressure several times a day and it has been averaging in 530'Y systolic. Srems to be higher when he is sitting up vs laying down.He is currently on vaseretic 10/25 1 daily.   Review of Systems  Eyes: Negative for visual disturbance.  Respiratory: Negative.   Cardiovascular: Negative.   Neurological: Negative for dizziness and headaches.  Psychiatric/Behavioral: Negative.   All other systems reviewed and are negative.      Objective:   Physical Exam  Constitutional: He appears well-developed and well-nourished. No distress.  Cardiovascular: Normal rate and regular rhythm.   Pulmonary/Chest: Effort normal and breath sounds normal.  Musculoskeletal:  Right sided weakness of upper and lower ext.    BP 122/87   Pulse 82   Temp 97.2 F (36.2 C) (Oral)   Ht 5\' 8"  (1.727 m)   Wt 208 lb (94.3 kg)   BMI 31.63 kg/m        Assessment & Plan:  1. Essential hypertension Low sodium diet Continue to keep diary of blood pressure Changed from enalapril 10/25 to lisinopril 20/25 - lisinopril-hydrochlorothiazide (PRINZIDE,ZESTORETIC) 20-25 MG tablet; Take 1 tablet by mouth daily.  Dispense: 30 tablet; Refill: [place], FNP

## 2016-08-15 NOTE — Therapy (Signed)
[facility] [place], [place], [address] Phone: [phone]   Fax:  [phone]  Physical Therapy Treatment  Patient Details  Name: David Garza MRN: [id number] Date of Birth: 1950/11/03 Referring Provider: Kerri Perches MD  Encounter Date: 08/15/2016      PT End of Session - 08/15/16 0919    Visit Number 3   Number of Visits 16   Date for PT Re-Evaluation 10/03/16   PT Start Time 0904   PT Stop Time 0947   PT Time Calculation (min) 43 min   Activity Tolerance Patient tolerated treatment well   Behavior During Therapy [facility] for tasks assessed/performed      Past Medical History:  Diagnosis Date  . Hyperlipidemia   . Hypertension   . Metabolic syndrome     No past surgical history on file.  Vitals:   08/15/16 0904  BP: 122/76        Subjective Assessment - 08/15/16 0904    Subjective Reports that he mowed his yard friday and seems like over the weekend the tingling along R side has gotten worse. Reports a spike in BP last friday and saturday mornings. Reports taking standing shower with wife in room as balance is poor but improving. Now able to ascend stairs at home on his own.   Patient is accompained by: Family member  Wife   Pertinent History Right shoulder reconstruction.   How long can you stand comfortably? 5 minutes.   Patient Stated Goals Want to ger back to normal and do what I was doing before.   Currently in Pain? Yes   Pain Score --  "its bad"   Pain Location Generalized   Pain Orientation Right   Pain Descriptors / Indicators Tingling   Pain Onset 1 to 4 weeks ago   Pain Frequency Constant            [facility] PT Assessment - 08/15/16 0001      Assessment   Medical Diagnosis New stroke.   Onset Date/Surgical Date 07/28/16   Hand Dominance Right   Next MD Visit 11/2016     Precautions   Precautions Fall     Restrictions   Weight Bearing Restrictions No                      [facility] Adult PT Treatment/Exercise - 08/15/16 0001      Exercises   Exercises Knee/Hip;Hand     Knee/Hip Exercises: Aerobic   Nustep L3 x15 min  96%, 87 bpm     Knee/Hip Exercises: Machines for Strengthening   Cybex Knee Extension 10# 3x10 reps   Cybex Knee Flexion 30# 3x10 reps     Knee/Hip Exercises: Standing   Forward Step Up Right;3 sets;10 reps;Hand Hold: 0;Step Height: 6"     Knee/Hip Exercises: Seated   Sit to Sand 20 reps;without UE support     Hand Exercises   Other Hand Exercises Multiple reps with multiple size rollers on velcro board   Other Hand Exercises R hand web grip x30 reps                     PT Long Term Goals - 08/08/16 1052      PT LONG TERM GOAL #1   Title Independent with a HEP.   Time 8   Period Weeks   Status New     PT LONG TERM GOAL #2   Title 5/5 right U  and LE strength.   Time 8   Period Weeks   Status New     PT LONG TERM GOAL #3   Title Right grip= 70#.   Time 8   Period Weeks   Status New     PT LONG TERM GOAL #4   Title Patient walk unassisted without deviation.   Time 8   Period Weeks   Status New               Plan - 08/15/16 0959    Clinical Impression Statement Patient tolerated today's treatment well and an improvement noted with patient's ambulation. Patient observed with decreased R knee buckling but still has wide BOS. Improvement with machine knee strengthening as he did not report fatigue as he did in previous treatment. Patient able to complete forward step ups without UE support but with hands close to // bars for safety. Patient guided through various grip exercises with different handles to simulate work acitvities. Vitals taken following treatment were 97%, 87 bpm.   Rehab Potential Excellent   PT Frequency 2x / week   PT Duration 8 weeks   PT Next Visit Plan Right U and LE strengthening.  Gait and balance activites.   Consulted and Agree with Plan of Care  Patient      Patient will benefit from skilled therapeutic intervention in order to improve the following deficits and impairments:  Decreased activity tolerance, Decreased balance, Decreased mobility, Abnormal gait  Visit Diagnosis: Unsteadiness on feet  Muscle weakness (generalized)     Problem List Patient Active Problem List   Diagnosis Date Noted  . Gastroesophageal reflux disease without esophagitis 03/06/2015  . BMI 33.0-33.9,adult 03/06/2015  . Hypertension 09/17/2012  . Hyperlipidemia 09/17/2012  . Gout 09/17/2012    Wynelle Fanny, PTA 08/15/2016, 10:04 AM  [facility] [address] [place], [place], [address] Phone: [phone]   Fax:  [phone]  Name: David Garza MRN: [id number] Date of Birth: 29-Oct-1950

## 2016-08-15 NOTE — Patient Instructions (Signed)
DASH Eating Plan DASH stands for "Dietary Approaches to Stop Hypertension." The DASH eating plan is a healthy eating plan that has been shown to reduce high blood pressure (hypertension). It may also reduce your risk for type 2 diabetes, heart disease, and stroke. The DASH eating plan may also help with weight loss. What are tips for following this plan? General guidelines  Avoid eating more than 2,300 mg (milligrams) of salt (sodium) a day. If you have hypertension, you may need to reduce your sodium intake to 1,500 mg a day.  Limit alcohol intake to no more than 1 drink a day for nonpregnant women and 2 drinks a day for men. One drink equals 12 oz of beer, 5 oz of wine, or 1 oz of hard liquor.  Work with your health care provider to maintain a healthy body weight or to lose weight. Ask what an ideal weight is for you.  Get at least 30 minutes of exercise that causes your heart to beat faster (aerobic exercise) most days of the week. Activities may include walking, swimming, or biking.  Work with your health care provider or diet and nutrition specialist (dietitian) to adjust your eating plan to your individual calorie needs. Reading food labels  Check food labels for the amount of sodium per serving. Choose foods with less than 5 percent of the Daily Value of sodium. Generally, foods with less than 300 mg of sodium per serving fit into this eating plan.  To find whole grains, look for the word "whole" as the first word in the ingredient list. Shopping  Buy products labeled as "low-sodium" or "no salt added."  Buy fresh foods. Avoid canned foods and premade or frozen meals. Cooking  Avoid adding salt when cooking. Use salt-free seasonings or herbs instead of table salt or sea salt. Check with your health care provider or pharmacist before using salt substitutes.  Do not fry foods. Cook foods using healthy methods such as baking, boiling, grilling, and broiling instead.  Cook with  heart-healthy oils, such as olive, canola, soybean, or sunflower oil. Meal planning   Eat a balanced diet that includes: ? 5 or more servings of fruits and vegetables each day. At each meal, try to fill half of your plate with fruits and vegetables. ? Up to 6-8 servings of whole grains each day. ? Less than 6 oz of lean meat, poultry, or fish each day. A 3-oz serving of meat is about the same size as a deck of cards. One egg equals 1 oz. ? 2 servings of low-fat dairy each day. ? A serving of nuts, seeds, or beans 5 times each week. ? Heart-healthy fats. Healthy fats called Omega-3 fatty acids are found in foods such as flaxseeds and coldwater fish, like sardines, salmon, and mackerel.  Limit how much you eat of the following: ? Canned or prepackaged foods. ? Food that is high in trans fat, such as fried foods. ? Food that is high in saturated fat, such as fatty meat. ? Sweets, desserts, sugary drinks, and other foods with added sugar. ? Full-fat dairy products.  Do not salt foods before eating.  Try to eat at least 2 vegetarian meals each week.  Eat more home-cooked food and less restaurant, buffet, and fast food.  When eating at a restaurant, ask that your food be prepared with less salt or no salt, if possible. What foods are recommended? The items listed may not be a complete list. Talk with your dietitian about what   dietary choices are best for you. Grains Whole-grain or whole-wheat bread. Whole-grain or whole-wheat pasta. Brown rice. Oatmeal. Quinoa. Bulgur. Whole-grain and low-sodium cereals. Pita bread. Low-fat, low-sodium crackers. Whole-wheat flour tortillas. Vegetables Fresh or frozen vegetables (raw, steamed, roasted, or grilled). Low-sodium or reduced-sodium tomato and vegetable juice. Low-sodium or reduced-sodium tomato sauce and tomato paste. Low-sodium or reduced-sodium canned vegetables. Fruits All fresh, dried, or frozen fruit. Canned fruit in natural juice (without  added sugar). Meat and other protein foods Skinless chicken or turkey. Ground chicken or turkey. Pork with fat trimmed off. Fish and seafood. Egg whites. Dried beans, peas, or lentils. Unsalted nuts, nut butters, and seeds. Unsalted canned beans. Lean cuts of beef with fat trimmed off. Low-sodium, lean deli meat. Dairy Low-fat (1%) or fat-free (skim) milk. Fat-free, low-fat, or reduced-fat cheeses. Nonfat, low-sodium ricotta or cottage cheese. Low-fat or nonfat yogurt. Low-fat, low-sodium cheese. Fats and oils Soft margarine without trans fats. Vegetable oil. Low-fat, reduced-fat, or light mayonnaise and salad dressings (reduced-sodium). Canola, safflower, olive, soybean, and sunflower oils. Avocado. Seasoning and other foods Herbs. Spices. Seasoning mixes without salt. Unsalted popcorn and pretzels. Fat-free sweets. What foods are not recommended? The items listed may not be a complete list. Talk with your dietitian about what dietary choices are best for you. Grains Baked goods made with fat, such as croissants, muffins, or some breads. Dry pasta or rice meal packs. Vegetables Creamed or fried vegetables. Vegetables in a cheese sauce. Regular canned vegetables (not low-sodium or reduced-sodium). Regular canned tomato sauce and paste (not low-sodium or reduced-sodium). Regular tomato and vegetable juice (not low-sodium or reduced-sodium). Pickles. Olives. Fruits Canned fruit in a light or heavy syrup. Fried fruit. Fruit in cream or butter sauce. Meat and other protein foods Fatty cuts of meat. Ribs. Fried meat. Bacon. Sausage. Bologna and other processed lunch meats. Salami. Fatback. Hotdogs. Bratwurst. Salted nuts and seeds. Canned beans with added salt. Canned or smoked fish. Whole eggs or egg yolks. Chicken or turkey with skin. Dairy Whole or 2% milk, cream, and half-and-half. Whole or full-fat cream cheese. Whole-fat or sweetened yogurt. Full-fat cheese. Nondairy creamers. Whipped toppings.  Processed cheese and cheese spreads. Fats and oils Butter. Stick margarine. Lard. Shortening. Ghee. Bacon fat. Tropical oils, such as coconut, palm kernel, or palm oil. Seasoning and other foods Salted popcorn and pretzels. Onion salt, garlic salt, seasoned salt, table salt, and sea salt. Worcestershire sauce. Tartar sauce. Barbecue sauce. Teriyaki sauce. Soy sauce, including reduced-sodium. Steak sauce. Canned and packaged gravies. Fish sauce. Oyster sauce. Cocktail sauce. Horseradish that you find on the shelf. Ketchup. Mustard. Meat flavorings and tenderizers. Bouillon cubes. Hot sauce and Tabasco sauce. Premade or packaged marinades. Premade or packaged taco seasonings. Relishes. Regular salad dressings. Where to find more information:  National Heart, Lung, and Blood Institute: www.nhlbi.nih.gov  American Heart Association: www.heart.org Summary  The DASH eating plan is a healthy eating plan that has been shown to reduce high blood pressure (hypertension). It may also reduce your risk for type 2 diabetes, heart disease, and stroke.  With the DASH eating plan, you should limit salt (sodium) intake to 2,300 mg a day. If you have hypertension, you may need to reduce your sodium intake to 1,500 mg a day.  When on the DASH eating plan, aim to eat more fresh fruits and vegetables, whole grains, lean proteins, low-fat dairy, and heart-healthy fats.  Work with your health care provider or diet and nutrition specialist (dietitian) to adjust your eating plan to your individual   calorie needs. This information is not intended to replace advice given to you by your health care provider. Make sure you discuss any questions you have with your health care provider. Document Released: 04/07/2011 Document Revised: 04/11/2016 Document Reviewed: 04/11/2016 Elsevier Interactive Patient Education  2017 Elsevier Inc.  

## 2016-08-17 ENCOUNTER — Ambulatory Visit: Payer: BLUE CROSS/BLUE SHIELD | Admitting: Physical Therapy

## 2016-08-17 VITALS — BP 108/73

## 2016-08-17 DIAGNOSIS — M6281 Muscle weakness (generalized): Secondary | ICD-10-CM

## 2016-08-17 DIAGNOSIS — R2681 Unsteadiness on feet: Secondary | ICD-10-CM | POA: Diagnosis not present

## 2016-08-17 NOTE — Therapy (Signed)
[facility] [place], [place], [address] Phone: [phone]   Fax:  [phone]  Physical Therapy Treatment  Patient Details  Name: David Garza MRN: [id number] Date of Birth: [birth year] Referring Provider: Kerri Perches MD  Encounter Date: 08/17/2016      PT End of Session - 08/17/16 1014    Visit Number 4   Number of Visits 16   Date for PT Re-Evaluation 10/03/16   PT Start Time 0900   PT Stop Time 0951   PT Time Calculation (min) 51 min   Activity Tolerance Treatment limited secondary to medical complications (Comment)   Behavior During Therapy [facility] for tasks assessed/performed      Past Medical History:  Diagnosis Date  . Hyperlipidemia   . Hypertension   . Metabolic syndrome     No past surgical history on file.  Vitals:   08/17/16 0919  BP: 108/73        Subjective Assessment - 08/17/16 0919    Subjective I'm on a new blood pressure medication.                         [facility] Adult PT Treatment/Exercise - 08/17/16 0001      Neuro Re-ed    Neuro Re-ed Details  resisted 4 way walking with orange XTS band 2 minutes each way (8 minutes total),     Exercises   Exercises Knee/Hip     Knee/Hip Exercises: Aerobic   Nustep Level 5 x 15 minutes.     Knee/Hip Exercises: Machines for Strengthening   Cybex Knee Extension 10# x 5 minutes.   Cybex Knee Flexion 30# x 5 minutes.   Cybex Leg Press 3 plates x 79minutes.                     PT Long Term Goals - 08/08/16 1052      PT LONG TERM GOAL #1   Title Independent with a HEP.   Time 8   Period Weeks   Status New     PT LONG TERM GOAL #2   Title 5/5 right U and LE strength.   Time 8   Period Weeks   Status New     PT LONG TERM GOAL #3   Title Right grip= 70#.   Time 8   Period Weeks   Status New     PT LONG TERM GOAL #4   Title Patient walk unassisted without deviation.   Time 8   Period Weeks   Status New               Plan - 08/17/16 0940    Clinical Impression Statement BP post exercise 105/75.      Patient will benefit from skilled therapeutic intervention in order to improve the following deficits and impairments:     Visit Diagnosis: Unsteadiness on feet  Muscle weakness (generalized)     Problem List Patient Active Problem List   Diagnosis Date Noted  . Gastroesophageal reflux disease without esophagitis 03/06/2015  . BMI 33.0-33.9,adult 03/06/2015  . Hypertension 09/17/2012  . Hyperlipidemia 09/17/2012  . Gout 09/17/2012    Gryphon Vanderveen, [place] MPT 08/17/2016, 10:17 AM  [facility] [place], [place], [address] Phone: [phone]   Fax:  [phone]  Name: David Garza MRN: [id number] Date of Birth: 02-12-1951

## 2016-08-22 ENCOUNTER — Ambulatory Visit: Payer: BLUE CROSS/BLUE SHIELD | Admitting: Physical Therapy

## 2016-08-22 ENCOUNTER — Encounter: Payer: Self-pay | Admitting: Physical Therapy

## 2016-08-22 DIAGNOSIS — R2681 Unsteadiness on feet: Secondary | ICD-10-CM

## 2016-08-22 DIAGNOSIS — M6281 Muscle weakness (generalized): Secondary | ICD-10-CM

## 2016-08-22 NOTE — Therapy (Signed)
[facility] [place], [place], [address] Phone: [phone]   Fax:  [phone]  Physical Therapy Treatment  Patient Details  Name: David Garza MRN: [id number] Date of Birth: Jan 18, 1951 Referring Provider: Kerri Perches MD  Encounter Date: 08/22/2016      PT End of Session - 08/22/16 0938    Visit Number 5   Number of Visits 16   Date for PT Re-Evaluation 10/03/16   PT Start Time 0900   PT Stop Time 0944   PT Time Calculation (min) 44 min   Activity Tolerance Patient tolerated treatment well   Behavior During Therapy [facility] for tasks assessed/performed      Past Medical History:  Diagnosis Date  . Hyperlipidemia   . Hypertension   . Metabolic syndrome     History reviewed. No pertinent surgical history.  There were no vitals filed for this visit.      Subjective Assessment - 08/22/16 0901    Subjective Patient arrived with more tingling in right hand more than usual today for unknown reason   Patient is accompained by: Family member   Pertinent History Right shoulder reconstruction.   How long can you stand comfortably? 5 minutes.   Patient Stated Goals Want to ger back to normal and do what I was doing before.   Currently in Pain? No/denies   Pain Score 0-No pain   Pain Location Generalized   Pain Orientation Right   Pain Descriptors / Indicators Tingling   Pain Onset 1 to 4 weeks ago   Pain Frequency Intermittent                         [facility] Adult PT Treatment/Exercise - 08/22/16 0001      Knee/Hip Exercises: Aerobic   Nustep Level 5 x 15 minutes.     Knee/Hip Exercises: Machines for Strengthening   Cybex Knee Extension 10# 3x10   Cybex Knee Flexion 30# 3x10     Knee/Hip Exercises: Standing   Lateral Step Up Right;2 sets;10 reps;Hand Hold: 0;Step Height: 6"   Forward Step Up Right;2 sets;10 reps;Hand Hold: 0;Step Height: 6"   Rocker Board 3 minutes     Knee/Hip  Exercises: Supine   Bridges Limitations 2x10   Other Supine Knee/Hip Exercises supine hip abd with green t-band x30 right LE focus     Hand Exercises   Other Hand Exercises digiflex 1.5 each finger then all x30 each   Other Hand Exercises R hand web grip x30 reps/flexbar green x30                     PT Long Term Goals - 08/22/16 0931      PT LONG TERM GOAL #1   Title Independent with a HEP.   Time 8   Period Weeks   Status On-going     PT LONG TERM GOAL #2   Title 5/5 right U and LE strength.   Time 8   Period Weeks   Status On-going     PT LONG TERM GOAL #3   Title Right grip= 70#.   Time 8   Period Weeks   Status On-going  60# 08/22/16     PT LONG TERM GOAL #4   Title Patient walk unassisted without deviation.   Time 8   Period Weeks   Status On-going               Plan -  08/22/16 0301    Clinical Impression Statement Patient tolerated treatment well today. Patient progressing with right UE and LE strengtheing with no difficulty. Patient reported feeling fatigue after each day of activity yet does a lot at home with farm and yard. Patient increased grip strength in right UE to 60# today. Patient vitals WNL today. Goals ongoing due to strength deficts.    Rehab Potential Excellent   PT Frequency 2x / week   PT Duration 8 weeks   PT Next Visit Plan Right U and LE strengthening.  Gait and balance activites. (Patient may have shot in knee and may need to avoid LE exercise for 72 after)   Consulted and Agree with Plan of Care Patient      Patient will benefit from skilled therapeutic intervention in order to improve the following deficits and impairments:  Decreased activity tolerance, Decreased balance, Decreased mobility, Abnormal gait  Visit Diagnosis: Unsteadiness on feet  Muscle weakness (generalized)     Problem List Patient Active Problem List   Diagnosis Date Noted  . Gastroesophageal reflux disease without esophagitis 03/06/2015   . BMI 33.0-33.9,adult 03/06/2015  . Hypertension 09/17/2012  . Hyperlipidemia 09/17/2012  . Gout 09/17/2012    Phillips Climes, PTA 08/22/2016, 9:51 AM  [facility] [place], [place], [address] Phone: [phone]   Fax:  [phone]  Name: David Garza MRN: [id number] Date of Birth: [birth year]

## 2016-08-23 ENCOUNTER — Encounter: Payer: Self-pay | Admitting: Nurse Practitioner

## 2016-08-23 ENCOUNTER — Ambulatory Visit (INDEPENDENT_AMBULATORY_CARE_PROVIDER_SITE_OTHER): Payer: BLUE CROSS/BLUE SHIELD | Admitting: Nurse Practitioner

## 2016-08-23 VITALS — BP 128/86 | HR 85 | Temp 97.1°F | Ht 68.0 in | Wt 208.0 lb

## 2016-08-23 DIAGNOSIS — M25561 Pain in right knee: Secondary | ICD-10-CM | POA: Diagnosis not present

## 2016-08-23 MED ORDER — METHYLPREDNISOLONE ACETATE 40 MG/ML IJ SUSP
40.0000 mg | Freq: Once | INTRAMUSCULAR | Status: AC
  Administered 2016-08-23: 40 mg via INTRA_ARTICULAR

## 2016-08-23 MED ORDER — BUPIVACAINE HCL 0.25 % IJ SOLN
1.0000 mL | Freq: Once | INTRAMUSCULAR | Status: AC
  Administered 2016-08-23: 1 mL via INTRA_ARTICULAR

## 2016-08-23 NOTE — Patient Instructions (Signed)
Knee Injection, Care After Refer to this sheet in the next few weeks. These instructions provide you with information about caring for yourself after your procedure. Your health care provider may also give you more specific instructions. Your treatment has been planned according to current medical practices, but problems sometimes occur. Call your health care provider if you have any problems or questions after your procedure. What can I expect after the procedure? After the procedure, it is common to have:  Soreness.  Warmth.  Swelling. You may have more pain, swelling, and warmth than you did before the injection. This reaction may last for about one day. Follow these instructions at home: Bathing   If you were given a bandage (dressing), keep it dry until your health care provider says it can be removed. Ask your health care provider when you can start showering or taking a bath. Managing pain, stiffness, and swelling   If directed, apply ice to the injection area:  Put ice in a plastic bag.  Place a towel between your skin and the bag.  Leave the ice on for 20 minutes, 2-3 times per day.  Do not apply heat to your knee.  Raise the injection area above the level of your heart while you are sitting or lying down. Activity   Avoid strenuous activities for as long as directed by your health care provider. Ask your health care provider when you can return to your normal activities. General instructions   Take medicines only as directed by your health care provider.  Do not take aspirin or other over-the-counter medicines unless your health care provider says you can.  Check your injection site every day for signs of infection. Watch for:  Redness, swelling, or pain.  Fluid, blood, or pus.  Follow your health care provider's instructions about dressing changes and removal. Contact a health care provider if:  You have symptoms at your injection site that last longer than  two days after your procedure.  You have redness, swelling, or pain in your injection area.  You have fluid, blood, or pus coming from your injection site.  You have warmth in your injection area.  You have a fever.  Your pain is not controlled with medicine. Get help right away if:  Your knee turns very red.  Your knee becomes very swollen.  Your knee pain is severe. This information is not intended to replace advice given to you by your health care provider. Make sure you discuss any questions you have with your health care provider. Document Released: 05/09/2014 Document Revised: 12/23/2015 Document Reviewed: 02/26/2014 Elsevier Interactive Patient Education  2017 Elsevier Inc.  

## 2016-08-23 NOTE — Progress Notes (Signed)
   Subjective:    Patient ID: David Garza, male    DOB: 03-30-1951, 66 y.o.   MRN: [id number]  HPI  Patient comes in c/o of right knee pain- has been going on for awhile - has been going for post stroke PT and that has caused knee to flare up more- " Pops and cracks". Has had it injected in the past.     Review of Systems  Respiratory: Negative.   Cardiovascular: Negative.   Musculoskeletal:       Right knee pain   Neurological: Negative.   Psychiatric/Behavioral: Negative.   All other systems reviewed and are negative.      Objective:   Physical Exam  Constitutional: He is oriented to person, place, and time. He appears well-developed and well-nourished. No distress.  Cardiovascular: Normal rate and regular rhythm.   Pulmonary/Chest: Effort normal and breath sounds normal.  Musculoskeletal:  Mild crepitus oan flexion and extension of right knee- no joint effusion All ligaments intact  Neurological: He is alert and oriented to person, place, and time.  Psychiatric: He has a normal mood and affect. His behavior is normal. Judgment and thought content normal.   BP 128/86   Pulse 85   Temp 97.1 F (36.2 C) (Oral)   Ht 5\' 8"  (1.727 m)   Wt 208 lb (94.3 kg)   BMI 31.63 kg/m   Right knee xray- joint space narrowing medially-Preliminary reading by Ronnald Collum, FNP  [facility]  Joint injection under sterile technique- patient tolerated well.      Assessment & Plan:   1. Acute pain of right knee    Ice BID Wrap when up walking Follow up prn  Mary-Margaret Hassell Done, FNP

## 2016-08-24 ENCOUNTER — Ambulatory Visit: Payer: BLUE CROSS/BLUE SHIELD | Admitting: Physical Therapy

## 2016-08-24 ENCOUNTER — Encounter: Payer: Self-pay | Admitting: Physical Therapy

## 2016-08-24 DIAGNOSIS — M6281 Muscle weakness (generalized): Secondary | ICD-10-CM

## 2016-08-24 DIAGNOSIS — R2681 Unsteadiness on feet: Secondary | ICD-10-CM

## 2016-08-24 NOTE — Therapy (Signed)
[facility] [place], [place], [address] Phone: [phone]   Fax:  [phone]  Physical Therapy Treatment  Patient Details  Name: David Garza MRN: [id number] Date of Birth: Jan 16, 1951 Referring Provider: Kerri Perches MD  Encounter Date: 08/24/2016      PT End of Session - 08/24/16 0934    Visit Number 6   Number of Visits 16   Date for PT Re-Evaluation 10/03/16   PT Start Time 0859   PT Stop Time 0941   PT Time Calculation (min) 42 min   Activity Tolerance Patient tolerated treatment well   Behavior During Therapy [facility] for tasks assessed/performed      Past Medical History:  Diagnosis Date  . Hyperlipidemia   . Hypertension   . Metabolic syndrome     History reviewed. No pertinent surgical history.  There were no vitals filed for this visit.      Subjective Assessment - 08/24/16 0904    Subjective Patient had a shot in knee yesterday. Ongoing tingling in hand   Patient is accompained by: Family member   Pertinent History Right shoulder reconstruction.   How long can you stand comfortably? 5 minutes.   Patient Stated Goals Want to ger back to normal and do what I was doing before.   Currently in Pain? No/denies   Pain Location Generalized   Pain Orientation Right   Pain Descriptors / Indicators Tingling   Pain Onset 1 to 4 weeks ago   Pain Frequency Intermittent                         [facility] Adult PT Treatment/Exercise - 08/24/16 0001      Exercises   Exercises Elbow;Wrist     Elbow Exercises   Elbow Flexion Strengthening;Left;Seated;Bar weights/barbell  3x10   Bar Weights/Barbell (Elbow Flexion) 4 lbs     Knee/Hip Exercises: Supine   Bridges Limitations 3x10     Shoulder Exercises: Standing   Protraction Strengthening;20 reps;Theraband;Right   Theraband Level (Shoulder Protraction) Level 1 (Yellow)   External Rotation Strengthening;Right;20 reps;Theraband   Theraband Level (Shoulder External Rotation) Level 1 (Yellow)   Internal Rotation Strengthening;Right;20 reps;Theraband   Theraband Level (Shoulder Internal Rotation) Level 1 (Yellow)   Row Strengthening;20 reps;Theraband;Right   Theraband Level (Shoulder Row) Level 1 (Yellow)     Shoulder Exercises: Body Blade   Other Body Blade Exercises hammer for supination/pronation x30   Other Body Blade Exercises roller board multiple size and reps for rolling     Hand Exercises   Rubberbands 2x5 each finger   Paperclip Maze paperclip pinching on paper x20 each finger   Other Hand Exercises digiflex 1.5 each finger then all x30 each   Other Hand Exercises R hand web grip x30 reps/flexbar green x30     Wrist Exercises   Wrist Radial Deviation Strengthening;Right  3x10   Bar Weights/Barbell (Radial Deviation) 2 lbs   Wrist Ulnar Deviation Strengthening;Left  3x10   Bar Weights/Barbell (Ulnar Deviation) 2 lbs   Other wrist exercises flexion/ext 2# 3x10                     PT Long Term Goals - 08/22/16 0931      PT LONG TERM GOAL #1   Title Independent with a HEP.   Time 8   Period Weeks   Status On-going     PT LONG TERM GOAL #2   Title 5/5 right  U and LE strength.   Time 8   Period Weeks   Status On-going     PT LONG TERM GOAL #3   Title Right grip= 70#.   Time 8   Period Weeks   Status On-going  60# 08/22/16     PT LONG TERM GOAL #4   Title Patient walk unassisted without deviation.   Time 8   Period Weeks   Status On-going               Plan - 08/24/16 0102    Clinical Impression Statement Patient tolerated treatment well today. Today focused on right UE strengthening due to having a shot in right knee. Patient has reported difficulty with small objects and gripping with his ADL's. Patient continues to use theraputty and ball squeeze daily. clothes pin given to patient to add to daily HEP. Current goalds ongoing due to strength deficts.    Rehab  Potential Excellent   PT Frequency 2x / week   PT Duration 8 weeks   PT Next Visit Plan Right U and LE strengthening.  Gait and balance activites.   Consulted and Agree with Plan of Care Patient      Patient will benefit from skilled therapeutic intervention in order to improve the following deficits and impairments:  Decreased activity tolerance, Decreased balance, Decreased mobility, Abnormal gait  Visit Diagnosis: Unsteadiness on feet  Muscle weakness (generalized)     Problem List Patient Active Problem List   Diagnosis Date Noted  . Gastroesophageal reflux disease without esophagitis 03/06/2015  . BMI 33.0-33.9,adult 03/06/2015  . Hypertension 09/17/2012  . Hyperlipidemia 09/17/2012  . Gout 09/17/2012    Ladean Raya, PTA 08/24/16 9:46 AM  [facility] [place], [place], [address] Phone: [phone]   Fax:  [phone]  Name: Yaroslav Gombos MRN: [id number] Date of Birth: 1950-07-08

## 2016-08-29 ENCOUNTER — Encounter: Payer: Self-pay | Admitting: Physical Therapy

## 2016-08-29 ENCOUNTER — Ambulatory Visit: Payer: BLUE CROSS/BLUE SHIELD | Admitting: Physical Therapy

## 2016-08-29 VITALS — HR 95

## 2016-08-29 DIAGNOSIS — R2681 Unsteadiness on feet: Secondary | ICD-10-CM

## 2016-08-29 DIAGNOSIS — M6281 Muscle weakness (generalized): Secondary | ICD-10-CM

## 2016-08-29 NOTE — Therapy (Signed)
[facility] [place], [place], [address] Phone: [phone]   Fax:  [phone]  Physical Therapy Treatment  Patient Details  Name: David Garza MRN: [id number] Date of Birth: 09/05/1950 Referring Provider: Kerri Perches MD  Encounter Date: 08/29/2016      PT End of Session - 08/29/16 1034    Visit Number 7   Number of Visits 16   Date for PT Re-Evaluation 10/03/16   PT Start Time 3474   PT Stop Time 1116   PT Time Calculation (min) 44 min   Activity Tolerance Patient tolerated treatment well   Behavior During Therapy [facility] for tasks assessed/performed      Past Medical History:  Diagnosis Date  . Hyperlipidemia   . Hypertension   . Metabolic syndrome     No past surgical history on file.  Vitals:   08/29/16 1050  Pulse: 95  SpO2: 97%        Subjective Assessment - 08/29/16 1034    Subjective Reports that tingling is still present. Reports that vibration makes tingling worse such as when driving or mowing his yard. Reports that any pressure makes the tingling better. Reports improvement with balance compared to directly following stroke.   Patient is accompained by: Family member  Wife, David Garza   Pertinent History Right shoulder reconstruction.   How long can you stand comfortably? 5 minutes.   Patient Stated Goals Want to ger back to normal and do what I was doing before.   Currently in Pain? Yes   Pain Score 9    Pain Location Generalized   Pain Orientation Right   Pain Descriptors / Indicators Tingling   Pain Type Acute pain   Pain Onset 1 to 4 weeks ago   Pain Frequency Constant            [facility] PT Assessment - 08/29/16 0001      Assessment   Medical Diagnosis New stroke.   Onset Date/Surgical Date 07/28/16   Hand Dominance Right   Next MD Visit 11/2016     Precautions   Precautions Fall     Restrictions   Weight Bearing Restrictions No                     [facility]  Adult PT Treatment/Exercise - 08/29/16 0001      Knee/Hip Exercises: Aerobic   Nustep Level 5 x 16 minutes.     Knee/Hip Exercises: Machines for Strengthening   Cybex Knee Extension 20# 3x10 reps    Cybex Knee Flexion 40# 3x10 reps     Shoulder Exercises: ROM/Strengthening   Wall Pushups 20 reps     Hand Exercises   Desensitization Rough sytrofoam, terry cloth towel, pillowcase x6 min   Rubberbands x30 reps    Other Hand Exercises 1.4 Digiflex with each finger x20 reps                     PT Long Term Goals - 08/22/16 0931      PT LONG TERM GOAL #1   Title Independent with a HEP.   Time 8   Period Weeks   Status On-going     PT LONG TERM GOAL #2   Title 5/5 right U and LE strength.   Time 8   Period Weeks   Status On-going     PT LONG TERM GOAL #3   Title Right grip= 70#.   Time 8   Period Weeks  Status On-going  60# 08/22/16     PT LONG TERM GOAL #4   Title Patient walk unassisted without deviation.   Time 8   Period Weeks   Status On-going               Plan - 08/29/16 1144    Clinical Impression Statement Patient arrived to treatment with continued reports of R side tingling that is intense and uncomfortable. Patient did well with machine strengthening today following a rest due to injection last week. Weightbearing wall pushups completed as weightbearing helps alleviate tingling for a time period. Desensitization techniques utilized during today's treatment to assist with reduction of R side tingling. Patient and wife encouraged to attempt desensitization at home with carpet or towel for foot and RUE and with pillowcase as well. Patient also encouraged to monitor tingling as to better assess the progress or regression of tingling.   Rehab Potential Excellent   PT Frequency 2x / week   PT Duration 8 weeks   PT Treatment/Interventions ADLs/Self Care Home Management;Functional mobility training;Stair training;Gait training;Therapeutic  activities;Therapeutic exercise;Balance training;Neuromuscular re-education;Patient/family education   PT Next Visit Plan Continue with RUE and RLE strengthening and balance exercises as well as desensitization techniques per MPT POC.   Consulted and Agree with Plan of Care Patient      Patient will benefit from skilled therapeutic intervention in order to improve the following deficits and impairments:  Decreased activity tolerance, Decreased balance, Decreased mobility, Abnormal gait  Visit Diagnosis: Unsteadiness on feet  Muscle weakness (generalized)     Problem List Patient Active Problem List   Diagnosis Date Noted  . Gastroesophageal reflux disease without esophagitis 03/06/2015  . BMI 33.0-33.9,adult 03/06/2015  . Hypertension 09/17/2012  . Hyperlipidemia 09/17/2012  . Gout 09/17/2012    Wynelle Fanny, PTA 08/29/2016, 11:49 AM  [facility] [address] [place], [place], [address] Phone: [phone]   Fax:  [phone]  Name: David Garza MRN: [id number] Date of Birth: 1951-02-23

## 2016-08-31 ENCOUNTER — Encounter: Payer: Self-pay | Admitting: Physical Therapy

## 2016-08-31 ENCOUNTER — Ambulatory Visit: Payer: BLUE CROSS/BLUE SHIELD | Attending: Internal Medicine | Admitting: Physical Therapy

## 2016-08-31 DIAGNOSIS — M6281 Muscle weakness (generalized): Secondary | ICD-10-CM | POA: Insufficient documentation

## 2016-08-31 DIAGNOSIS — R2681 Unsteadiness on feet: Secondary | ICD-10-CM | POA: Diagnosis present

## 2016-08-31 NOTE — Therapy (Signed)
[facility] [place], [place], [address] Phone: [phone]   Fax:  [phone]  Physical Therapy Treatment  Patient Details  Name: Callin Ashe MRN: [id number] Date of Birth: July 22, 1950 Referring Provider: Kerri Perches MD  Encounter Date: 08/31/2016      PT End of Session - 08/31/16 0930    Visit Number 8   Number of Visits 16   Date for PT Re-Evaluation 10/03/16   PT Start Time 0901   PT Stop Time 0944   PT Time Calculation (min) 43 min   Activity Tolerance Patient tolerated treatment well   Behavior During Therapy [facility] for tasks assessed/performed      Past Medical History:  Diagnosis Date  . Hyperlipidemia   . Hypertension   . Metabolic syndrome     History reviewed. No pertinent surgical history.  There were no vitals filed for this visit.      Subjective Assessment - 08/31/16 0904    Subjective Patient reported ongoing tingling in hands   Patient is accompained by: Family member   Pertinent History Right shoulder reconstruction.   How long can you stand comfortably? 5 minutes.   Patient Stated Goals Want to ger back to normal and do what I was doing before.   Currently in Pain? No/denies   Pain Location Generalized   Pain Orientation Right   Pain Descriptors / Indicators Tingling   Pain Type Acute pain   Pain Onset 1 to 4 weeks ago   Pain Frequency Constant            [facility] PT Assessment - 08/31/16 0001      Strength   Strength Assessment Site Shoulder;Hand;Knee;Hip   Right/Left Shoulder Right   Right Shoulder Flexion 4+/5   Right Shoulder ABduction 4/5   Right Shoulder Internal Rotation 5/5   Right Shoulder External Rotation 4+/5   Right Hand Grip (lbs) 80   Right/Left Hip Right   Right Hip ABduction 5/5   Right/Left Knee Right   Right Knee Flexion 4+/5   Right Knee Extension 5/5                     [facility] Adult PT Treatment/Exercise - 08/31/16 0001      Knee/Hip Exercises: Aerobic   Nustep Level 5 x 15 minutes UE/LE     Knee/Hip Exercises: Machines for Strengthening   Cybex Knee Extension 20# 3x10 reps    Cybex Knee Flexion 40# 3x10 reps     Knee/Hip Exercises: Standing   Lateral Step Up Right;2 sets;10 reps;Hand Hold: 0;Step Height: 8"   Forward Step Up Right;2 sets;10 reps;Hand Hold: 0;Step Height: 8"     Knee/Hip Exercises: Supine   Bridges with Clamshell Strengthening;Both;2 sets;10 reps  with green t-band     Shoulder Exercises: Standing   Protraction Strengthening;Theraband;Right  x30   Theraband Level (Shoulder Protraction) Level 2 (Red)   External Rotation Strengthening;Right;Theraband  x30   Theraband Level (Shoulder External Rotation) Level 2 (Red)   Internal Rotation Strengthening;Right;Theraband  x30   Theraband Level (Shoulder Internal Rotation) Level 2 (Red)   Row Strengthening;Theraband;Right  x30   Theraband Level (Shoulder Row) Level 2 (Red)     Hand Exercises   Other Hand Exercises weight shifting on right UE 2x10                     PT Long Term Goals - 08/31/16 0931      PT LONG TERM  GOAL #1   Title Independent with a HEP.   Time 8   Period Weeks   Status On-going     PT LONG TERM GOAL #2   Title 5/5 right U and LE strength.   Time 8   Period Weeks   Status On-going     PT LONG TERM GOAL #3   Title Right grip= 70#.   Time 8   Period Weeks   Status Achieved  80# 08/31/16     PT LONG TERM GOAL #4   Title Patient walk unassisted without deviation.   Time 8   Period Weeks   Status On-going               Plan - 08/31/16 0944    Clinical Impression Statement Patient tolerated treatment well today. Patient has improved grip strength and UE/LE strength on right side. Patient has ongoing tingling in right hand. Todaymet STG #3 others ongoing due to strength deficts.    Rehab Potential Excellent   PT Frequency 2x / week   PT Duration 8 weeks   PT Treatment/Interventions  ADLs/Self Care Home Management;Functional mobility training;Stair training;Gait training;Therapeutic activities;Therapeutic exercise;Balance training;Neuromuscular re-education;Patient/family education   PT Next Visit Plan Continue with RUE and RLE strengthening and balance exercises as well as desensitization techniques per MPT POC.   Consulted and Agree with Plan of Care Patient      Patient will benefit from skilled therapeutic intervention in order to improve the following deficits and impairments:  Decreased activity tolerance, Decreased balance, Decreased mobility, Abnormal gait  Visit Diagnosis: Unsteadiness on feet  Muscle weakness (generalized)     Problem List Patient Active Problem List   Diagnosis Date Noted  . Gastroesophageal reflux disease without esophagitis 03/06/2015  . BMI 33.0-33.9,adult 03/06/2015  . Hypertension 09/17/2012  . Hyperlipidemia 09/17/2012  . Gout 09/17/2012    Fue Cervenka P, PTA 08/31/2016, 9:49 AM  [facility] [place], [place], [address] Phone: [phone]   Fax:  [phone]  Name: Yuvan Medinger MRN: [id number] Date of Birth: [birth year]

## 2016-09-05 ENCOUNTER — Ambulatory Visit: Payer: BLUE CROSS/BLUE SHIELD | Admitting: Physical Therapy

## 2016-09-05 ENCOUNTER — Encounter: Payer: Self-pay | Admitting: Physical Therapy

## 2016-09-05 DIAGNOSIS — M6281 Muscle weakness (generalized): Secondary | ICD-10-CM

## 2016-09-05 DIAGNOSIS — R2681 Unsteadiness on feet: Secondary | ICD-10-CM

## 2016-09-05 NOTE — Therapy (Signed)
[facility] [place], [place], [address] Phone: [phone]   Fax:  [phone]  Physical Therapy Treatment  Patient Details  Name: David Garza MRN: [id number] Date of Birth: Sep 22, 1950 Referring Provider: Kerri Perches MD  Encounter Date: 09/05/2016      PT End of Session - 09/05/16 1110    Visit Number 9   Number of Visits 16   Date for PT Re-Evaluation 10/03/16   PT Start Time 1031   PT Stop Time 1114   PT Time Calculation (min) 43 min   Activity Tolerance Patient tolerated treatment well   Behavior During Therapy [facility] for tasks assessed/performed      Past Medical History:  Diagnosis Date  . Hyperlipidemia   . Hypertension   . Metabolic syndrome     History reviewed. No pertinent surgical history.  There were no vitals filed for this visit.      Subjective Assessment - 09/05/16 1037    Subjective Patient reported increased tingling in hand for unknown reason and will be contacting MD   Patient is accompained by: Family member   Pertinent History Right shoulder reconstruction.   How long can you stand comfortably? 5 minutes.   Patient Stated Goals Want to ger back to normal and do what I was doing before.   Currently in Pain? No/denies                         [facility] Adult PT Treatment/Exercise - 09/05/16 0001      Elbow Exercises   Elbow Flexion Strengthening;Left;Seated;Bar weights/barbell  x30   Bar Weights/Barbell (Elbow Flexion) 4 lbs     Knee/Hip Exercises: Aerobic   Nustep Level 5 x 15 minutes UE/LE     Knee/Hip Exercises: Machines for Strengthening   Cybex Knee Extension 20# 3x10 reps    Cybex Knee Flexion 40# 3x10 reps     Knee/Hip Exercises: Standing   Knee Flexion Strengthening;Right  x30 no weight     Shoulder Exercises: Sidelying   External Rotation Strengthening;Right  2# x10, 3# x10     Shoulder Exercises: Standing   Protraction  Strengthening;Theraband;Right  x30   Theraband Level (Shoulder Protraction) Level 2 (Red)   External Rotation Strengthening;Right;Theraband  x30   Theraband Level (Shoulder External Rotation) Level 2 (Red)   Internal Rotation Strengthening;Right;Theraband  x30   Theraband Level (Shoulder Internal Rotation) Level 2 (Red)   Row Strengthening;Theraband;Right  x30   Theraband Level (Shoulder Row) Level 2 (Red)   Other Standing Exercises overhead press 4# 3x10                     PT Long Term Goals - 08/31/16 0931      PT LONG TERM GOAL #1   Title Independent with a HEP.   Time 8   Period Weeks   Status On-going     PT LONG TERM GOAL #2   Title 5/5 right U and LE strength.   Time 8   Period Weeks   Status On-going     PT LONG TERM GOAL #3   Title Right grip= 70#.   Time 8   Period Weeks   Status Achieved  80# 08/31/16     PT LONG TERM GOAL #4   Title Patient walk unassisted without deviation.   Time 8   Period Weeks   Status On-going  Plan - 09/05/16 1111    Clinical Impression Statement Patient tolerated treatment well today. Patient progressing with right UE/LE strengthening exercises. Patient has some ongoing weakness for right shoulder flexion and ER and right knee flexion. Patient has complaints of right hand and right LE numbness, tingling and unable to sense cold/hot. Patient is to call MD to follow up with symptoms and ask about occupational therapy or medication. Patient current goals ongoing due to strength deficits.    Rehab Potential Excellent   PT Frequency 2x / week   PT Duration 8 weeks   PT Treatment/Interventions ADLs/Self Care Home Management;Functional mobility training;Stair training;Gait training;Therapeutic activities;Therapeutic exercise;Balance training;Neuromuscular re-education;Patient/family education   PT Next Visit Plan Continue with RUE and RLE strengthening and balance exercises as well as desensitization  techniques per MPT POC.   Consulted and Agree with Plan of Care Patient      Patient will benefit from skilled therapeutic intervention in order to improve the following deficits and impairments:  Decreased activity tolerance, Decreased balance, Decreased mobility, Abnormal gait  Visit Diagnosis: Unsteadiness on feet  Muscle weakness (generalized)     Problem List Patient Active Problem List   Diagnosis Date Noted  . Gastroesophageal reflux disease without esophagitis 03/06/2015  . BMI 33.0-33.9,adult 03/06/2015  . Hypertension 09/17/2012  . Hyperlipidemia 09/17/2012  . Gout 09/17/2012    Jonnatan Hanners P, PTA 09/05/2016, 11:19 AM  [facility] [place], [place], [address] Phone: [phone]   Fax:  [phone]  Name: David Garza MRN: [id number] Date of Birth: 1950-05-20

## 2016-09-07 ENCOUNTER — Ambulatory Visit: Payer: BLUE CROSS/BLUE SHIELD | Admitting: Physical Therapy

## 2016-09-07 ENCOUNTER — Telehealth: Payer: Self-pay | Admitting: Nurse Practitioner

## 2016-09-07 ENCOUNTER — Encounter: Payer: Self-pay | Admitting: Physical Therapy

## 2016-09-07 DIAGNOSIS — R2681 Unsteadiness on feet: Secondary | ICD-10-CM

## 2016-09-07 DIAGNOSIS — M6281 Muscle weakness (generalized): Secondary | ICD-10-CM

## 2016-09-07 NOTE — Therapy (Signed)
[facility] [place], [place], [address] Phone: [phone]   Fax:  [phone]  Physical Therapy Treatment  Patient Details  Name: David Garza MRN: [id number] Date of Birth: 02-16-1951 Referring Provider: Kerri Perches MD  Encounter Date: 09/07/2016      PT End of Session - 09/07/16 0935    Visit Number 10   Number of Visits 16   Date for PT Re-Evaluation 10/03/16   PT Start Time 0900   PT Stop Time 0940   PT Time Calculation (min) 40 min   Activity Tolerance Patient tolerated treatment well   Behavior During Therapy [facility] for tasks assessed/performed      Past Medical History:  Diagnosis Date  . Hyperlipidemia   . Hypertension   . Metabolic syndrome     History reviewed. No pertinent surgical history.  There were no vitals filed for this visit.      Subjective Assessment - 09/07/16 0906    Subjective Patient reported ongoing tingling in hand but has strength back   Patient is accompained by: Family member   Pertinent History Right shoulder reconstruction.   How long can you stand comfortably? 5 minutes.   Patient Stated Goals Want to ger back to normal and do what I was doing before.   Currently in Pain? No/denies                         [facility] Adult PT Treatment/Exercise - 09/07/16 0001      Elbow Exercises   Elbow Flexion Strengthening;Left;Seated;Bar weights/barbell  x30   Bar Weights/Barbell (Elbow Flexion) 4 lbs     Knee/Hip Exercises: Aerobic   Elliptical L5R1 x56min   Nustep Level 5 x 15 minutes UE/LE     Knee/Hip Exercises: Machines for Strengthening   Cybex Knee Extension 20# 3x10 reps    Cybex Knee Flexion 40# 3x10 reps     Shoulder Exercises: Standing   Protraction Strengthening;Theraband;Right  x30   Theraband Level (Shoulder Protraction) Level 2 (Red)   External Rotation Strengthening;Right;Theraband  x30   Theraband Level (Shoulder External Rotation) Level 2  (Red)   Internal Rotation Strengthening;Right;Theraband  x30   Theraband Level (Shoulder Internal Rotation) Level 2 (Red)   Row Strengthening;Theraband;Right  x30   Theraband Level (Shoulder Row) Level 2 (Red)   Other Standing Exercises overhead press 4# 3x10                     PT Long Term Goals - 08/31/16 0931      PT LONG TERM GOAL #1   Title Independent with a HEP.   Time 8   Period Weeks   Status On-going     PT LONG TERM GOAL #2   Title 5/5 right U and LE strength.   Time 8   Period Weeks   Status On-going     PT LONG TERM GOAL #3   Title Right grip= 70#.   Time 8   Period Weeks   Status Achieved  80# 08/31/16     PT LONG TERM GOAL #4   Title Patient walk unassisted without deviation.   Time 8   Period Weeks   Status On-going               Plan - 09/07/16 0935    Clinical Impression Statement Patient tolerated treatment well and progresing with right UE and LE strengthening. Patient able to drive and perform yard work  with greater ease. Patient has some slight ongoing weakness withright shoulder flexion and ER and right knee flexion. Goals ongoing due to strength deficit.   Rehab Potential Excellent   PT Frequency 2x / week   PT Duration 8 weeks   PT Treatment/Interventions ADLs/Self Care Home Management;Functional mobility training;Stair training;Gait training;Therapeutic activities;Therapeutic exercise;Balance training;Neuromuscular re-education;Patient/family education   PT Next Visit Plan Continue with RUE and RLE strengthening and balance exercises as well as desensitization techniques per MPT POC.   Consulted and Agree with Plan of Care Patient      Patient will benefit from skilled therapeutic intervention in order to improve the following deficits and impairments:  Decreased activity tolerance, Decreased balance, Decreased mobility, Abnormal gait  Visit Diagnosis: Unsteadiness on feet  Muscle weakness  (generalized)     Problem List Patient Active Problem List   Diagnosis Date Noted  . Gastroesophageal reflux disease without esophagitis 03/06/2015  . BMI 33.0-33.9,adult 03/06/2015  . Hypertension 09/17/2012  . Hyperlipidemia 09/17/2012  . Gout 09/17/2012    Crystale Giannattasio P, PTA 09/07/2016, 9:41 AM  [facility] [place], [place], [address] Phone: [phone]   Fax:  [phone]  Name: David Garza MRN: [id number] Date of Birth: 1951-01-23

## 2016-09-07 NOTE — Telephone Encounter (Signed)
Pt is having some neuropathy in feet and hands Wants RX Please advise

## 2016-09-08 MED ORDER — GABAPENTIN 100 MG PO CAPS: 100.0000 mg | ORAL_CAPSULE | Freq: Three times a day (TID) | ORAL | 3 refills | Status: DC

## 2016-09-08 NOTE — Telephone Encounter (Signed)
Gabapentin rx sent to pharmacy.

## 2016-09-08 NOTE — Telephone Encounter (Signed)
Wife aware per dpr.  

## 2016-09-12 ENCOUNTER — Ambulatory Visit: Payer: BLUE CROSS/BLUE SHIELD | Admitting: Physical Therapy

## 2016-09-12 ENCOUNTER — Encounter: Payer: Self-pay | Admitting: Physical Therapy

## 2016-09-12 DIAGNOSIS — M6281 Muscle weakness (generalized): Secondary | ICD-10-CM

## 2016-09-12 DIAGNOSIS — R2681 Unsteadiness on feet: Secondary | ICD-10-CM | POA: Diagnosis not present

## 2016-09-12 NOTE — Patient Instructions (Addendum)
  Strengthening: Resisted Flexion   Hold tubing with left arm at side. Pull forward and up. Move shoulder through pain-free range of motion. Repeat __10__ times per set. Do _2-3___ sets per session. Do _2-3___ sessions per day. http://orth.exer.us/824   Copyright  VHI. All rights reserved.  Strengthening: Resisted Extension   Hold tubing in right hand, arm forward. Pull arm back, elbow straight. Repeat __10__ times per set. Do _2-3___ sets per session. Do _2-3___ sessions per day.   Strengthening: Resisted Internal Rotation   Hold tubing in left hand, elbow at side and forearm out. Rotate forearm in across body. Repeat __10__ times per set. Do _2-3___ sets per session. Do _2-3___ sessions per day.   Strengthening: Resisted External Rotation   Hold tubing in right hand, elbow at side and forearm across body. Rotate forearm out. Repeat __10__ times per set. Do __2-3__ sets per session. Do ____ sessions per day.    Strengthening: Knee Flexion (Standing)    With support, bend right knee as far as possible. Repeat _10___ times per set. Do __2-3__ sets per session. Do __2__ sessions per day.

## 2016-09-12 NOTE — Therapy (Signed)
[facility] [place], [place], [address] Phone: [phone]   Fax:  [phone]  Physical Therapy Treatment  Patient Details  Name: David Garza MRN: [id number] Date of Birth: [birth year] Referring Provider: Kerri Perches MD  Encounter Date: 09/12/2016      PT End of Session - 09/12/16 0922    Visit Number 11   Number of Visits 16   Date for PT Re-Evaluation 10/03/16   PT Start Time 0859   PT Stop Time 0942   PT Time Calculation (min) 43 min   Activity Tolerance Patient tolerated treatment well   Behavior During Therapy [facility] for tasks assessed/performed      Past Medical History:  Diagnosis Date  . Hyperlipidemia   . Hypertension   . Metabolic syndrome     History reviewed. No pertinent surgical history.  There were no vitals filed for this visit.      Subjective Assessment - 09/12/16 0906    Subjective Patient went to MD re: tingling in hand and was given medication   Patient is accompained by: Family member   Pertinent History Right shoulder reconstruction.   How long can you stand comfortably? 5 minutes.   Patient Stated Goals Want to ger back to normal and do what I was doing before.   Currently in Pain? No/denies                         [facility] Adult PT Treatment/Exercise - 09/12/16 0001      Elbow Exercises   Elbow Flexion Strengthening;Left;Seated;Bar weights/barbell  x30   Bar Weights/Barbell (Elbow Flexion) 5 lbs     Knee/Hip Exercises: Aerobic   Elliptical L5R1 x23mn   Nustep Level 7 x 15 minutes UE/LE     Knee/Hip Exercises: Machines for Strengthening   Cybex Knee Extension 20# 3x10 reps    Cybex Knee Flexion 40# 3x10 reps     Knee/Hip Exercises: Standing   Knee Flexion Strengthening;Right;20 reps     Shoulder Exercises: Standing   Protraction Strengthening;Theraband;Right  x30   Theraband Level (Shoulder Protraction) Level 2 (Red)   External Rotation  Strengthening;Right;Theraband  x30   Theraband Level (Shoulder External Rotation) Level 2 (Red)   Internal Rotation Strengthening;Right;Theraband  x30   Theraband Level (Shoulder Internal Rotation) Level 2 (Red)   Row Strengthening;Theraband;Right  x30   Theraband Level (Shoulder Row) Level 2 (Red)   Other Standing Exercises overhead press 5# 3x10                PT Education - 09/12/16 0922    Education provided Yes   Education Details HEP   Person(s) Educated Patient;Spouse   Methods Explanation;Demonstration;Handout   Comprehension Verbalized understanding;Returned demonstration             PT Long Term Goals - 09/12/16 0930      PT LONG TERM GOAL #1   Title Independent with a HEP.   Time 8   Period Weeks   Status Achieved  09/12/16     PT LONG TERM GOAL #2   Title 5/5 right U and LE strength.   Time 8   Period Weeks   Status On-going     PT LONG TERM GOAL #3   Title Right grip= 70#.   Time 8   Period Weeks   Status Achieved     PT LONG TERM GOAL #4   Title Patient walk unassisted without deviation.   Time 8  Period Weeks   Status Achieved  09/12/16               Plan - 09/12/16 0935    Clinical Impression Statement Patient tolerated treatment well today. Patient continues to improve overall and reported being able to perform ADL's with greater ease. Patient has limitations due to tingling in right UE and lack of feeling in lower right and MD gave him medication and then follow up appt in a few months. Patient was given HEP for strengthening UE and LE today. Patient may want to DC to HEP/gym program. Patient able to walk independently with no gait deviation. Patient met LTG #1 and #4 today.   Rehab Potential Excellent   PT Frequency 2x / week   PT Duration 8 weeks   PT Treatment/Interventions ADLs/Self Care Home Management;Functional mobility training;Stair training;Gait training;Therapeutic activities;Therapeutic exercise;Balance  training;Neuromuscular re-education;Patient/family education   PT Next Visit Plan Continue with RUE and RLE strengthening and balance exercises may DC per patient request   Consulted and Agree with Plan of Care Patient      Patient will benefit from skilled therapeutic intervention in order to improve the following deficits and impairments:  Decreased activity tolerance, Decreased balance, Decreased mobility, Abnormal gait  Visit Diagnosis: Unsteadiness on feet  Muscle weakness (generalized)     Problem List Patient Active Problem List   Diagnosis Date Noted  . Gastroesophageal reflux disease without esophagitis 03/06/2015  . BMI 33.0-33.9,adult 03/06/2015  . Hypertension 09/17/2012  . Hyperlipidemia 09/17/2012  . Gout 09/17/2012    David Garza P, PTA 09/12/2016, 9:45 AM  [facility] [place], [place], [address] Phone: [phone]   Fax:  [phone]  Name: David Garza MRN: [id number] Date of Birth: Jul 31, 1950

## 2016-09-14 ENCOUNTER — Encounter: Payer: Self-pay | Admitting: Physical Therapy

## 2016-09-14 ENCOUNTER — Ambulatory Visit: Payer: BLUE CROSS/BLUE SHIELD | Admitting: Physical Therapy

## 2016-09-14 DIAGNOSIS — R2681 Unsteadiness on feet: Secondary | ICD-10-CM | POA: Diagnosis not present

## 2016-09-14 DIAGNOSIS — M6281 Muscle weakness (generalized): Secondary | ICD-10-CM

## 2016-09-14 NOTE — Therapy (Signed)
[facility] [place], [place], [address] Phone: [phone]   Fax:  [phone]  Physical Therapy Treatment  Patient Details  Name: David Garza MRN: [id number] Date of Birth: Nov 18, 1950 Referring Provider: Kerri Perches MD  Encounter Date: 09/14/2016      PT End of Session - 09/14/16 0932    Visit Number 12   Number of Visits 16   Date for PT Re-Evaluation 10/03/16   PT Start Time 0900   PT Stop Time 0943   PT Time Calculation (min) 43 min   Activity Tolerance Patient tolerated treatment well   Behavior During Therapy [facility] for tasks assessed/performed      Past Medical History:  Diagnosis Date  . Hyperlipidemia   . Hypertension   . Metabolic syndrome     History reviewed. No pertinent surgical history.  There were no vitals filed for this visit.      Subjective Assessment - 09/14/16 0906    Subjective Patient doing well overall and would like to be put on hold   Pertinent History Right shoulder reconstruction.   How long can you stand comfortably? 5 minutes.   Patient Stated Goals Want to ger back to normal and do what I was doing before.   Currently in Pain? No/denies                         [facility] Adult PT Treatment/Exercise - 09/14/16 0001      Elbow Exercises   Elbow Flexion Strengthening;Left;Seated;Bar weights/barbell  x30   Bar Weights/Barbell (Elbow Flexion) 5 lbs     Knee/Hip Exercises: Aerobic   Elliptical L5R1 x60mn   Nustep Level 7 x 15 minutes UE/LE     Knee/Hip Exercises: Machines for Strengthening   Cybex Knee Extension 20# 3x10 reps    Cybex Knee Flexion 40# 3x10 reps     Shoulder Exercises: Standing   Protraction Strengthening;Theraband;Right  x30   Theraband Level (Shoulder Protraction) Level 2 (Red)   External Rotation Strengthening;Right;Theraband  x30   Theraband Level (Shoulder External Rotation) Level 2 (Red)   Internal Rotation  Strengthening;Right;Theraband  x30   Theraband Level (Shoulder Internal Rotation) Level 2 (Red)   Row Strengthening;Theraband;Right  x30   Theraband Level (Shoulder Row) Level 2 (Red)   Other Standing Exercises overhead press 5# 3x10   Other Standing Exercises 20# flexion/abd/ER eccentic deloading x20 each way                     PT Long Term Goals - 09/12/16 0930      PT LONG TERM GOAL #1   Title Independent with a HEP.   Time 8   Period Weeks   Status Achieved  09/12/16     PT LONG TERM GOAL #2   Title 5/5 right U and LE strength.   Time 8   Period Weeks   Status On-going     PT LONG TERM GOAL #3   Title Right grip= 70#.   Time 8   Period Weeks   Status Achieved     PT LONG TERM GOAL #4   Title Patient walk unassisted without deviation.   Time 8   Period Weeks   Status Achieved  09/12/16               Plan - 09/14/16 0934    Clinical Impression Statement Patient tolerated treatment well today. Patient has minimal strength deficts with right HS  and right UE flexion and ER. Patient is independent with HEP and would like to consider being put on hold. Patient met all but stretngth goal.   Rehab Potential Excellent   PT Frequency 2x / week   PT Duration 8 weeks   PT Treatment/Interventions ADLs/Self Care Home Management;Functional mobility training;Stair training;Gait training;Therapeutic activities;Therapeutic exercise;Balance training;Neuromuscular re-education;Patient/family education   PT Next Visit Plan Continue with RUE and RLE strengthening and balance exercises or be on hold per patient request   Consulted and Agree with Plan of Care Patient      Patient will benefit from skilled therapeutic intervention in order to improve the following deficits and impairments:  Decreased activity tolerance, Decreased balance, Decreased mobility, Abnormal gait  Visit Diagnosis: Unsteadiness on feet  Muscle weakness (generalized)     Problem  List Patient Active Problem List   Diagnosis Date Noted  . Gastroesophageal reflux disease without esophagitis 03/06/2015  . BMI 33.0-33.9,adult 03/06/2015  . Hypertension 09/17/2012  . Hyperlipidemia 09/17/2012  . Gout 09/17/2012    DUNFORD, CHRISTINA P, PTA 09/14/2016, 9:45 AM  [facility] [place], [place], [address] Phone: [phone]   Fax:  [phone]  Name: David Garza MRN: [id number] Date of Birth: 05/02/1951

## 2016-09-19 ENCOUNTER — Ambulatory Visit: Payer: BLUE CROSS/BLUE SHIELD | Admitting: Physical Therapy

## 2016-09-19 ENCOUNTER — Encounter: Payer: Self-pay | Admitting: Physical Therapy

## 2016-09-19 DIAGNOSIS — R2681 Unsteadiness on feet: Secondary | ICD-10-CM

## 2016-09-19 DIAGNOSIS — M6281 Muscle weakness (generalized): Secondary | ICD-10-CM

## 2016-09-19 NOTE — Therapy (Signed)
[facility] [place], [place], [address] Phone: [phone]   Fax:  [phone]  Physical Therapy Treatment  Patient Details  Name: David Garza MRN: [id number] Date of Birth: Feb 03, 1951 Referring Provider: Kerri Perches MD  Encounter Date: 09/19/2016      PT End of Session - 09/19/16 0928    Visit Number 13   Number of Visits 16   Date for PT Re-Evaluation 10/03/16   PT Start Time 0901   PT Stop Time 0943   PT Time Calculation (min) 42 min   Activity Tolerance Patient tolerated treatment well   Behavior During Therapy [facility] for tasks assessed/performed      Past Medical History:  Diagnosis Date  . Hyperlipidemia   . Hypertension   . Metabolic syndrome     History reviewed. No pertinent surgical history.  There were no vitals filed for this visit.      Subjective Assessment - 09/19/16 0904    Subjective Patient would like to continue therapy for strengthening a few more visits.   Patient is accompained by: Family member   Pertinent History Right shoulder reconstruction.   How long can you stand comfortably? 5 minutes.   Patient Stated Goals Want to ger back to normal and do what I was doing before.   Currently in Pain? No/denies                         [facility] Adult PT Treatment/Exercise - 09/19/16 0001      Knee/Hip Exercises: Aerobic   Elliptical L5R1 x63min   Nustep Level 7 x 15 minutes UE/LE     Knee/Hip Exercises: Machines for Strengthening   Cybex Knee Extension 20# 3x12 reps    Cybex Knee Flexion 40# 3x12 reps     Shoulder Exercises: Standing   Protraction Strengthening;Theraband;Right  x30   Theraband Level (Shoulder Protraction) Level 2 (Red)   External Rotation Strengthening;Right;Theraband  x30   Theraband Level (Shoulder External Rotation) Level 2 (Red)   Internal Rotation Strengthening;Right;Theraband  x30   Theraband Level (Shoulder Internal Rotation) Level 2  (Red)   Extension Strengthening;Right;Theraband  x30   Theraband Level (Shoulder Extension) Level 2 (Red)   Row Strengthening;Both;Theraband  x30   Theraband Level (Shoulder Row) Other (comment)  pink XTS   Other Standing Exercises 20# flexion/abd/ER eccentic deloading x30 each way                     PT Long Term Goals - 09/12/16 0930      PT LONG TERM GOAL #1   Title Independent with a HEP.   Time 8   Period Weeks   Status Achieved  09/12/16     PT LONG TERM GOAL #2   Title 5/5 right U and LE strength.   Time 8   Period Weeks   Status On-going     PT LONG TERM GOAL #3   Title Right grip= 70#.   Time 8   Period Weeks   Status Achieved     PT LONG TERM GOAL #4   Title Patient walk unassisted without deviation.   Time 8   Period Weeks   Status Achieved  09/12/16               Plan - 09/19/16 6314    Clinical Impression Statement Patient tolerated treatment well and progressing with strengthening exercises. Patient continues to report being able to perform his daily  tasks with greater ease. Patient has onging tingling sensation in right hand and lack of sensation in right foot yet able to drive with greater ease. Patient close to meeting strength goal. Minimal strength defict in right UE/LE.    Rehab Potential Excellent   PT Frequency 2x / week   PT Duration 8 weeks   PT Treatment/Interventions ADLs/Self Care Home Management;Functional mobility training;Stair training;Gait training;Therapeutic activities;Therapeutic exercise;Balance training;Neuromuscular re-education;Patient/family education   PT Next Visit Plan Continue with RUE and RLE strengthening and balance exercises or be on hold per patient request   Consulted and Agree with Plan of Care Patient      Patient will benefit from skilled therapeutic intervention in order to improve the following deficits and impairments:  Decreased activity tolerance, Decreased balance, Decreased mobility,  Abnormal gait  Visit Diagnosis: Unsteadiness on feet  Muscle weakness (generalized)     Problem List Patient Active Problem List   Diagnosis Date Noted  . Gastroesophageal reflux disease without esophagitis 03/06/2015  . BMI 33.0-33.9,adult 03/06/2015  . Hypertension 09/17/2012  . Hyperlipidemia 09/17/2012  . Gout 09/17/2012    Phillips Climes, PTA 09/19/2016, 9:47 AM  [facility] [place], [place], [address] Phone: [phone]   Fax:  [phone]  Name: David Garza MRN: [id number] Date of Birth: [birth year]

## 2016-09-21 ENCOUNTER — Encounter: Payer: Self-pay | Admitting: Physical Therapy

## 2016-09-21 ENCOUNTER — Ambulatory Visit: Payer: BLUE CROSS/BLUE SHIELD | Admitting: Physical Therapy

## 2016-09-21 DIAGNOSIS — R2681 Unsteadiness on feet: Secondary | ICD-10-CM

## 2016-09-21 DIAGNOSIS — M6281 Muscle weakness (generalized): Secondary | ICD-10-CM

## 2016-09-21 NOTE — Therapy (Signed)
[facility] [place], [place], [address] Phone: [phone]   Fax:  [phone]  Physical Therapy Treatment  Patient Details  Name: David Garza MRN: [id number] Date of Birth: 1950-11-02 Referring Provider: Kerri Perches MD  Encounter Date: 09/21/2016      PT End of Session - 09/21/16 0929    Visit Number 14   Number of Visits 16   Date for PT Re-Evaluation 10/03/16   PT Start Time 0901   PT Stop Time 0944   PT Time Calculation (min) 43 min   Activity Tolerance Patient tolerated treatment well   Behavior During Therapy [facility] for tasks assessed/performed      Past Medical History:  Diagnosis Date  . Hyperlipidemia   . Hypertension   . Metabolic syndrome     History reviewed. No pertinent surgical history.  There were no vitals filed for this visit.      Subjective Assessment - 09/21/16 0920    Subjective Patient reported doing well overall   Patient is accompained by: Family member   Pertinent History Right shoulder reconstruction.   How long can you stand comfortably? 5 minutes.   Patient Stated Goals Want to ger back to normal and do what I was doing before.   Currently in Pain? No/denies                         [facility] Adult PT Treatment/Exercise - 09/21/16 0001      Knee/Hip Exercises: Aerobic   Elliptical L5R1 x35min   Nustep Level 7 x 16 minutes UE/LE     Knee/Hip Exercises: Machines for Strengthening   Cybex Knee Extension 20# 3x15 reps    Cybex Knee Flexion 40# 3x15 reps     Shoulder Exercises: Standing   Protraction Strengthening;Theraband;Right  x30   Theraband Level (Shoulder Protraction) Level 2 (Red)   External Rotation Strengthening;Right;Theraband  x30   Theraband Level (Shoulder External Rotation) Level 2 (Red)   Internal Rotation Strengthening;Right;Theraband  x30   Theraband Level (Shoulder Internal Rotation) Level 2 (Red)   Extension  Strengthening;Right;Theraband  x30   Theraband Level (Shoulder Extension) Level 2 (Red)   Row Strengthening;Both;Theraband   Theraband Level (Shoulder Row) --   Row Weight (lbs) 40   Other Standing Exercises 20# flexion/abd/ER eccentic deloading x30 each way                     PT Long Term Goals - 09/12/16 0930      PT LONG TERM GOAL #1   Title Independent with a HEP.   Time 8   Period Weeks   Status Achieved  09/12/16     PT LONG TERM GOAL #2   Title 5/5 right U and LE strength.   Time 8   Period Weeks   Status On-going     PT LONG TERM GOAL #3   Title Right grip= 70#.   Time 8   Period Weeks   Status Achieved     PT LONG TERM GOAL #4   Title Patient walk unassisted without deviation.   Time 8   Period Weeks   Status Achieved  09/12/16               Plan - 09/21/16 3825    Clinical Impression Statement Patient continues to tolerate treatment well with no complaints and good technique. Patient able to progress strengthening for UE and LE with greater ease. Patient close  to meeting all goals. Strength goal ongoing   Rehab Potential Excellent   PT Frequency 2x / week   PT Duration 8 weeks   PT Treatment/Interventions ADLs/Self Care Home Management;Functional mobility training;Stair training;Gait training;Therapeutic activities;Therapeutic exercise;Balance training;Neuromuscular re-education;Patient/family education   PT Next Visit Plan Continue with RUE and RLE strengthening and balance exercises DC next week   Consulted and Agree with Plan of Care Patient      Patient will benefit from skilled therapeutic intervention in order to improve the following deficits and impairments:  Decreased activity tolerance, Decreased balance, Decreased mobility, Abnormal gait  Visit Diagnosis: Unsteadiness on feet  Muscle weakness (generalized)     Problem List Patient Active Problem List   Diagnosis Date Noted  . Gastroesophageal reflux disease  without esophagitis 03/06/2015  . BMI 33.0-33.9,adult 03/06/2015  . Hypertension 09/17/2012  . Hyperlipidemia 09/17/2012  . Gout 09/17/2012    Monigue Spraggins P, PTA 09/21/2016, 9:45 AM  [facility] [place], [place], [address] Phone: [phone]   Fax:  [phone]  Name: Israel Werts MRN: [id number] Date of Birth: 04/23/1951

## 2016-09-27 ENCOUNTER — Encounter: Payer: Self-pay | Admitting: Physical Therapy

## 2016-09-27 ENCOUNTER — Ambulatory Visit: Payer: BLUE CROSS/BLUE SHIELD | Admitting: Physical Therapy

## 2016-09-27 DIAGNOSIS — R2681 Unsteadiness on feet: Secondary | ICD-10-CM | POA: Diagnosis not present

## 2016-09-27 DIAGNOSIS — M6281 Muscle weakness (generalized): Secondary | ICD-10-CM

## 2016-09-27 NOTE — Therapy (Signed)
[facility] [place], [place], [address] Phone: [phone]   Fax:  [phone]  Physical Therapy Treatment  Patient Details  Name: David Garza MRN: [id number] Date of Birth: 09/26/1950 Referring Provider: Kerri Perches MD  Encounter Date: 09/27/2016      PT End of Session - 09/27/16 0903    Visit Number 15   Number of Visits 16   Date for PT Re-Evaluation 10/03/16   PT Start Time 0901   PT Stop Time 0949   PT Time Calculation (min) 48 min   Activity Tolerance Patient tolerated treatment well   Behavior During Therapy [facility] for tasks assessed/performed      Past Medical History:  Diagnosis Date  . Hyperlipidemia   . Hypertension   . Metabolic syndrome     No past surgical history on file.  There were no vitals filed for this visit.      Subjective Assessment - 09/27/16 0859    Subjective Reports that he is still having stinging in R side and has been taking medication to help stinging for several weeks now.    Patient is accompained by: Family member  Wife   Pertinent History Right shoulder reconstruction.   How long can you stand comfortably? 5 minutes.   Patient Stated Goals Want to ger back to normal and do what I was doing before.   Currently in Pain? Other (Comment)  No pain assessment provided by patient            [facility] PT Assessment - 09/27/16 0001      Assessment   Medical Diagnosis New stroke.   Onset Date/Surgical Date 07/28/16   Hand Dominance Right   Next MD Visit 11/2016     Precautions   Precautions Fall     Restrictions   Weight Bearing Restrictions No                     [facility] Adult PT Treatment/Exercise - 09/27/16 0001      Knee/Hip Exercises: Aerobic   Elliptical L5, R1 x6 min   Nustep Level 7 x 15 minutes UE/LE     Knee/Hip Exercises: Machines for Strengthening   Cybex Knee Extension 20# 3x15 reps    Cybex Knee Flexion 40# 3x15 reps     Shoulder  Exercises: Standing   Protraction Strengthening;Theraband;Right   Theraband Level (Shoulder Protraction) Level 2 (Red)   Protraction Limitations 3x15 reps   External Rotation Strengthening;Right;Theraband  3x15 reps   Theraband Level (Shoulder External Rotation) Level 2 (Red)   Internal Rotation Strengthening;Right;Theraband  3x15 reps   Theraband Level (Shoulder Internal Rotation) Level 2 (Red)   Extension Strengthening;Right;Theraband   Theraband Level (Shoulder Extension) Level 2 (Red)   Extension Limitations 3x15 reps                     PT Long Term Goals - 09/12/16 0930      PT LONG TERM GOAL #1   Title Independent with a HEP.   Time 8   Period Weeks   Status Achieved  09/12/16     PT LONG TERM GOAL #2   Title 5/5 right U and LE strength.   Time 8   Period Weeks   Status On-going     PT LONG TERM GOAL #3   Title Right grip= 70#.   Time 8   Period Weeks   Status Achieved     PT LONG TERM GOAL #  4   Title Patient walk unassisted without deviation.   Time 8   Period Weeks   Status Achieved  09/12/16               Plan - 09/27/16 0954    Clinical Impression Statement Patient continues to do very well in treatment and able to complete increased reps. Fatigue reported primarily today following Nustep and as well as with elliptical session. No complaints of R shoulder discomfort or fatigue with shoulder strengthening exercises.   Rehab Potential Excellent   PT Frequency 2x / week   PT Duration 8 weeks   PT Treatment/Interventions ADLs/Self Care Home Management;Functional mobility training;Stair training;Gait training;Therapeutic activities;Therapeutic exercise;Balance training;Neuromuscular re-education;Patient/family education   PT Next Visit Plan D/C summary next treatment.   Consulted and Agree with Plan of Care Patient      Patient will benefit from skilled therapeutic intervention in order to improve the following deficits and impairments:   Decreased activity tolerance, Decreased balance, Decreased mobility, Abnormal gait  Visit Diagnosis: Unsteadiness on feet  Muscle weakness (generalized)     Problem List Patient Active Problem List   Diagnosis Date Noted  . Gastroesophageal reflux disease without esophagitis 03/06/2015  . BMI 33.0-33.9,adult 03/06/2015  . Hypertension 09/17/2012  . Hyperlipidemia 09/17/2012  . Gout 09/17/2012    Wynelle Fanny, PTA 09/27/2016, 10:05 AM  [facility] [place], [place], [address] Phone: [phone]   Fax:  [phone]  Name: Deniel Mcquiston MRN: [id number] Date of Birth: 1950-05-16

## 2016-09-29 ENCOUNTER — Ambulatory Visit: Payer: BLUE CROSS/BLUE SHIELD | Admitting: Physical Therapy

## 2016-09-29 ENCOUNTER — Encounter: Payer: Self-pay | Admitting: Physical Therapy

## 2016-09-29 DIAGNOSIS — R2681 Unsteadiness on feet: Secondary | ICD-10-CM

## 2016-09-29 DIAGNOSIS — M6281 Muscle weakness (generalized): Secondary | ICD-10-CM

## 2016-09-29 NOTE — Therapy (Signed)
[facility] [place], [place], [address] Phone: [phone]   Fax:  [phone]  Physical Therapy Treatment  Patient Details  Name: David Garza MRN: [id number] Date of Birth: Jan 14, 1951 Referring Provider: Kerri Perches MD  Encounter Date: 09/29/2016      PT End of Session - 09/29/16 0904    Visit Number 16   Number of Visits 16   Date for PT Re-Evaluation 10/03/16   PT Start Time 0900   PT Stop Time 0952   PT Time Calculation (min) 52 min   Activity Tolerance Patient tolerated treatment well   Behavior During Therapy [facility] for tasks assessed/performed      Past Medical History:  Diagnosis Date  . Hyperlipidemia   . Hypertension   . Metabolic syndrome     No past surgical history on file.  There were no vitals filed for this visit.      Subjective Assessment - 09/29/16 0902    Subjective Reports that he still has intermittant bouts of stinging and tingling in R hand and foot. Reports that today the stinging and tingling isn't as bad as it was yesterday. Reports that he can feel stinging and tingling in R side of face and jaw and states that he bit his jaw the other day.   Pertinent History Right shoulder reconstruction.   How long can you stand comfortably? 5 minutes.   Patient Stated Goals Want to ger back to normal and do what I was doing before.   Currently in Pain? Yes   Pain Score 7    Pain Location Generalized   Pain Orientation Right   Pain Descriptors / Indicators Tingling   Pain Type Acute pain   Pain Onset 1 to 4 weeks ago            [facility] PT Assessment - 09/29/16 0001      Assessment   Medical Diagnosis New stroke.   Onset Date/Surgical Date 07/28/16   Hand Dominance Right   Next MD Visit 11/2016     Precautions   Precautions Fall     Restrictions   Weight Bearing Restrictions No     ROM / Strength   AROM / PROM / Strength Strength     Strength   Overall Strength Deficits    Strength Assessment Site Shoulder;Hand;Hip;Knee   Right/Left Shoulder Right   Right Shoulder Flexion 5/5   Right Shoulder ABduction 4+/5  scaption   Right Shoulder Internal Rotation 5/5   Right Shoulder External Rotation 4+/5   Right Hand Grip (lbs) 90   Left Hand Grip (lbs) 95   Right/Left Hip Right   Right Hip Flexion 4+/5   Right Hip ABduction 5/5   Right/Left Knee Right   Right Knee Flexion 4+/5   Right Knee Extension 5/5                     [facility] Adult PT Treatment/Exercise - 09/29/16 0001      Knee/Hip Exercises: Aerobic   Nustep Level 7 x 15 minutes UE/LE     Knee/Hip Exercises: Machines for Strengthening   Cybex Knee Extension 20# 3x15 reps    Cybex Knee Flexion 40# 3x15 reps   Cybex Leg Press 3 pl, seat 7, 3x10 rep     Shoulder Exercises: Standing   Protraction Strengthening;Theraband;Right   Theraband Level (Shoulder Protraction) Level 2 (Red)   Protraction Limitations 3x10 reps   External Rotation Strengthening;Right;Theraband  3x10 reps  Theraband Level (Shoulder External Rotation) Level 2 (Red)   Internal Rotation Strengthening;Right;Theraband   Theraband Level (Shoulder Internal Rotation) Level 2 (Red)   Internal Rotation Limitations 3x10 reps   Extension Strengthening;Right;Theraband   Theraband Level (Shoulder Extension) Level 2 (Red)   Extension Limitations 3x10 reps   Row Strengthening;Both;Theraband   Theraband Level (Shoulder Row) Level 2 (Red)   Row Limitations 3x10 reps   Other Standing Exercises 2# D1/D2 strengthening x30 reps each                     PT Long Term Goals - 09/29/16 7322      PT LONG TERM GOAL #1   Title Independent with a HEP.   Time 8   Period Weeks   Status Achieved  09/12/16     PT LONG TERM GOAL #2   Title 5/5 right U and LE strength.   Time 8   Period Weeks   Status Partially Met  R shoulder, hip, knee 4+/5-5/5 as of 09/29/2016     PT LONG TERM GOAL #3   Title Right grip= 70#.    Time 8   Period Weeks   Status Achieved     PT LONG TERM GOAL #4   Title Patient walk unassisted without deviation.   Time 8   Period Weeks   Status Achieved  09/12/16               Plan - 09/29/16 0959    Clinical Impression Statement Patient has progressed well since beginning PT following stroke. Patient has progressed in regards to R shoulder and LE strength but still faces intermittant difficulty with stinging and tingling sensation in generalized R side of body. R grip strength has progressed to now 90# compared to 95# in L hand upon assessment today. Met all goals except for partially achieving LT regarding R UE and LE strength.   Rehab Potential Excellent   PT Frequency 2x / week   PT Duration 8 weeks   PT Treatment/Interventions ADLs/Self Care Home Management;Functional mobility training;Stair training;Gait training;Therapeutic activities;Therapeutic exercise;Balance training;Neuromuscular re-education;Patient/family education   PT Next Visit Plan D/C summary required today.   Consulted and Agree with Plan of Care Patient      Patient will benefit from skilled therapeutic intervention in order to improve the following deficits and impairments:  Decreased activity tolerance, Decreased balance, Decreased mobility, Abnormal gait  Visit Diagnosis: Unsteadiness on feet  Muscle weakness (generalized)     Problem List Patient Active Problem List   Diagnosis Date Noted  . Gastroesophageal reflux disease without esophagitis 03/06/2015  . BMI 33.0-33.9,adult 03/06/2015  . Hypertension 09/17/2012  . Hyperlipidemia 09/17/2012  . Gout 09/17/2012    Ahmed Prima, PTA 09/29/16 10:04 AM  [facility] [address] [place], [place], [address] Phone: [phone]   Fax:  [phone]  Name: David Garza MRN: [id number] Date of Birth: [birth year]  PHYSICAL THERAPY DISCHARGE SUMMARY  Visits from Start of Care:  16.  Current functional level related to goals / functional outcomes: See above.   Remaining deficits: Minimal right U/LE strength deficit.   Education / Equipment: HEP. Plan: Patient agrees to discharge.  Patient goals were partially met. Patient is being discharged due to being pleased with the current functional level.  ?????         Mali Applegate MPT

## 2016-10-10 ENCOUNTER — Encounter: Payer: Self-pay | Admitting: Family

## 2016-10-10 ENCOUNTER — Ambulatory Visit (INDEPENDENT_AMBULATORY_CARE_PROVIDER_SITE_OTHER): Payer: BLUE CROSS/BLUE SHIELD | Admitting: Family

## 2016-10-10 VITALS — BP 139/94 | HR 77 | Temp 97.3°F | Ht 68.0 in | Wt 212.2 lb

## 2016-10-10 DIAGNOSIS — M792 Neuralgia and neuritis, unspecified: Secondary | ICD-10-CM

## 2016-10-10 DIAGNOSIS — G5690 Unspecified mononeuropathy of unspecified upper limb: Secondary | ICD-10-CM

## 2016-10-10 MED ORDER — PREGABALIN 50 MG PO CAPS: 50.0000 mg | ORAL_CAPSULE | Freq: Three times a day (TID) | ORAL | 1 refills | Status: DC

## 2016-10-10 NOTE — Progress Notes (Signed)
   Subjective:    Patient ID: David Garza, male    DOB: 06-23-1950, 66 y.o.   MRN: [id number]  HPI PT presents to the office today with burning and tingling pain in his right arm after a CVA March 2018. PT was started on gabapentin 08/15/16. PT states since starting the gabapentin he has had tremors and insomnia. Pt states he feels like he is "jerking in bilateral legs" and "shaking in bilateral hands". He reports he continues to have a burning constant  pain of 9 out 10 in his right arm.   Review of Systems  Neurological: Positive for tremors and numbness.  All other systems reviewed and are negative.      Objective:   Physical Exam  Constitutional: He is oriented to person, place, and time. He appears well-developed and well-nourished. No distress.  HENT:  Head: Normocephalic.  Eyes: Pupils are equal, round, and reactive to light. Right eye exhibits no discharge. Left eye exhibits no discharge.  Neck: Normal range of motion. Neck supple. No thyromegaly present.  Cardiovascular: Normal rate, regular rhythm, normal heart sounds and intact distal pulses.   No murmur heard. Pulmonary/Chest: Effort normal and breath sounds normal. No respiratory distress. He has no wheezes.  Abdominal: Soft. Bowel sounds are normal. He exhibits no distension. There is no tenderness.  Musculoskeletal: Normal range of motion. He exhibits no edema or tenderness.  Neurological: He is alert and oriented to person, place, and time.  Skin: Skin is warm and dry. No rash noted. No erythema.  Psychiatric: He has a normal mood and affect. His behavior is normal. Judgment and thought content normal.  Vitals reviewed.    BP (!) 139/94   Pulse 77   Temp 97.3 F (36.3 C) (Oral)   Ht 5\' 8"  (1.727 m)   Wt 212 lb 3.2 oz (96.3 kg)   BMI 32.26 kg/m      Assessment & Plan:  1. Neuropathic pain, arm -Pt to stop gabapentin and will try Lyrica today Follow up with PCP in one month Continue Physical therapy    Avoid alcohol  - pregabalin (LYRICA) 50 MG capsule; Take 1 capsule (50 mg total) by mouth 3 (three) times daily.  Dispense: 90 capsule; Refill: 1

## 2016-10-10 NOTE — Patient Instructions (Signed)
Neuropathic Pain Neuropathic pain is pain caused by damage to the nerves that are responsible for certain sensations in your body (sensory nerves). The pain can be caused by damage to:  The sensory nerves that send signals to your spinal cord and brain (peripheral nervous system).  The sensory nerves in your brain or spinal cord (central nervous system).  Neuropathic pain can make you more sensitive to pain. What would be a minor sensation for most people may feel very painful if you have neuropathic pain. This is usually a long-term condition that can be difficult to treat. The type of pain can differ from person to person. It may start suddenly (acute), or it may develop slowly and last for a long time (chronic). Neuropathic pain may come and go as damaged nerves heal or may stay at the same level for years. It often causes emotional distress, loss of sleep, and a lower quality of life. What are the causes? The most common cause of damage to a sensory nerve is diabetes. Many other diseases and conditions can also cause neuropathic pain. Causes of neuropathic pain can be classified as:  Toxic. Many drugs and chemicals can cause toxic damage. The most common cause of toxic neuropathic pain is damage from drug treatment for cancer (chemotherapy).  Metabolic. This type of pain can happen when a disease causes imbalances that damage nerves. Diabetes is the most common of these diseases. Vitamin B deficiency caused by long-term alcohol abuse is another common cause.  Traumatic. Any injury that cuts, crushes, or stretches a nerve can cause damage and pain. A common example is feeling pain after losing an arm or leg (phantom limb pain).  Compression-related. If a sensory nerve gets trapped or compressed for a long period of time, the blood supply to the nerve can be cut off.  Vascular. Many blood vessel diseases can cause neuropathic pain by decreasing blood supply and oxygen to nerves.  Autoimmune.  This type of pain results from diseases in which the body's defense system mistakenly attacks sensory nerves. Examples of autoimmune diseases that can cause neuropathic pain include lupus and multiple sclerosis.  Infectious. Many types of viral infections can damage sensory nerves and cause pain. Shingles infection is a common cause of this type of pain.  Inherited. Neuropathic pain can be a symptom of many diseases that are passed down through families (genetic).  What are the signs or symptoms? The main symptom is pain. Neuropathic pain is often described as:  Burning.  Shock-like.  Stinging.  Hot or cold.  Itching.  How is this diagnosed? No single test can diagnose neuropathic pain. Your health care provider will do a physical exam and ask you about your pain. You may use a pain scale to describe how bad your pain is. You may also have tests to see if you have a high sensitivity to pain and to help find the cause and location of any sensory nerve damage. These tests may include:  Imaging studies, such as: ? X-rays. ? CT scan. ? MRI.  Nerve conduction studies to test how well nerve signals travel through your sensory nerves (electrodiagnostic testing).  Stimulating your sensory nerves through electrodes on your skin and measuring the response in your spinal cord and brain (somatosensory evoked potentials).  How is this treated? Treatment for neuropathic pain may change over time. You may need to try different treatment options or a combination of treatments. Some options include:  Over-the-counter pain relievers.  Prescription medicines. Some medicines   used to treat other conditions may also help neuropathic pain. These include medicines to: ? Control seizures (anticonvulsants). ? Relieve depression (antidepressants).  Prescription-strength pain relievers (narcotics). These are usually used when other pain relievers do not help.  Transcutaneous nerve stimulation (TENS).  This uses electrical currents to block painful nerve signals. The treatment is painless.  Topical and local anesthetics. These are medicines that numb the nerves. They can be injected as a nerve block or applied to the skin.  Alternative treatments, such as: ? Acupuncture. ? Meditation. ? Massage. ? Physical therapy. ? Pain management programs. ? Counseling.  Follow these instructions at home:  Learn as much as you can about your condition.  Take medicines only as directed by your health care provider.  Work closely with all your health care providers to find what works best for you.  Have a good support system at home.  Consider joining a chronic pain support group. Contact a health care provider if:  Your pain treatments are not helping.  You are having side effects from your medicines.  You are struggling with fatigue, mood changes, depression, or anxiety. This information is not intended to replace advice given to you by your health care provider. Make sure you discuss any questions you have with your health care provider. Document Released: 01/14/2004 Document Revised: 11/06/2015 Document Reviewed: 09/26/2013 Elsevier Interactive Patient Education  2018 Elsevier Inc.  

## 2016-11-20 ENCOUNTER — Other Ambulatory Visit: Payer: Self-pay | Admitting: Nurse Practitioner

## 2016-11-20 DIAGNOSIS — E782 Mixed hyperlipidemia: Secondary | ICD-10-CM

## 2016-12-05 ENCOUNTER — Other Ambulatory Visit: Payer: BLUE CROSS/BLUE SHIELD

## 2016-12-05 DIAGNOSIS — I10 Essential (primary) hypertension: Secondary | ICD-10-CM

## 2016-12-05 DIAGNOSIS — E785 Hyperlipidemia, unspecified: Secondary | ICD-10-CM

## 2016-12-06 LAB — CMP14+EGFR
A/G RATIO: 2.1 (ref 1.2–2.2)
ALK PHOS: 39 IU/L (ref 39–117)
ALT: 15 IU/L (ref 0–44)
AST: 14 IU/L (ref 0–40)
Albumin: 4.6 g/dL (ref 3.6–4.8)
BILIRUBIN TOTAL: 0.8 mg/dL (ref 0.0–1.2)
BUN/Creatinine Ratio: 9 — ABNORMAL LOW (ref 10–24)
BUN: 10 mg/dL (ref 8–27)
CHLORIDE: 103 mmol/L (ref 96–106)
CO2: 24 mmol/L (ref 20–29)
Calcium: 9.9 mg/dL (ref 8.6–10.2)
Creatinine, Ser: 1.14 mg/dL (ref 0.76–1.27)
GFR calc non Af Amer: 68 mL/min/{1.73_m2} (ref >59)
GFR, EST AFRICAN AMERICAN: 78 mL/min/{1.73_m2} (ref >59)
Globulin, Total: 2.2 g/dL (ref 1.5–4.5)
Glucose: 104 mg/dL — ABNORMAL HIGH (ref 65–99)
POTASSIUM: 4.1 mmol/L (ref 3.5–5.2)
Sodium: 144 mmol/L (ref 134–144)
TOTAL PROTEIN: 6.8 g/dL (ref 6.0–8.5)

## 2016-12-06 LAB — LIPID PANEL
Chol/HDL Ratio: 3.9 ratio (ref 0.0–5.0)
Cholesterol, Total: 128 mg/dL (ref 100–199)
HDL: 33 mg/dL — AB (ref >39)
LDL Calculated: 76 mg/dL (ref 0–99)
Triglycerides: 94 mg/dL (ref 0–149)
VLDL Cholesterol Cal: 19 mg/dL (ref 5–40)

## 2016-12-09 ENCOUNTER — Ambulatory Visit (INDEPENDENT_AMBULATORY_CARE_PROVIDER_SITE_OTHER): Payer: BLUE CROSS/BLUE SHIELD | Admitting: Nurse Practitioner

## 2016-12-09 ENCOUNTER — Encounter: Payer: Self-pay | Admitting: Nurse Practitioner

## 2016-12-09 ENCOUNTER — Other Ambulatory Visit: Payer: Self-pay

## 2016-12-09 VITALS — BP 138/87 | HR 79 | Temp 97.5°F | Ht 68.0 in | Wt 214.0 lb

## 2016-12-09 DIAGNOSIS — I10 Essential (primary) hypertension: Secondary | ICD-10-CM

## 2016-12-09 DIAGNOSIS — E785 Hyperlipidemia, unspecified: Secondary | ICD-10-CM

## 2016-12-09 DIAGNOSIS — G5690 Unspecified mononeuropathy of unspecified upper limb: Secondary | ICD-10-CM

## 2016-12-09 DIAGNOSIS — E782 Mixed hyperlipidemia: Secondary | ICD-10-CM | POA: Diagnosis not present

## 2016-12-09 DIAGNOSIS — K219 Gastro-esophageal reflux disease without esophagitis: Secondary | ICD-10-CM | POA: Diagnosis not present

## 2016-12-09 DIAGNOSIS — I6930 Unspecified sequelae of cerebral infarction: Secondary | ICD-10-CM | POA: Diagnosis not present

## 2016-12-09 DIAGNOSIS — M10072 Idiopathic gout, left ankle and foot: Secondary | ICD-10-CM

## 2016-12-09 DIAGNOSIS — Z6833 Body mass index (BMI) 33.0-33.9, adult: Secondary | ICD-10-CM | POA: Diagnosis not present

## 2016-12-09 DIAGNOSIS — M792 Neuralgia and neuritis, unspecified: Secondary | ICD-10-CM

## 2016-12-09 MED ORDER — OMEPRAZOLE 40 MG PO CPDR: 40.0000 mg | DELAYED_RELEASE_CAPSULE | Freq: Every day | ORAL | 1 refills | Status: DC

## 2016-12-09 MED ORDER — CLOPIDOGREL BISULFATE 75 MG PO TABS: 75.0000 mg | ORAL_TABLET | Freq: Every day | ORAL | 1 refills | Status: DC

## 2016-12-09 MED ORDER — PREGABALIN 50 MG PO CAPS: 50.0000 mg | ORAL_CAPSULE | Freq: Three times a day (TID) | ORAL | 3 refills | Status: DC

## 2016-12-09 MED ORDER — PREGABALIN 50 MG PO CAPS
50.0000 mg | ORAL_CAPSULE | Freq: Three times a day (TID) | ORAL | 1 refills | Status: DC
Start: 2016-12-09 — End: 2016-12-09

## 2016-12-09 MED ORDER — LISINOPRIL-HYDROCHLOROTHIAZIDE 20-25 MG PO TABS: 1.0000 | ORAL_TABLET | Freq: Every day | ORAL | 1 refills | Status: DC

## 2016-12-09 MED ORDER — PRAVASTATIN SODIUM 80 MG PO TABS: 80.0000 mg | ORAL_TABLET | Freq: Every day | ORAL | 1 refills | Status: DC

## 2016-12-09 NOTE — Patient Instructions (Signed)
Neuropathic Pain Neuropathic pain is pain caused by damage to the nerves that are responsible for certain sensations in your body (sensory nerves). The pain can be caused by damage to:  The sensory nerves that send signals to your spinal cord and brain (peripheral nervous system).  The sensory nerves in your brain or spinal cord (central nervous system).  Neuropathic pain can make you more sensitive to pain. What would be a minor sensation for most people may feel very painful if you have neuropathic pain. This is usually a long-term condition that can be difficult to treat. The type of pain can differ from person to person. It may start suddenly (acute), or it may develop slowly and last for a long time (chronic). Neuropathic pain may come and go as damaged nerves heal or may stay at the same level for years. It often causes emotional distress, loss of sleep, and a lower quality of life. What are the causes? The most common cause of damage to a sensory nerve is diabetes. Many other diseases and conditions can also cause neuropathic pain. Causes of neuropathic pain can be classified as:  Toxic. Many drugs and chemicals can cause toxic damage. The most common cause of toxic neuropathic pain is damage from drug treatment for cancer (chemotherapy).  Metabolic. This type of pain can happen when a disease causes imbalances that damage nerves. Diabetes is the most common of these diseases. Vitamin B deficiency caused by long-term alcohol abuse is another common cause.  Traumatic. Any injury that cuts, crushes, or stretches a nerve can cause damage and pain. A common example is feeling pain after losing an arm or leg (phantom limb pain).  Compression-related. If a sensory nerve gets trapped or compressed for a long period of time, the blood supply to the nerve can be cut off.  Vascular. Many blood vessel diseases can cause neuropathic pain by decreasing blood supply and oxygen to nerves.  Autoimmune.  This type of pain results from diseases in which the body's defense system mistakenly attacks sensory nerves. Examples of autoimmune diseases that can cause neuropathic pain include lupus and multiple sclerosis.  Infectious. Many types of viral infections can damage sensory nerves and cause pain. Shingles infection is a common cause of this type of pain.  Inherited. Neuropathic pain can be a symptom of many diseases that are passed down through families (genetic).  What are the signs or symptoms? The main symptom is pain. Neuropathic pain is often described as:  Burning.  Shock-like.  Stinging.  Hot or cold.  Itching.  How is this diagnosed? No single test can diagnose neuropathic pain. Your health care provider will do a physical exam and ask you about your pain. You may use a pain scale to describe how bad your pain is. You may also have tests to see if you have a high sensitivity to pain and to help find the cause and location of any sensory nerve damage. These tests may include:  Imaging studies, such as: ? X-rays. ? CT scan. ? MRI.  Nerve conduction studies to test how well nerve signals travel through your sensory nerves (electrodiagnostic testing).  Stimulating your sensory nerves through electrodes on your skin and measuring the response in your spinal cord and brain (somatosensory evoked potentials).  How is this treated? Treatment for neuropathic pain may change over time. You may need to try different treatment options or a combination of treatments. Some options include:  Over-the-counter pain relievers.  Prescription medicines. Some medicines   used to treat other conditions may also help neuropathic pain. These include medicines to: ? Control seizures (anticonvulsants). ? Relieve depression (antidepressants).  Prescription-strength pain relievers (narcotics). These are usually used when other pain relievers do not help.  Transcutaneous nerve stimulation (TENS).  This uses electrical currents to block painful nerve signals. The treatment is painless.  Topical and local anesthetics. These are medicines that numb the nerves. They can be injected as a nerve block or applied to the skin.  Alternative treatments, such as: ? Acupuncture. ? Meditation. ? Massage. ? Physical therapy. ? Pain management programs. ? Counseling.  Follow these instructions at home:  Learn as much as you can about your condition.  Take medicines only as directed by your health care provider.  Work closely with all your health care providers to find what works best for you.  Have a good support system at home.  Consider joining a chronic pain support group. Contact a health care provider if:  Your pain treatments are not helping.  You are having side effects from your medicines.  You are struggling with fatigue, mood changes, depression, or anxiety. This information is not intended to replace advice given to you by your health care provider. Make sure you discuss any questions you have with your health care provider. Document Released: 01/14/2004 Document Revised: 11/06/2015 Document Reviewed: 09/26/2013 Elsevier Interactive Patient Education  2018 Elsevier Inc.  

## 2016-12-09 NOTE — Progress Notes (Signed)
Subjective:    Patient ID: David Garza, male    DOB: [birth year], 66 y.o.   MRN: [id number]  HPI  Larson Limones is here today for follow up of chronic medical problem.  Outpatient Encounter Prescriptions as of 12/09/2016  Medication Sig  . clopidogrel (PLAVIX) 75 MG tablet Take 1 tablet (75 mg total) by mouth daily.  . fish oil-omega-3 fatty acids 1000 MG capsule Take 2 g by mouth daily.  Marland Kitchen lisinopril-hydrochlorothiazide (PRINZIDE,ZESTORETIC) 20-25 MG tablet Take 1 tablet by mouth daily.  Marland Kitchen omeprazole (PRILOSEC) 40 MG capsule Take 1 capsule (40 mg total) by mouth daily.  . pravastatin (PRAVACHOL) 80 MG tablet Take 1 tablet (80 mg total) by mouth daily.  . pregabalin (LYRICA) 50 MG capsule Take 1 capsule (50 mg total) by mouth 3 (three) times daily.    1. Essential hypertension  No c/o chest pain ,sob or headaches. Checks blood pressure occasionaly  At home and is always below 676 systolic.  2. Gastroesophageal reflux disease without esophagitis  prilosec works well to keep his symptoms under control  3. Acute idiopathic gout of left foot  Has had no recent flare ups  4. BMI 33.0-33.9,adult  No recent weight changes  5. Late effect of cerebrovascular accident (CVA)  Had stroke several years ago- he says he feels no permanent effects from that other than tingling of right hand and foot . He is on plavix however to help prevent future events  6. Mixed hyperlipidemia  Does ot watch diet very closely  7. Neuropathic pain, arm  Takes lyrica daily to help with nerve pain    New complaints: None today  Social history: Wife is with him today and she takes care of all his medications and makes sure he is doing what he is suppose to for his health    Review of Systems  Constitutional: Negative for activity change and appetite change.  HENT: Negative.   Eyes: Negative for pain.  Respiratory: Negative for shortness of breath.   Cardiovascular: Negative for chest pain,  palpitations and leg swelling.  Gastrointestinal: Negative for abdominal pain.  Endocrine: Negative for polydipsia.  Genitourinary: Negative.   Skin: Negative for rash.  Neurological: Negative for dizziness, weakness and headaches.  Hematological: Does not bruise/bleed easily.  Psychiatric/Behavioral: Negative.   All other systems reviewed and are negative.      Objective:   Physical Exam  Constitutional: He is oriented to person, place, and time. He appears well-developed and well-nourished.  HENT:  Head: Normocephalic.  Right Ear: External ear normal.  Left Ear: External ear normal.  Nose: Nose normal.  Mouth/Throat: Oropharynx is clear and moist.  Eyes: Pupils are equal, round, and reactive to light. EOM are normal.  Neck: Normal range of motion. Neck supple. No JVD present. No thyromegaly present.  Cardiovascular: Normal rate, regular rhythm, normal heart sounds and intact distal pulses.  Exam reveals no gallop and no friction rub.   No murmur heard. Pulmonary/Chest: Effort normal and breath sounds normal. No respiratory distress. He has no wheezes. He has no rales. He exhibits no tenderness.  Abdominal: Soft. Bowel sounds are normal. He exhibits no mass. There is no tenderness.  Genitourinary: Prostate normal and penis normal.  Musculoskeletal: Normal range of motion. He exhibits no edema.  Mild right sided weakness  Lymphadenopathy:    He has no cervical adenopathy.  Neurological: He is alert and oriented to person, place, and time. No cranial nerve deficit.  Skin: Skin is  warm and dry.  Psychiatric: He has a normal mood and affect. His behavior is normal. Judgment and thought content normal.    BP 138/87   Pulse 79   Temp (!) 97.5 F (36.4 C) (Oral)   Ht 5\' 8"  (1.727 m)   Wt 214 lb (97.1 kg)   BMI 32.54 kg/m        Assessment & Plan:  1. Essential hypertension Low sodium diet - lisinopril-hydrochlorothiazide (PRINZIDE,ZESTORETIC) 20-25 MG tablet; Take 1  tablet by mouth daily.  Dispense: 90 tablet; Refill: 1  2. Gastroesophageal reflux disease without esophagitis Avoid spicy foods Do not eat 2 hours prior to bedtime - omeprazole (PRILOSEC) 40 MG capsule; Take 1 capsule (40 mg total) by mouth daily.  Dispense: 90 capsule; Refill: 1  3. Acute idiopathic gout of left foot Low purine diet  4. BMI 33.0-33.9,adult Discussed diet and exercise for person with BMI >25 Will recheck weight in 3-6 months  5. Late effect of cerebrovascular accident (CVA) - clopidogrel (PLAVIX) 75 MG tablet; Take 1 tablet (75 mg total) by mouth daily.  Dispense: 90 tablet; Refill: 1  6. Mixed hyperlipidemia Low fat diet - pravastatin (PRAVACHOL) 80 MG tablet; Take 1 tablet (80 mg total) by mouth daily.  Dispense: 90 tablet; Refill: 1  7. Neuropathic pain, arm Continue therapy to see if helps - pregabalin (LYRICA) 50 MG capsule; Take 1 capsule (50 mg total) by mouth 3 (three) times daily.  Dispense: 90 capsule; Refill: 1    Labs pending Health maintenance reviewed Diet and exercise encouraged Continue all meds Follow up  In 3 months   [place], FNP

## 2016-12-09 NOTE — Addendum Note (Signed)
Addended by: [name] on: 12/09/2016 08:41 AM   Modules accepted: Orders

## 2016-12-13 ENCOUNTER — Other Ambulatory Visit: Payer: Self-pay | Admitting: Nurse Practitioner

## 2016-12-13 DIAGNOSIS — I6930 Unspecified sequelae of cerebral infarction: Secondary | ICD-10-CM

## 2017-01-10 ENCOUNTER — Ambulatory Visit (INDEPENDENT_AMBULATORY_CARE_PROVIDER_SITE_OTHER): Payer: BLUE CROSS/BLUE SHIELD | Admitting: Nurse Practitioner

## 2017-01-10 ENCOUNTER — Encounter: Payer: Self-pay | Admitting: Nurse Practitioner

## 2017-01-10 VITALS — BP 133/80 | HR 74 | Temp 98.6°F | Ht 68.0 in | Wt 216.0 lb

## 2017-01-10 DIAGNOSIS — G63 Polyneuropathy in diseases classified elsewhere: Secondary | ICD-10-CM | POA: Diagnosis not present

## 2017-01-10 DIAGNOSIS — I6930 Unspecified sequelae of cerebral infarction: Secondary | ICD-10-CM | POA: Insufficient documentation

## 2017-01-10 MED ORDER — PREGABALIN 75 MG PO CAPS: 75.0000 mg | ORAL_CAPSULE | Freq: Three times a day (TID) | ORAL | 1 refills | Status: DC

## 2017-01-10 MED ORDER — AMITRIPTYLINE HCL 25 MG PO TABS: 25.0000 mg | ORAL_TABLET | Freq: Every day | ORAL | 3 refills | Status: DC

## 2017-01-10 NOTE — Patient Instructions (Signed)
Neuropathic Pain Neuropathic pain is pain caused by damage to the nerves that are responsible for certain sensations in your body (sensory nerves). The pain can be caused by damage to:  The sensory nerves that send signals to your spinal cord and brain (peripheral nervous system).  The sensory nerves in your brain or spinal cord (central nervous system).  Neuropathic pain can make you more sensitive to pain. What would be a minor sensation for most people may feel very painful if you have neuropathic pain. This is usually a long-term condition that can be difficult to treat. The type of pain can differ from person to person. It may start suddenly (acute), or it may develop slowly and last for a long time (chronic). Neuropathic pain may come and go as damaged nerves heal or may stay at the same level for years. It often causes emotional distress, loss of sleep, and a lower quality of life. What are the causes? The most common cause of damage to a sensory nerve is diabetes. Many other diseases and conditions can also cause neuropathic pain. Causes of neuropathic pain can be classified as:  Toxic. Many drugs and chemicals can cause toxic damage. The most common cause of toxic neuropathic pain is damage from drug treatment for cancer (chemotherapy).  Metabolic. This type of pain can happen when a disease causes imbalances that damage nerves. Diabetes is the most common of these diseases. Vitamin B deficiency caused by long-term alcohol abuse is another common cause.  Traumatic. Any injury that cuts, crushes, or stretches a nerve can cause damage and pain. A common example is feeling pain after losing an arm or leg (phantom limb pain).  Compression-related. If a sensory nerve gets trapped or compressed for a long period of time, the blood supply to the nerve can be cut off.  Vascular. Many blood vessel diseases can cause neuropathic pain by decreasing blood supply and oxygen to nerves.  Autoimmune.  This type of pain results from diseases in which the body's defense system mistakenly attacks sensory nerves. Examples of autoimmune diseases that can cause neuropathic pain include lupus and multiple sclerosis.  Infectious. Many types of viral infections can damage sensory nerves and cause pain. Shingles infection is a common cause of this type of pain.  Inherited. Neuropathic pain can be a symptom of many diseases that are passed down through families (genetic).  What are the signs or symptoms? The main symptom is pain. Neuropathic pain is often described as:  Burning.  Shock-like.  Stinging.  Hot or cold.  Itching.  How is this diagnosed? No single test can diagnose neuropathic pain. Your health care provider will do a physical exam and ask you about your pain. You may use a pain scale to describe how bad your pain is. You may also have tests to see if you have a high sensitivity to pain and to help find the cause and location of any sensory nerve damage. These tests may include:  Imaging studies, such as: ? X-rays. ? CT scan. ? MRI.  Nerve conduction studies to test how well nerve signals travel through your sensory nerves (electrodiagnostic testing).  Stimulating your sensory nerves through electrodes on your skin and measuring the response in your spinal cord and brain (somatosensory evoked potentials).  How is this treated? Treatment for neuropathic pain may change over time. You may need to try different treatment options or a combination of treatments. Some options include:  Over-the-counter pain relievers.  Prescription medicines. Some medicines   used to treat other conditions may also help neuropathic pain. These include medicines to: ? Control seizures (anticonvulsants). ? Relieve depression (antidepressants).  Prescription-strength pain relievers (narcotics). These are usually used when other pain relievers do not help.  Transcutaneous nerve stimulation (TENS).  This uses electrical currents to block painful nerve signals. The treatment is painless.  Topical and local anesthetics. These are medicines that numb the nerves. They can be injected as a nerve block or applied to the skin.  Alternative treatments, such as: ? Acupuncture. ? Meditation. ? Massage. ? Physical therapy. ? Pain management programs. ? Counseling.  Follow these instructions at home:  Learn as much as you can about your condition.  Take medicines only as directed by your health care provider.  Work closely with all your health care providers to find what works best for you.  Have a good support system at home.  Consider joining a chronic pain support group. Contact a health care provider if:  Your pain treatments are not helping.  You are having side effects from your medicines.  You are struggling with fatigue, mood changes, depression, or anxiety. This information is not intended to replace advice given to you by your health care provider. Make sure you discuss any questions you have with your health care provider. Document Released: 01/14/2004 Document Revised: 11/06/2015 Document Reviewed: 09/26/2013 Elsevier Interactive Patient Education  2018 Elsevier Inc.  

## 2017-01-10 NOTE — Progress Notes (Signed)
   Subjective:    Patient ID: David Garza, male    DOB: [birth year], 66 y.o.   MRN: [id number]  HPI Patient come sin today to discuss medications. He has been on lyrica since he had his stroke for the neuropathy in his right hand and froearm and right lower leg and foot. It has gotten to where they cannot afford it unless they use mail order. He took hi last one yesterday and it os going to take at least 7 days for mail order to get rx to them. While waiting he says he will just take neurotin that he has at home.    Review of Systems  Constitutional: Negative.   Respiratory: Negative.   Cardiovascular: Negative.   Neurological:       Burning and tingling of right forearm , hand , right lower leg and foot  All other systems reviewed and are negative.      Objective:   Physical Exam  Constitutional: He appears well-developed and well-nourished. No distress.  Cardiovascular: Normal rate.   Musculoskeletal:  Right grip weaker then left strength equal bil lower ext. monofilament of hand and foot intact  Skin: Skin is warm.  Psychiatric: He has a normal mood and affect. His behavior is normal. Judgment and thought content normal.   BP 133/80   Pulse 74   Temp 98.6 F (37 C) (Oral)   Ht 5\' 8"  (1.727 m)   Wt 216 lb (98 kg)   BMI 32.84 kg/m      Assessment & Plan:  1. Neuropathy due to medical condition [facility]) Continue physical therapy Started on elavil to see if helps RTO prn - pregabalin (LYRICA) 75 MG capsule; Take 1 capsule (75 mg total) by mouth 3 (three) times daily.  Dispense: 270 capsule; Refill: 1 - amitriptyline (ELAVIL) 25 MG tablet; Take 1 tablet (25 mg total) by mouth at bedtime.  Dispense: 30 tablet; Refill: Earl, FNP

## 2017-02-03 ENCOUNTER — Ambulatory Visit (INDEPENDENT_AMBULATORY_CARE_PROVIDER_SITE_OTHER): Payer: BLUE CROSS/BLUE SHIELD | Admitting: *Deleted

## 2017-02-03 DIAGNOSIS — Z23 Encounter for immunization: Secondary | ICD-10-CM | POA: Diagnosis not present

## 2017-03-02 IMAGING — CR DG KNEE 1-2V*L*
2 series · 2 of 2 positions shown · non-contrast
Comparison: Report of a right knee series of August 19, 2013

CLINICAL DATA: Left knee pain without known injury, history of
gout.

EXAM:
LEFT KNEE - 1-2 VIEW

[view not recorded (1 of 2)]
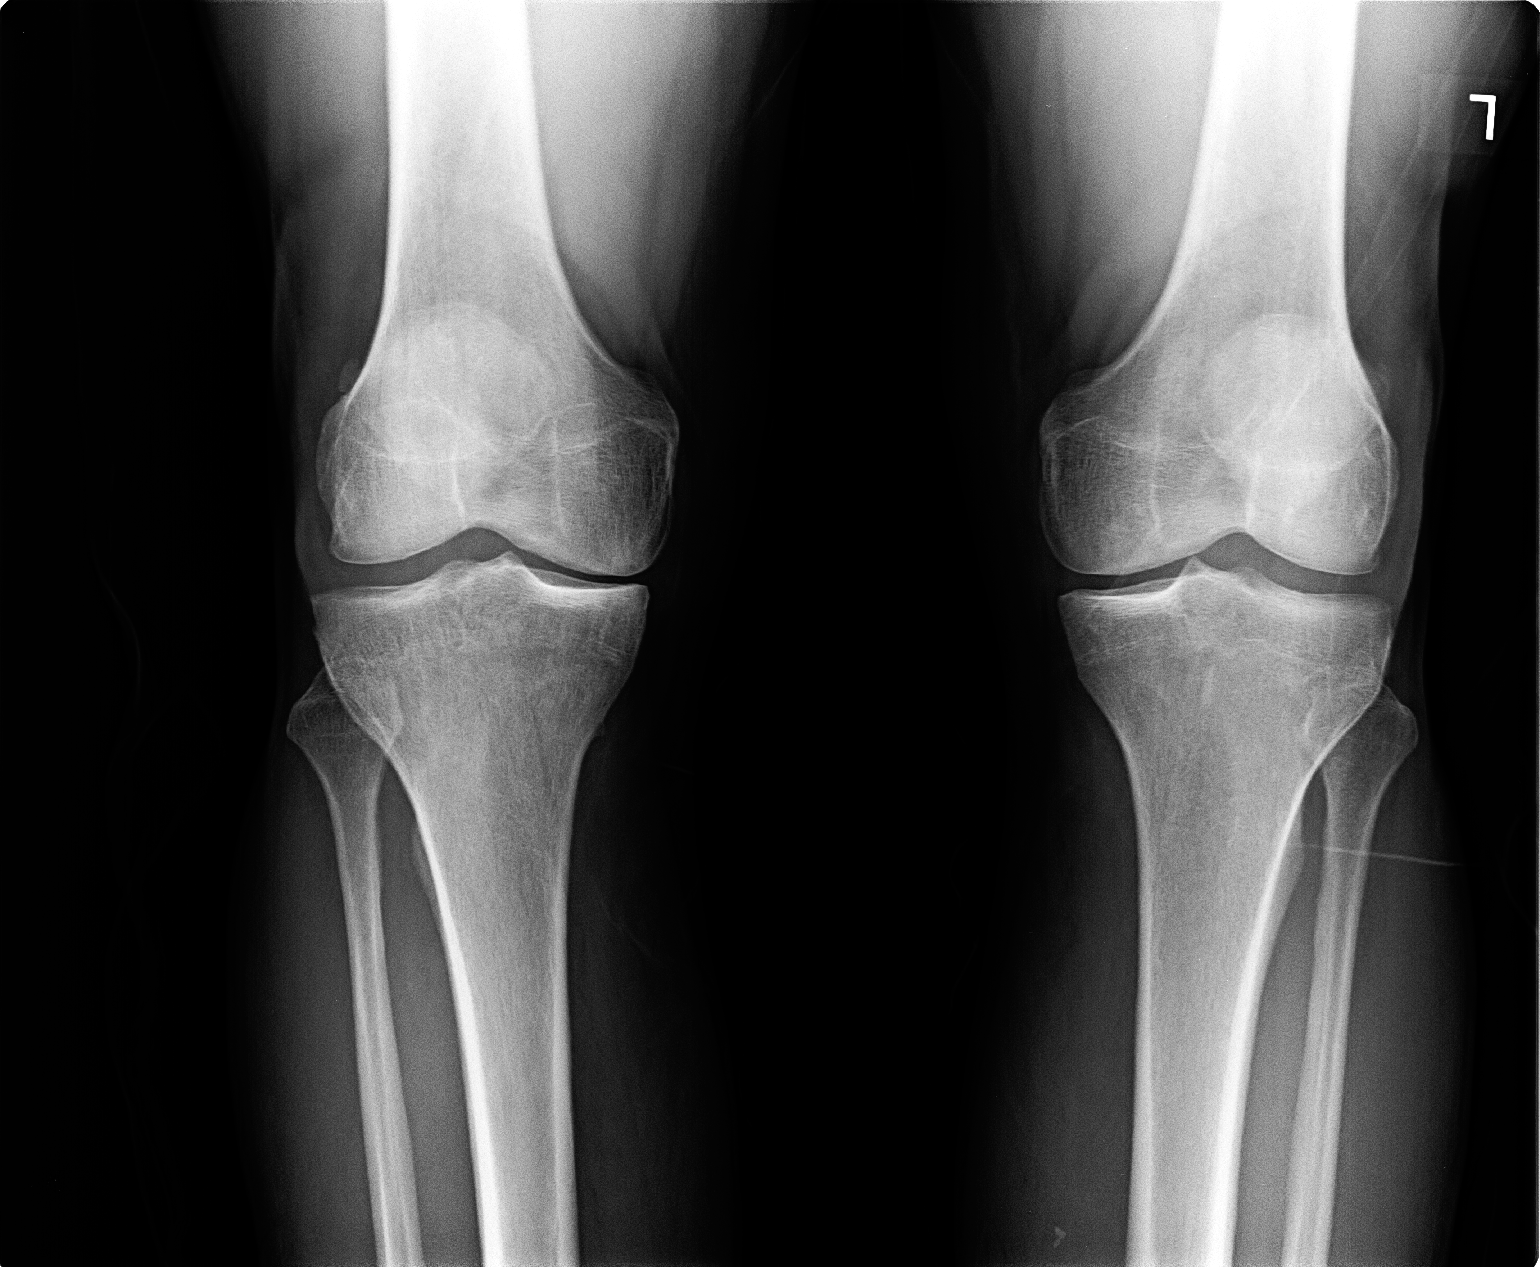

[view not recorded (2 of 2)]
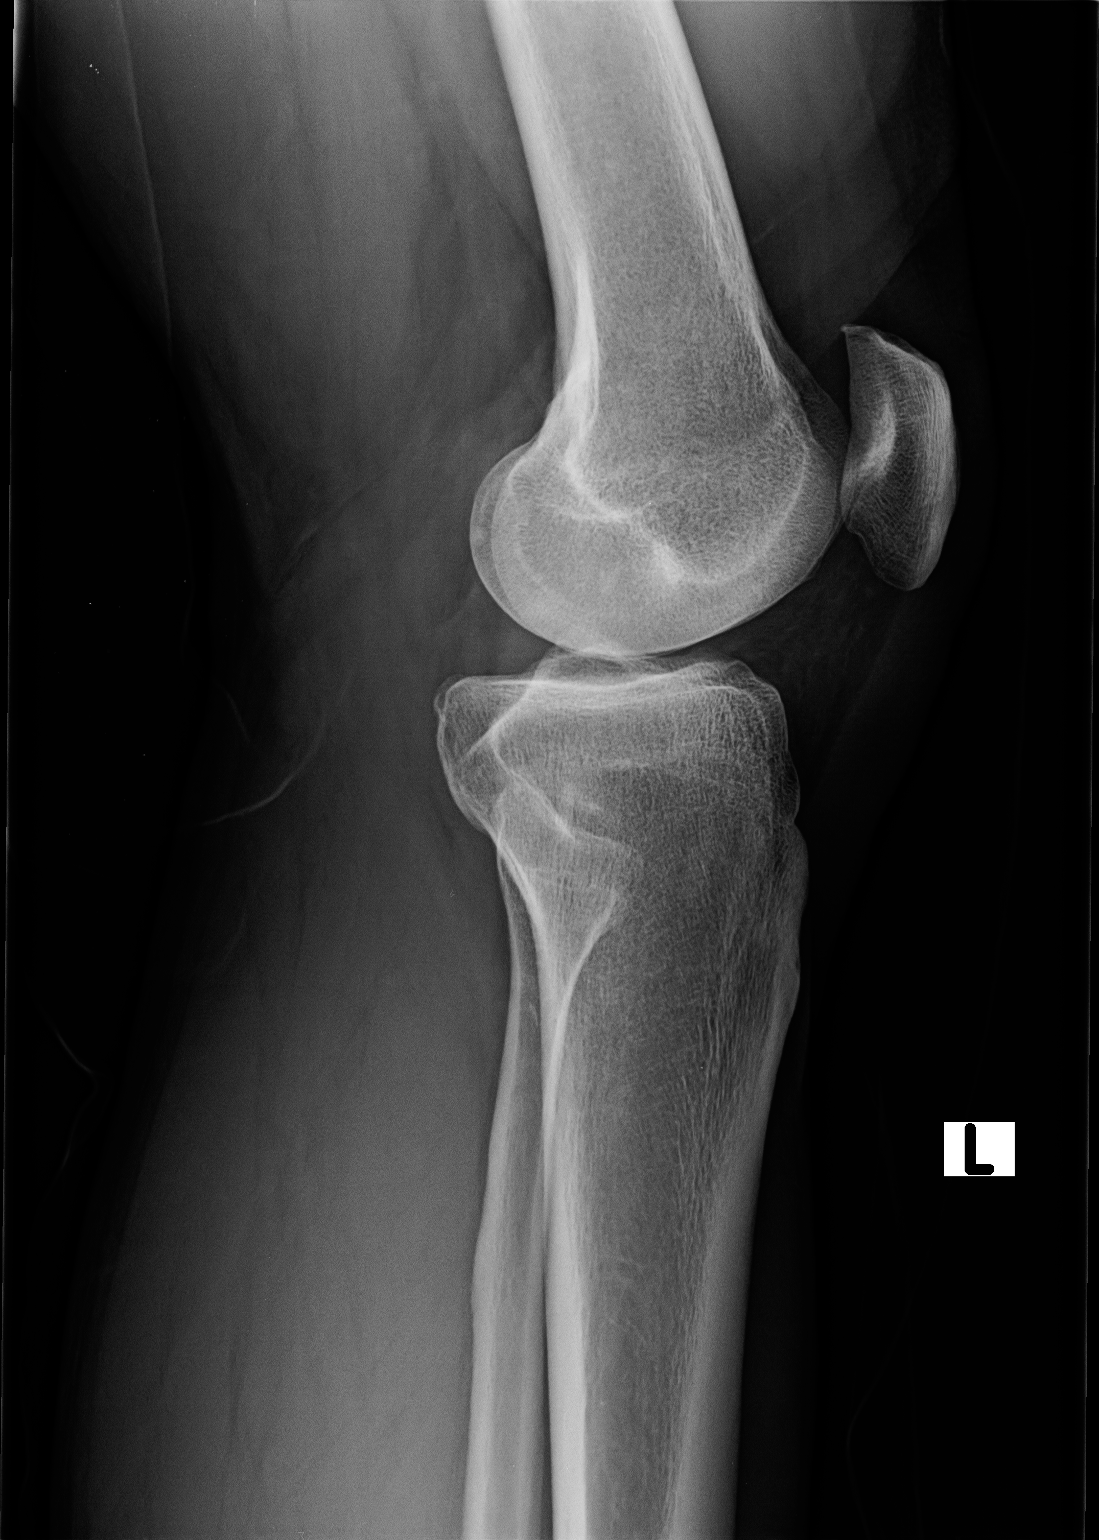

[2 of 2 positions shown; findings below may reference images not displayed]

FINDINGS: AP views of both knees reveal moderate narrowing of the medial joint
compartment on the right and milder narrowing of the medial joint
compartment on the left. There is mild beaking of the medial tibial
spines. The lateral view of the left knee reveals a tiny spur
arising from the superior articular margin of the patella. There is
no joint effusion. There is no fracture nor dislocation.
IMPRESSION: There is mild osteoarthritic change of the left knee centered on the
medial joint compartment with minimal degenerative changes in the
patellofemoral compartment. Of note is moderate narrowing of the
medial joint compartment on the right.

## 2017-03-08 ENCOUNTER — Encounter (INDEPENDENT_AMBULATORY_CARE_PROVIDER_SITE_OTHER): Payer: Self-pay

## 2017-03-08 ENCOUNTER — Other Ambulatory Visit: Payer: Medicare HMO

## 2017-03-08 DIAGNOSIS — M25561 Pain in right knee: Secondary | ICD-10-CM

## 2017-03-08 DIAGNOSIS — E785 Hyperlipidemia, unspecified: Secondary | ICD-10-CM | POA: Diagnosis not present

## 2017-03-08 DIAGNOSIS — I10 Essential (primary) hypertension: Secondary | ICD-10-CM | POA: Diagnosis not present

## 2017-03-09 LAB — CMP14+EGFR
A/G RATIO: 2.4 — AB (ref 1.2–2.2)
ALBUMIN: 4.7 g/dL (ref 3.6–4.8)
ALT: 18 IU/L (ref 0–44)
AST: 16 IU/L (ref 0–40)
Alkaline Phosphatase: 39 IU/L (ref 39–117)
BILIRUBIN TOTAL: 0.9 mg/dL (ref 0.0–1.2)
BUN / CREAT RATIO: 13 (ref 10–24)
BUN: 15 mg/dL (ref 8–27)
CHLORIDE: 104 mmol/L (ref 96–106)
CO2: 26 mmol/L (ref 20–29)
Calcium: 9.8 mg/dL (ref 8.6–10.2)
Creatinine, Ser: 1.17 mg/dL (ref 0.76–1.27)
GFR calc non Af Amer: 65 mL/min/{1.73_m2} (ref >59)
GFR, EST AFRICAN AMERICAN: 75 mL/min/{1.73_m2} (ref >59)
Globulin, Total: 2 g/dL (ref 1.5–4.5)
Glucose: 64 mg/dL — ABNORMAL LOW (ref 65–99)
POTASSIUM: 3.9 mmol/L (ref 3.5–5.2)
SODIUM: 143 mmol/L (ref 134–144)
TOTAL PROTEIN: 6.7 g/dL (ref 6.0–8.5)

## 2017-03-09 LAB — LIPID PANEL
Chol/HDL Ratio: 4.4 ratio (ref 0.0–5.0)
Cholesterol, Total: 137 mg/dL (ref 100–199)
HDL: 31 mg/dL — ABNORMAL LOW (ref >39)
LDL Calculated: 78 mg/dL (ref 0–99)
Triglycerides: 139 mg/dL (ref 0–149)
VLDL Cholesterol Cal: 28 mg/dL (ref 5–40)

## 2017-03-11 ENCOUNTER — Other Ambulatory Visit: Payer: BLUE CROSS/BLUE SHIELD

## 2017-03-14 ENCOUNTER — Ambulatory Visit: Payer: Medicare HMO | Admitting: Nurse Practitioner

## 2017-03-14 ENCOUNTER — Encounter: Payer: Self-pay | Admitting: Nurse Practitioner

## 2017-03-14 VITALS — BP 131/84 | HR 68 | Temp 96.9°F | Ht 68.0 in | Wt 221.0 lb

## 2017-03-14 DIAGNOSIS — G63 Polyneuropathy in diseases classified elsewhere: Secondary | ICD-10-CM

## 2017-03-14 DIAGNOSIS — K219 Gastro-esophageal reflux disease without esophagitis: Secondary | ICD-10-CM

## 2017-03-14 DIAGNOSIS — M10072 Idiopathic gout, left ankle and foot: Secondary | ICD-10-CM

## 2017-03-14 DIAGNOSIS — M25561 Pain in right knee: Secondary | ICD-10-CM

## 2017-03-14 DIAGNOSIS — Z23 Encounter for immunization: Secondary | ICD-10-CM | POA: Diagnosis not present

## 2017-03-14 DIAGNOSIS — I10 Essential (primary) hypertension: Secondary | ICD-10-CM

## 2017-03-14 DIAGNOSIS — I6930 Unspecified sequelae of cerebral infarction: Secondary | ICD-10-CM | POA: Diagnosis not present

## 2017-03-14 DIAGNOSIS — G8929 Other chronic pain: Secondary | ICD-10-CM | POA: Diagnosis not present

## 2017-03-14 DIAGNOSIS — E785 Hyperlipidemia, unspecified: Secondary | ICD-10-CM | POA: Diagnosis not present

## 2017-03-14 DIAGNOSIS — Z6833 Body mass index (BMI) 33.0-33.9, adult: Secondary | ICD-10-CM | POA: Diagnosis not present

## 2017-03-14 MED ORDER — BUPIVACAINE HCL 0.25 % IJ SOLN
1.0000 mL | Freq: Once | INTRAMUSCULAR | Status: AC
  Administered 2017-03-14: 1 mL via INTRA_ARTICULAR

## 2017-03-14 MED ORDER — METHYLPREDNISOLONE ACETATE 40 MG/ML IJ SUSP
40.0000 mg | Freq: Once | INTRAMUSCULAR | Status: AC
  Administered 2017-03-14: 40 mg via INTRAMUSCULAR

## 2017-03-14 NOTE — Addendum Note (Signed)
Addended by: Rolena Infante on: 03/14/2017 12:49 PM   Modules accepted: Orders

## 2017-03-14 NOTE — Progress Notes (Signed)
Subjective:    Patient ID: David Garza, male    DOB: [birth year], 66 y.o.   MRN: [id number]  HPI  David Garza is here today for follow up of chronic medical problem.  Outpatient Encounter Medications as of 03/14/2017  Medication Sig  . amitriptyline (ELAVIL) 25 MG tablet Take 1 tablet (25 mg total) by mouth at bedtime.  . clopidogrel (PLAVIX) 75 MG tablet Take 1 tablet (75 mg total) by mouth daily.  . clopidogrel (PLAVIX) 75 MG tablet TAKE 1 TABLET BY MOUTH  DAILY  . fish oil-omega-3 fatty acids 1000 MG capsule Take 2 g by mouth daily.  Marland Kitchen lisinopril-hydrochlorothiazide (PRINZIDE,ZESTORETIC) 20-25 MG tablet Take 1 tablet by mouth daily.  Marland Kitchen omeprazole (PRILOSEC) 40 MG capsule Take 1 capsule (40 mg total) by mouth daily.  . pravastatin (PRAVACHOL) 80 MG tablet Take 1 tablet (80 mg total) by mouth daily.  . pregabalin (LYRICA) 75 MG capsule Take 1 capsule (75 mg total) by mouth 3 (three) times daily.   No facility-administered encounter medications on file as of 03/14/2017.     1. Essential hypertension  No c/o chest pain, sob or headache. Does not check blood pressures at home. BP Readings from Last 3 Encounters:  01/10/17 133/80  12/09/16 138/87  10/10/16 (!) 139/94     2. Gastroesophageal reflux disease without esophagitis  Takes omeprazole but not everyday. Works well when he needs it  3. Neuropathy due to medical condition [facility])  He has neuropathy in right hand and foot secondary to stroke. They told him it may inprove but he cannot tell a difference, he is on lyrica and elavil. He has not been taking elavil because he did not know if he could take with lyrica.  4. Hyperlipidemia, unspecified hyperlipidemia type  Wife really makes sure he watches his diet  5. Acute idiopathic gout of left foot  Has not had a flare up in the last 6 months  6. BMI 33.0-33.9,adult  No recent weight changes  7.      Late effect CVA          Has completed PT next door but he still  goes there 3 days a week to work out..           Still has some weakness on right side but he is able to full function and perform          ADL's without assistants.   New complaints: Right knee pain- has had for a couple of years- had injection in it about 7 months ago and really helped with pain.  [place] history[place]. Wife still will ot let him do a lot with his equipment because she is not sure he is capable of doing so without getting hurt.    Review of Systems  Constitutional: Negative for activity change and appetite change.  HENT: Negative.   Eyes: Negative for pain.  Respiratory: Negative for shortness of breath.   Cardiovascular: Negative for chest pain, palpitations and leg swelling.  Gastrointestinal: Negative for abdominal pain.  Endocrine: Negative for polydipsia.  Genitourinary: Negative.   Skin: Negative for rash.  Neurological: Positive for weakness (right side only) and numbness (some in right hand). Negative for dizziness and headaches.  Hematological: Does not bruise/bleed easily.  Psychiatric/Behavioral: Negative.   All other systems reviewed and are negative.      Objective:   Physical Exam  Constitutional: He is oriented to person, place, and time. He appears  well-developed and well-nourished.  HENT:  Head: Normocephalic.  Right Ear: External ear normal.  Left Ear: External ear normal.  Nose: Nose normal.  Mouth/Throat: Oropharynx is clear and moist.  Eyes: EOM are normal. Pupils are equal, round, and reactive to light.  Neck: Normal range of motion. Neck supple. No JVD present. No thyromegaly present.  Cardiovascular: Normal rate, regular rhythm, normal heart sounds and intact distal pulses. Exam reveals no gallop and no friction rub.  No murmur heard. Pulmonary/Chest: Effort normal and breath sounds normal. No respiratory distress. He has no wheezes. He has no rales. He exhibits no tenderness.  Abdominal: Soft. Bowel sounds are normal. He  exhibits no mass. There is no tenderness.  Musculoskeletal: Normal range of motion. He exhibits no edema.  Right sided hand and lower ext weakness when compared to left. Some numbness noted in right palm and tips of fingers. FROM of right knee with crepitus on full extension and flexion. Mild effusion. No patella tenderness  Lymphadenopathy:    He has no cervical adenopathy.  Neurological: He is alert and oriented to person, place, and time. No cranial nerve deficit.  Skin: Skin is warm and dry.  Psychiatric: He has a normal mood and affect. His behavior is normal. Judgment and thought content normal.   BP 131/84   Pulse 68   Temp (!) 96.9 F (36.1 C) (Oral)   Ht 5\' 8"  (1.727 m)   Wt 221 lb (100.2 kg)   BMI 33.60 kg/m     Procedure:  betadine prep to lateral side of right knee  Injected marcaine 0.25% plan 73ml with depomedrol 40mg  1 ml injected in right knee under sterile technique    Assessment & Plan:   1. Essential hypertension   2. Gastroesophageal reflux disease without esophagitis   3. Neuropathy due to medical condition ([place])   4. Hyperlipidemia, unspecified hyperlipidemia type   5. Acute idiopathic gout of left foot   6. BMI 33.0-33.9,adult   7. Late effect of cerebrovascular accident (CVA)   8. Chronic pain of right knee    Meds ordered this encounter  Medications  . methylPREDNISolone acetate (DEPO-MEDROL) injection 40 mg  . bupivacaine (MARCAINE) 0.25 % (with pres) injection 1 mL   Right knee injection.Rest right knee- wear ace wrap when up walking today- ice bid Diet discussed Continue exercises at rehab Labs reviewed at appointment RTO in 3 months follow up  [place], FNP

## 2017-03-14 NOTE — Patient Instructions (Signed)
Knee Injection, Care After  Refer to this sheet in the next few weeks. These instructions provide you with information about caring for yourself after your procedure. Your health care provider may also give you more specific instructions. Your treatment has been planned according to current medical practices, but problems sometimes occur. Call your health care provider if you have any problems or questions after your procedure.  What can I expect after the procedure?  After the procedure, it is common to have:   Soreness.   Warmth.   Swelling.    You may have more pain, swelling, and warmth than you did before the injection. This reaction may last for about one day.  Follow these instructions at home:  Bathing   If you were given a bandage (dressing), keep it dry until your health care provider says it can be removed. Ask your health care provider when you can start showering or taking a bath.  Managing pain, stiffness, and swelling   If directed, apply ice to the injection area:  ? Put ice in a plastic bag.  ? Place a towel between your skin and the bag.  ? Leave the ice on for 20 minutes, 2-3 times per day.   Do not apply heat to your knee.   Raise the injection area above the level of your heart while you are sitting or lying down.  Activity   Avoid strenuous activities for as long as directed by your health care provider. Ask your health care provider when you can return to your normal activities.  General instructions   Take medicines only as directed by your health care provider.   Do not take aspirin or other over-the-counter medicines unless your health care provider says you can.   Check your injection site every day for signs of infection. Watch for:  ? Redness, swelling, or pain.  ? Fluid, blood, or pus.   Follow your health care provider's instructions about dressing changes and removal.  Contact a health care provider if:   You have symptoms at your injection site that last longer than  two days after your procedure.   You have redness, swelling, or pain in your injection area.   You have fluid, blood, or pus coming from your injection site.   You have warmth in your injection area.   You have a fever.   Your pain is not controlled with medicine.  Get help right away if:   Your knee turns very red.   Your knee becomes very swollen.   Your knee pain is severe.  This information is not intended to replace advice given to you by your health care provider. Make sure you discuss any questions you have with your health care provider.  Document Released: 05/09/2014 Document Revised: 12/23/2015 Document Reviewed: 02/26/2014  Elsevier Interactive Patient Education  2018 [employer] Inc[employer]

## 2017-06-06 ENCOUNTER — Other Ambulatory Visit: Payer: Self-pay

## 2017-06-06 DIAGNOSIS — I10 Essential (primary) hypertension: Secondary | ICD-10-CM

## 2017-06-06 MED ORDER — LISINOPRIL-HYDROCHLOROTHIAZIDE 20-25 MG PO TABS
1.0000 | ORAL_TABLET | Freq: Every day | ORAL | 1 refills | Status: DC
Start: 2017-06-06 — End: 2017-09-12

## 2017-06-12 ENCOUNTER — Other Ambulatory Visit: Payer: Self-pay | Admitting: *Deleted

## 2017-06-12 ENCOUNTER — Other Ambulatory Visit: Payer: Medicare HMO

## 2017-06-12 DIAGNOSIS — E782 Mixed hyperlipidemia: Secondary | ICD-10-CM

## 2017-06-12 DIAGNOSIS — I10 Essential (primary) hypertension: Secondary | ICD-10-CM

## 2017-06-12 DIAGNOSIS — Z125 Encounter for screening for malignant neoplasm of prostate: Secondary | ICD-10-CM

## 2017-06-12 MED ORDER — PRAVASTATIN SODIUM 80 MG PO TABS: 80.0000 mg | ORAL_TABLET | Freq: Every day | ORAL | 0 refills | Status: DC

## 2017-06-12 NOTE — Telephone Encounter (Signed)
OV 06/15/17

## 2017-06-13 LAB — CMP14+EGFR
A/G RATIO: 1.7 (ref 1.2–2.2)
ALBUMIN: 4.5 g/dL (ref 3.6–4.8)
ALK PHOS: 44 IU/L (ref 39–117)
ALT: 27 IU/L (ref 0–44)
AST: 17 IU/L (ref 0–40)
BILIRUBIN TOTAL: 0.7 mg/dL (ref 0.0–1.2)
BUN / CREAT RATIO: 12 (ref 10–24)
BUN: 14 mg/dL (ref 8–27)
CHLORIDE: 101 mmol/L (ref 96–106)
CO2: 24 mmol/L (ref 20–29)
Calcium: 9.8 mg/dL (ref 8.6–10.2)
Creatinine, Ser: 1.17 mg/dL (ref 0.76–1.27)
GFR calc non Af Amer: 65 mL/min/{1.73_m2} (ref >59)
GFR, EST AFRICAN AMERICAN: 75 mL/min/{1.73_m2} (ref >59)
Globulin, Total: 2.6 g/dL (ref 1.5–4.5)
Glucose: 91 mg/dL (ref 65–99)
POTASSIUM: 4 mmol/L (ref 3.5–5.2)
SODIUM: 142 mmol/L (ref 134–144)
TOTAL PROTEIN: 7.1 g/dL (ref 6.0–8.5)

## 2017-06-13 LAB — LIPID PANEL
Chol/HDL Ratio: 5.1 ratio — ABNORMAL HIGH (ref 0.0–5.0)
Cholesterol, Total: 137 mg/dL (ref 100–199)
HDL: 27 mg/dL — ABNORMAL LOW (ref >39)
LDL Calculated: 78 mg/dL (ref 0–99)
Triglycerides: 158 mg/dL — ABNORMAL HIGH (ref 0–149)
VLDL Cholesterol Cal: 32 mg/dL (ref 5–40)

## 2017-06-13 LAB — PSA, TOTAL AND FREE
PROSTATE SPECIFIC AG, SERUM: 0.6 ng/mL (ref 0.0–4.0)
PSA FREE PCT: 60 %
PSA FREE: 0.36 ng/mL

## 2017-06-15 ENCOUNTER — Encounter: Payer: Self-pay | Admitting: Nurse Practitioner

## 2017-06-15 ENCOUNTER — Ambulatory Visit (INDEPENDENT_AMBULATORY_CARE_PROVIDER_SITE_OTHER): Payer: Medicare HMO | Admitting: Nurse Practitioner

## 2017-06-15 VITALS — BP 139/87 | HR 74 | Temp 97.0°F | Ht 68.0 in | Wt 216.0 lb

## 2017-06-15 DIAGNOSIS — G63 Polyneuropathy in diseases classified elsewhere: Secondary | ICD-10-CM | POA: Diagnosis not present

## 2017-06-15 DIAGNOSIS — K219 Gastro-esophageal reflux disease without esophagitis: Secondary | ICD-10-CM

## 2017-06-15 DIAGNOSIS — I10 Essential (primary) hypertension: Secondary | ICD-10-CM

## 2017-06-15 DIAGNOSIS — M10072 Idiopathic gout, left ankle and foot: Secondary | ICD-10-CM | POA: Diagnosis not present

## 2017-06-15 DIAGNOSIS — I6930 Unspecified sequelae of cerebral infarction: Secondary | ICD-10-CM

## 2017-06-15 DIAGNOSIS — E782 Mixed hyperlipidemia: Secondary | ICD-10-CM | POA: Diagnosis not present

## 2017-06-15 DIAGNOSIS — Z6833 Body mass index (BMI) 33.0-33.9, adult: Secondary | ICD-10-CM | POA: Diagnosis not present

## 2017-06-15 MED ORDER — DULOXETINE HCL 60 MG PO CPEP: 60.0000 mg | ORAL_CAPSULE | Freq: Every day | ORAL | 5 refills | Status: DC

## 2017-06-15 MED ORDER — OMEPRAZOLE 40 MG PO CPDR: 40.0000 mg | DELAYED_RELEASE_CAPSULE | Freq: Every day | ORAL | 1 refills | Status: DC

## 2017-06-15 MED ORDER — CLOPIDOGREL BISULFATE 75 MG PO TABS: 75.0000 mg | ORAL_TABLET | Freq: Every day | ORAL | 1 refills | Status: DC

## 2017-06-15 MED ORDER — PRAVASTATIN SODIUM 80 MG PO TABS: 80.0000 mg | ORAL_TABLET | Freq: Every day | ORAL | 1 refills | Status: DC

## 2017-06-15 NOTE — Progress Notes (Signed)
Subjective:    Patient ID: David Garza, male    DOB: [birth year], 67 y.o.   MRN: [id number]  HPI   David Garza is here today for follow up of chronic medical problem.  Outpatient Encounter Medications as of 06/15/2017  Medication Sig  . amitriptyline (ELAVIL) 25 MG tablet Take 1 tablet (25 mg total) by mouth at bedtime.  . clopidogrel (PLAVIX) 75 MG tablet Take 1 tablet (75 mg total) by mouth daily.  . clopidogrel (PLAVIX) 75 MG tablet TAKE 1 TABLET BY MOUTH  DAILY  . fish oil-omega-3 fatty acids 1000 MG capsule Take 2 g by mouth daily.  Marland Kitchen lisinopril-hydrochlorothiazide (PRINZIDE,ZESTORETIC) 20-25 MG tablet Take 1 tablet by mouth daily.  Marland Kitchen omeprazole (PRILOSEC) 40 MG capsule Take 1 capsule (40 mg total) by mouth daily.  . pravastatin (PRAVACHOL) 80 MG tablet Take 1 tablet (80 mg total) by mouth daily.  . pregabalin (LYRICA) 75 MG capsule Take 1 capsule (75 mg total) by mouth 3 (three) times daily.     1. Essential hypertension  No c/o chest pain, sob or headache. Wife checks his blood pressure at home and it is running in the 140's. Wife said neurology wants blood pressure a little high for brain perfusion. BP Readings from Last 3 Encounters:  03/14/17 131/84  01/10/17 133/80  12/09/16 138/87     2. Gastroesophageal reflux disease without esophagitis  Currently on omperazole daily- works well to keep symptoms under control.  3. Neuropathy due to medical condition ([place])  Has bad neuropathy in right hand and feet form stroke. He is on gabapentin which makes sensation tolerable. He is also on elavil but he says that that makes him so sleepy.  4. BMI 33.0-33.9,adult  Discussed diet and exercise for person with BMI >25 Will recheck weight in 3-6 months  5. Acute idiopathic gout of left foot  Has not had any recent flare ups  6. Hyperlipidemia, unspecified hyperlipidemia type  Wife tries to not serve him a lot of fried foods    New complaints: None today  Social  history: Lives [place] a farm and is very active.      Review of Systems  Constitutional: Negative for activity change and appetite change.  HENT: Negative.   Eyes: Negative for pain.  Respiratory: Negative for shortness of breath.   Cardiovascular: Negative for chest pain, palpitations and leg swelling.  Gastrointestinal: Negative for abdominal pain.  Endocrine: Negative for polydipsia.  Genitourinary: Negative.   Skin: Negative for rash.  Neurological: Negative for dizziness, weakness and headaches.  Hematological: Does not bruise/bleed easily.  Psychiatric/Behavioral: Negative.   All other systems reviewed and are negative.      Objective:   Physical Exam  Constitutional: He is oriented to person, place, and time. He appears well-developed and well-nourished.  HENT:  Head: Normocephalic.  Right Ear: External ear normal.  Left Ear: External ear normal.  Nose: Nose normal.  Mouth/Throat: Oropharynx is clear and moist.  Eyes: EOM are normal. Pupils are equal, round, and reactive to light.  Neck: Normal range of motion. Neck supple. No JVD present. No thyromegaly present.  Cardiovascular: Normal rate, regular rhythm, normal heart sounds and intact distal pulses. Exam reveals no gallop and no friction rub.  No murmur heard. Pulmonary/Chest: Effort normal and breath sounds normal. No respiratory distress. He has no wheezes. He has no rales. He exhibits no tenderness.  Abdominal: Soft. Bowel sounds are normal. He exhibits no mass. There is no tenderness.  Genitourinary: Prostate normal and penis normal.  Musculoskeletal: Normal range of motion. He exhibits no edema.  Lymphadenopathy:    He has no cervical adenopathy.  Neurological: He is alert and oriented to person, place, and time. No cranial nerve deficit.  Skin: Skin is warm and dry.  Psychiatric: He has a normal mood and affect. His behavior is normal. Judgment and thought content normal.    BP 139/87   Pulse 74   Temp  (!) 97 F (36.1 C) (Oral)   Ht 5\' 8"  (1.727 m)   Wt 216 lb (98 kg)   BMI 32.84 kg/m        Assessment & Plan:  1. Essential hypertension Low sodium diet  2. Gastroesophageal reflux disease without esophagitis Avoid spicy foods Do not eat 2 hours prior to bedtime - omeprazole (PRILOSEC) 40 MG capsule; Take 1 capsule (40 mg total) by mouth daily.  Dispense: 90 capsule; Refill: 1  3. Neuropathy due to medical condition ([place]) Stop elavil cymbalta may cause nausea when first start taking - DULoxetine (CYMBALTA) 60 MG capsule; Take 1 capsule (60 mg total) by mouth daily.  Dispense: 30 capsule; Refill: 5  4. BMI 33.0-33.9,adult Discussed diet and exercise for person with BMI >25 Will recheck weight in 3-6 months   5. Acute idiopathic gout of left foot Low purine diet  6.Mixed hyperlipidemia - pravastatin (PRAVACHOL) 80 MG tablet; Take 1 tablet (80 mg total) by mouth daily.  Dispense: 90 tablet; Refill: 1 Low fat diet  7. Late effect of cerebrovascular accident (CVA) - clopidogrel (PLAVIX) 75 MG tablet; Take 1 tablet (75 mg total) by mouth daily.  Dispense: 90 tablet; Refill: 1    Labs pending Health maintenance reviewed Diet and exercise encouraged Continue all meds Follow up  In 3 months   [place], FNP

## 2017-06-15 NOTE — Patient Instructions (Signed)

## 2017-06-20 DIAGNOSIS — D1801 Hemangioma of skin and subcutaneous tissue: Secondary | ICD-10-CM | POA: Diagnosis not present

## 2017-06-20 DIAGNOSIS — D2262 Melanocytic nevi of left upper limb, including shoulder: Secondary | ICD-10-CM | POA: Diagnosis not present

## 2017-06-20 DIAGNOSIS — Z85828 Personal history of other malignant neoplasm of skin: Secondary | ICD-10-CM | POA: Diagnosis not present

## 2017-06-20 DIAGNOSIS — L821 Other seborrheic keratosis: Secondary | ICD-10-CM | POA: Diagnosis not present

## 2017-06-20 DIAGNOSIS — L570 Actinic keratosis: Secondary | ICD-10-CM | POA: Diagnosis not present

## 2017-07-03 ENCOUNTER — Other Ambulatory Visit: Payer: Self-pay | Admitting: Nurse Practitioner

## 2017-07-03 MED ORDER — GABAPENTIN 100 MG PO CAPS: 100.0000 mg | ORAL_CAPSULE | Freq: Three times a day (TID) | ORAL | 3 refills | Status: DC

## 2017-07-03 NOTE — Telephone Encounter (Signed)
Patient needs refill on his Gabapentin.  Medication is not listed on his med list but is mentioned in your notes from his visit in February.  Please advise.

## 2017-07-03 NOTE — Telephone Encounter (Signed)
Needs to be called into [facility] in [place]. New med has not started helping yet. He was told to stay on gabapentin until the new started helping him.

## 2017-09-06 ENCOUNTER — Other Ambulatory Visit: Payer: Medicare HMO

## 2017-09-06 DIAGNOSIS — E785 Hyperlipidemia, unspecified: Secondary | ICD-10-CM

## 2017-09-06 DIAGNOSIS — I10 Essential (primary) hypertension: Secondary | ICD-10-CM

## 2017-09-07 LAB — CMP14+EGFR
ALBUMIN: 4.5 g/dL (ref 3.6–4.8)
ALT: 20 IU/L (ref 0–44)
AST: 16 IU/L (ref 0–40)
Albumin/Globulin Ratio: 2 (ref 1.2–2.2)
Alkaline Phosphatase: 42 IU/L (ref 39–117)
BUN/Creatinine Ratio: 10 (ref 10–24)
BUN: 11 mg/dL (ref 8–27)
Bilirubin Total: 0.5 mg/dL (ref 0.0–1.2)
CO2: 22 mmol/L (ref 20–29)
CREATININE: 1.11 mg/dL (ref 0.76–1.27)
Calcium: 9.7 mg/dL (ref 8.6–10.2)
Chloride: 101 mmol/L (ref 96–106)
GFR calc Af Amer: 80 mL/min/{1.73_m2} (ref >59)
GFR, EST NON AFRICAN AMERICAN: 69 mL/min/{1.73_m2} (ref >59)
GLOBULIN, TOTAL: 2.3 g/dL (ref 1.5–4.5)
Glucose: 87 mg/dL (ref 65–99)
POTASSIUM: 4.2 mmol/L (ref 3.5–5.2)
Sodium: 140 mmol/L (ref 134–144)
TOTAL PROTEIN: 6.8 g/dL (ref 6.0–8.5)

## 2017-09-07 LAB — LIPID PANEL
Chol/HDL Ratio: 4.4 ratio (ref 0.0–5.0)
Cholesterol, Total: 150 mg/dL (ref 100–199)
HDL: 34 mg/dL — AB (ref >39)
LDL CALC: 95 mg/dL (ref 0–99)
TRIGLYCERIDES: 107 mg/dL (ref 0–149)
VLDL CHOLESTEROL CAL: 21 mg/dL (ref 5–40)

## 2017-09-12 ENCOUNTER — Ambulatory Visit (INDEPENDENT_AMBULATORY_CARE_PROVIDER_SITE_OTHER): Payer: Medicare HMO | Admitting: Nurse Practitioner

## 2017-09-12 ENCOUNTER — Encounter: Payer: Self-pay | Admitting: Nurse Practitioner

## 2017-09-12 VITALS — BP 124/84 | HR 83 | Temp 97.0°F | Ht 68.0 in | Wt 213.0 lb

## 2017-09-12 DIAGNOSIS — E782 Mixed hyperlipidemia: Secondary | ICD-10-CM

## 2017-09-12 DIAGNOSIS — I6930 Unspecified sequelae of cerebral infarction: Secondary | ICD-10-CM

## 2017-09-12 DIAGNOSIS — G63 Polyneuropathy in diseases classified elsewhere: Secondary | ICD-10-CM | POA: Diagnosis not present

## 2017-09-12 DIAGNOSIS — K219 Gastro-esophageal reflux disease without esophagitis: Secondary | ICD-10-CM

## 2017-09-12 DIAGNOSIS — M1A0710 Idiopathic chronic gout, right ankle and foot, without tophus (tophi): Secondary | ICD-10-CM | POA: Diagnosis not present

## 2017-09-12 DIAGNOSIS — Z6833 Body mass index (BMI) 33.0-33.9, adult: Secondary | ICD-10-CM

## 2017-09-12 DIAGNOSIS — I10 Essential (primary) hypertension: Secondary | ICD-10-CM

## 2017-09-12 DIAGNOSIS — M25511 Pain in right shoulder: Secondary | ICD-10-CM

## 2017-09-12 MED ORDER — LISINOPRIL-HYDROCHLOROTHIAZIDE 20-25 MG PO TABS: 1.0000 | ORAL_TABLET | Freq: Every day | ORAL | 1 refills | Status: DC

## 2017-09-12 MED ORDER — PRAVASTATIN SODIUM 80 MG PO TABS: 80.0000 mg | ORAL_TABLET | Freq: Every day | ORAL | 1 refills | Status: DC

## 2017-09-12 MED ORDER — CLOPIDOGREL BISULFATE 75 MG PO TABS: 75.0000 mg | ORAL_TABLET | Freq: Every day | ORAL | 1 refills | Status: DC

## 2017-09-12 MED ORDER — GABAPENTIN 100 MG PO CAPS: 100.0000 mg | ORAL_CAPSULE | Freq: Three times a day (TID) | ORAL | 3 refills | Status: DC

## 2017-09-12 MED ORDER — DICLOFENAC SODIUM 1 % TD GEL: 2.0000 g | Freq: Four times a day (QID) | TRANSDERMAL | 3 refills | Status: DC

## 2017-09-12 NOTE — Progress Notes (Addendum)
Subjective:    Patient ID: David Garza, male    DOB: 11-Sep-1950, 67 y.o.   MRN: [id number]   Chief Complaint: No chief complaint on file.   HPI:  1. Essential hypertension  No c/o chest pain, sob or headache. Does not check blood pressure at home. BP Readings from Last 3 Encounters:  09/12/17 124/84  06/15/17 139/87  03/14/17 131/84     2. Hyperlipidemia, unspecified hyperlipidemia type  Wife tries to not cook a lot of fried foods. Patient tries to stay very active.  3. Gastroesophageal reflux disease without esophagitis  On omerpazole daily. Says that if he does not take then he gets heart burn symptoms.  4. Neuropathy due to medical condition [facility])  Patient had a stroke over a year ago and he has lots of numbness and tingling in right hand. He takes neurotin which he says helps. Not sure that cymbalta is helping  5.  gout  No recent flare ups  6. BMI 33.0-33.9,adult  Weight down 3 lbs    Outpatient Encounter Medications as of 09/12/2017  Medication Sig  . clopidogrel (PLAVIX) 75 MG tablet Take 1 tablet (75 mg total) by mouth daily.  . DULoxetine (CYMBALTA) 60 MG capsule Take 1 capsule (60 mg total) by mouth daily.  . fish oil-omega-3 fatty acids 1000 MG capsule Take 2 g by mouth daily.  Marland Kitchen gabapentin (NEURONTIN) 100 MG capsule Take 1 capsule (100 mg total) by mouth 3 (three) times daily.  Marland Kitchen lisinopril-hydrochlorothiazide (PRINZIDE,ZESTORETIC) 20-25 MG tablet Take 1 tablet by mouth daily.  Marland Kitchen omeprazole (PRILOSEC) 40 MG capsule Take 1 capsule (40 mg total) by mouth daily.  . pravastatin (PRAVACHOL) 80 MG tablet Take 1 tablet (80 mg total) by mouth daily.     New complaints: Having right shoulder and arm pain. Has ld injury and just recently started hurting him.  Social history: Lives with his wife on farm.    Review of Systems  Constitutional: Negative for activity change and appetite change.  HENT: Negative.   Eyes: Negative for pain.  Respiratory: Negative  for shortness of breath.   Cardiovascular: Negative for chest pain, palpitations and leg swelling.  Gastrointestinal: Negative for abdominal pain.  Endocrine: Negative for polydipsia.  Genitourinary: Negative.   Skin: Negative for rash.  Neurological: Positive for numbness (right hand). Negative for dizziness, weakness and headaches.  Hematological: Does not bruise/bleed easily.  Psychiatric/Behavioral: Negative.   All other systems reviewed and are negative.      Objective:   Physical Exam  Constitutional: He is oriented to person, place, and time.  HENT:  Head: Normocephalic.  Nose: Nose normal.  Mouth/Throat: Oropharynx is clear and moist.  Eyes: Pupils are equal, round, and reactive to light. EOM are normal.  Neck: Normal range of motion and phonation normal. Neck supple. No JVD present. Carotid bruit is not present. No thyroid mass and no thyromegaly present.  Cardiovascular: Normal rate and regular rhythm.  Pulmonary/Chest: Effort normal and breath sounds normal. No respiratory distress.  Abdominal: Soft. Normal appearance, normal aorta and bowel sounds are normal. There is no tenderness.  Musculoskeletal: Normal range of motion.  Right grip weaker then left Negative monofilament right hand Decrease ROM of right shoulder due to pain on full abduction  Lymphadenopathy:    He has no cervical adenopathy.  Neurological: He is alert and oriented to person, place, and time.  Skin: Skin is warm and dry.  Psychiatric: Judgment normal.   BP 124/84   Pulse  83   Temp (!) 97 F (36.1 C) (Oral)   Ht 5\' 8"  (1.727 m)   Wt 213 lb (96.6 kg)   BMI 32.39 kg/m         Assessment & Plan:  David Garza comes in today with chief complaint of No chief complaint on file.   Diagnosis and orders addressed:  1. Essential hypertension Low sodium diet - lisinopril-hydrochlorothiazide (PRINZIDE,ZESTORETIC) 20-25 MG tablet; Take 1 tablet by mouth daily.  Dispense: 90 tablet;  Refill: 1  2. Gastroesophageal reflux disease without esophagitis Avoid spicy foods Do not eat 2 hours prior to bedtime  3. Neuropathy due to medical condition ([place]) cntinue gabapentin as rx Is going to wean off of cymbalta - gabapentin (NEURONTIN) 100 MG capsule; Take 1 capsule (100 mg total) by mouth 3 (three) times daily.  Dispense: 90 capsule; Refill: 3  4. Idiopathic chronic gout of right foot without tophus Low purine diet  5. BMI 33.0-33.9,adult Discussed diet and exercise for person with BMI >25 Will recheck weight in 3-6 months  6. Late effect of cerebrovascular accident (CVA) - clopidogrel (PLAVIX) 75 MG tablet; Take 1 tablet (75 mg total) by mouth daily.  Dispense: 90 tablet; Refill: 1  7. Mixed hyperlipidemia Low fat diet - pravastatin (PRAVACHOL) 80 MG tablet; Take 1 tablet (80 mg total) by mouth daily.  Dispense: 90 tablet; Refill: 1  8. Acute pain of right shoulder Rest If does not improve willl need ortho referral - diclofenac sodium (VOLTAREN) 1 % GEL; Apply 2 g topically 4 (four) times daily.  Dispense: 500 g; Refill: 3   Labs reviewed at appointment Health Maintenance reviewed Diet and exercise encouraged  Follow up plan: 3 months   [place], FNP

## 2017-09-12 NOTE — Patient Instructions (Signed)
Shoulder Pain Many things can cause shoulder pain, including:  An injury.  Moving the arm in the same way again and again (overuse).  Joint pain (arthritis).  Follow these instructions at home: Take these actions to help with your pain:  Squeeze a soft ball or a foam pad as much as you can. This helps to prevent swelling. It also makes the arm stronger.  Take over-the-counter and prescription medicines only as told by your doctor.  If told, put ice on the area: ? Put ice in a plastic bag. ? Place a towel between your skin and the bag. ? Leave the ice on for 20 minutes, 2-3 times per day. Stop putting on ice if it does not help with the pain.  If you were given a shoulder sling or immobilizer: ? Wear it as told. ? Remove it to shower or bathe. ? Move your arm as little as possible. ? Keep your hand moving. This helps prevent swelling.  Contact a doctor if:  Your pain gets worse.  Medicine does not help your pain.  You have new pain in your arm, hand, or fingers. Get help right away if:  Your arm, hand, or fingers: ? Tingle. ? Are numb. ? Are swollen. ? Are painful. ? Turn white or blue. This information is not intended to replace advice given to you by your health care provider. Make sure you discuss any questions you have with your health care provider. Document Released: 10/05/2007 Document Revised: 12/13/2015 Document Reviewed: 08/11/2014 Elsevier Interactive Patient Education  2018 [employer]  

## 2017-09-13 ENCOUNTER — Encounter: Payer: Self-pay | Admitting: *Deleted

## 2017-09-13 ENCOUNTER — Ambulatory Visit (INDEPENDENT_AMBULATORY_CARE_PROVIDER_SITE_OTHER): Payer: Medicare HMO | Admitting: *Deleted

## 2017-09-13 VITALS — BP 96/58 | HR 93 | Ht 68.0 in | Wt 214.0 lb

## 2017-09-13 DIAGNOSIS — Z0000 Encounter for general adult medical examination without abnormal findings: Secondary | ICD-10-CM

## 2017-09-13 NOTE — Progress Notes (Addendum)
Subjective:   David Garza is a 67 y.o. male who presents for a Medicare Annual Wellness Visit. David Garza lives at home with his wife. He has one adult daughter. He is retired but continues to farm.   Review of Systems    Patient reports that his health is better compared to last year. He had a stroke last year but is improving.   Cardiac Risk Factors include: advanced age (>62men, >49 women);dyslipidemia;hypertension;male gender;obesity (BMI >30kg/m2)  Other systems negative unless otherwise noted in other area       Current Medications (verified) Outpatient Encounter Medications as of 09/13/2017  Medication Sig  . clopidogrel (PLAVIX) 75 MG tablet Take 1 tablet (75 mg total) by mouth daily.  . diclofenac sodium (VOLTAREN) 1 % GEL Apply 2 g topically 4 (four) times daily.  . DULoxetine (CYMBALTA) 60 MG capsule Take 1 capsule (60 mg total) by mouth daily.  . fish oil-omega-3 fatty acids 1000 MG capsule Take 2 g by mouth daily.  Marland Kitchen gabapentin (NEURONTIN) 100 MG capsule Take 1 capsule (100 mg total) by mouth 3 (three) times daily.  Marland Kitchen lisinopril-hydrochlorothiazide (PRINZIDE,ZESTORETIC) 20-25 MG tablet Take 1 tablet by mouth daily.  Marland Kitchen omeprazole (PRILOSEC) 40 MG capsule Take 1 capsule (40 mg total) by mouth daily.  . pravastatin (PRAVACHOL) 80 MG tablet Take 1 tablet (80 mg total) by mouth daily.   No facility-administered encounter medications on file as of 09/13/2017.     Allergies (verified) Meloxicam and Nsaids   History: Past Medical History:  Diagnosis Date  . Hyperlipidemia   . Hypertension   . Metabolic syndrome   . Stroke [facility]) 06/2016   Past Surgical History:  Procedure Laterality Date  . KNEE ARTHROSCOPY Right   . SHOULDER SURGERY Right    Family History  Problem Relation Age of Onset  . Cancer Mother        pancreatic  . Alzheimer's disease Mother 56  . Diabetes Sister   . Coronary artery disease Maternal Grandfather   . Early death Father     . Alzheimer's disease Paternal Uncle 77  . Aneurysm Paternal Uncle    Social History   Socioeconomic History  . Marital status: Married    Spouse name: Not on file  . Number of children: 1  . Years of education: 32  . Highest education level: High school graduate  Occupational History  . Occupation: Retired    Comment: continues to farm   Social Needs  . Financial resource strain: Not hard at all  . Food insecurity:    Worry: Never true    Inability: Never true  . Transportation needs:    Medical: No    Non-medical: No  Tobacco Use  . Smoking status: Former Research scientist (life sciences)  . Smokeless tobacco: Never Used  Substance and Sexual Activity  . Alcohol use: No  . Drug use: No  . Sexual activity: Not on file  Lifestyle  . Physical activity:    Days per week: 7 days    Minutes per session: 60 min  . Stress: Not on file  Relationships  . Social connections:    Talks on phone: More than three times a week    Gets together: More than three times a week    Attends religious service: More than 4 times per year    Active member of club or organization: No    Attends meetings of clubs or organizations: Never    Relationship status: Married  Other Topics Concern  . Not on file  Social History Narrative  . Not on file    Tobacco Counseling Counseling given: Not Answered   Clinical Intake:     Pain : 0-10 Pain Score: 3  Pain Type: Chronic pain Pain Location: Shoulder Pain Orientation: Right Pain Descriptors / Indicators: Aching Pain Onset: More than a month ago Pain Frequency: Constant Effect of Pain on Daily Activities: Minimal-pushes through     Nutritional Status: BMI > 30  Obese Diabetes: No  How often do you need to have someone help you when you read instructions, pamphlets, or other written materials from your doctor or pharmacy?: 1 - Never  Interpreter Needed?: No  Information entered by :: Chong Sicilian, RN  Activities of Daily Living In your present state  of health, do you have any difficulty performing the following activities: 09/13/2017  Hearing? N  Vision? N  Comment recent eye exam  Difficulty concentrating or making decisions? N  Walking or climbing stairs? N  Dressing or bathing? N  Doing errands, shopping? N  Preparing Food and eating ? N  Using the Toilet? N  In the past six months, have you accidently leaked urine? N  Do you have problems with loss of bowel control? N  Managing your Medications? N  Managing your Finances? N  Housekeeping or managing your Housekeeping? N  Some recent data might be hidden     Immunizations and Health Maintenance Immunization History  Administered Date(s) Administered  . Influenza,inj,Quad PF,6+ Mos 03/19/2013, 02/03/2014, 01/29/2015, 02/03/2017  . Influenza-Unspecified 02/11/2016  . Pneumococcal Conjugate-13 03/14/2017  . Tdap 09/17/2012  . Zoster 05/16/2014   There are no preventive care reminders to display for this patient.  Diet 3 meals a day. Eats mainly at home. Does each large portions.  Has snacks  Exercise Current Exercise Habits: Home exercise routine, Type of exercise: stretching;walking;strength training/weights, Time (Minutes): 60, Frequency (Times/Week): 7, Weekly Exercise (Minutes/Week): 420, Intensity: Moderate    Depression Screen PHQ 2/9 Scores 09/12/2017 06/15/2017 03/14/2017 01/10/2017  PHQ - 2 Score 0 0 0 0     Fall Risk Fall Risk  09/12/2017 06/15/2017 03/14/2017 01/10/2017 12/09/2016  Falls in the past year? No No No No No    Safety Is the patient's home free of loose throw rugs in walkways, pet beds, electrical cords, etc?   yes      Grab bars in the bathroom? no      Walkin shower? no      Shower Seat? no      Handrails on the stairs?   yes      Adequate lighting?   yes  Patient Care Team: [name], FNP as PCP - General (Nurse Practitioner)  No hospitalizations, ER visits, or surgeries this past year.  Objective:    Today's Vitals    09/13/17 1113 09/13/17 1126  BP: (!) 96/58   Pulse: 93   Weight: 214 lb (97.1 kg)   Height: 5\' 8"  (1.727 m)   PainSc:  3    Body mass index is 32.54 kg/m.  Advanced Directives 05/16/2014  Does Patient Have a Medical Advance Directive? No  Would patient like information on creating a medical advance directive? Yes - Educational materials given    Hearing/Vision  normal or No deficits noted during visit.   Cognitive Function: MMSE - Mini Mental State Exam 09/13/2017  Orientation to time 5  Orientation to Place 5  Registration 3  Attention/ Calculation 5  Recall 2  Language- name 2 objects 2  Language- repeat 1  Language- follow 3 step command 3  Language- read & follow direction 1  Write a sentence 1  Copy design 1  Total score 29       Normal Cognitive Function Screening: Yes      Assessment:   This is a routine wellness examination for David Garza.      Plan:    Keep f/u with [name], FNP and any other specialty appointments you may have Continue current medications Move carefully to avoid falls. Use assistive devices like a can or walker if needed. Aim for at least 150 minutes of moderate activity a week. This can be chair exercises if necessary. Reading or puzzles are a good way to exercise your brain Stay connected with friends and family. Social connections are beneficial to your emotional and mental health.   Review and return a signed, witnessed, and notarized copy of Advance Directives if given.  Health Maintenance: Tdap Vaccine recommended: no Zostavax (Shingles vaccine) recommended:No Prevnar or Pneumovax (pneumonia vaccines) recommended:yes  Cancer Screenings: Lung: Low Dose CT Chest recommended if Age 27-80 years, 30 pack-year currently smoking OR have quit w/in 15years. Patient does not qualify. Colon cancer screening recommended: no  Additional Screenings Hepatitis C Screening recommended: no Dexa Scan recommended: no Diabetic Eye  Exam recommended: not applicable  No orders of the defined types were placed in this encounter.   I have personally reviewed and noted the following in the patient's chart:   . Medical and social history . Use of alcohol, tobacco or illicit drugs  . Current medications and supplements . Functional ability and status . Nutritional status . Physical activity . Advanced directives . List of other physicians . Hospitalizations, surgeries, and ER visits in previous 12 months . Vitals . Screenings to include cognitive, depression, and falls . Referrals and appointments  In addition, I have reviewed and discussed with patient certain preventive protocols, quality metrics, and best practice recommendations. A written personalized care plan for preventive services as well as general preventive health recommendations were provided to patient.     Chong Sicilian, RN   09/13/17   I have reviewed and agree with the above AWV documentation.   Mary-Margaret Hassell Done, FNP

## 2017-09-13 NOTE — Patient Instructions (Signed)
  David Garza , Thank you for taking time to come for your Medicare Wellness Visit. I appreciate your ongoing commitment to your health goals. Please review the following plan we discussed and let me know if I can assist you in the future.   These are the goals we discussed: Decrease portion sizes to help control weight Continue to stay active. Aim for 150 minutes a week.   This is a list of the screening recommended for you and due dates:  Health Maintenance  Topic Date Due  . HIV Screening  12/09/2017*  . Flu Shot  11/30/2017  . Pneumonia vaccines (2 of 2 - PPSV23) 03/14/2018  . Tetanus Vaccine  09/18/2022  . Colon Cancer Screening  05/06/2023  .  Hepatitis C: One time screening is recommended by Center for Disease Control  (CDC) for  adults born from [birth year] through 1964.   Completed  *Topic was postponed. The date shown is not the original due date.

## 2017-09-19 DIAGNOSIS — H02052 Trichiasis without entropian right lower eyelid: Secondary | ICD-10-CM | POA: Diagnosis not present

## 2017-12-04 ENCOUNTER — Other Ambulatory Visit: Payer: Self-pay | Admitting: Nurse Practitioner

## 2017-12-04 DIAGNOSIS — G63 Polyneuropathy in diseases classified elsewhere: Secondary | ICD-10-CM

## 2017-12-04 NOTE — Telephone Encounter (Signed)
Refill done in Rx pool LMOVM RF sent to pharmacy

## 2017-12-04 NOTE — Telephone Encounter (Signed)
Ov 12/29/17

## 2017-12-25 ENCOUNTER — Other Ambulatory Visit: Payer: Self-pay | Admitting: Nurse Practitioner

## 2017-12-25 ENCOUNTER — Other Ambulatory Visit: Payer: Medicare HMO

## 2017-12-25 DIAGNOSIS — I10 Essential (primary) hypertension: Secondary | ICD-10-CM

## 2017-12-26 LAB — CMP14+EGFR
ALBUMIN: 4.5 g/dL (ref 3.6–4.8)
ALT: 19 IU/L (ref 0–44)
AST: 16 IU/L (ref 0–40)
Albumin/Globulin Ratio: 2 (ref 1.2–2.2)
Alkaline Phosphatase: 41 IU/L (ref 39–117)
BUN / CREAT RATIO: 8 — AB (ref 10–24)
BUN: 8 mg/dL (ref 8–27)
Bilirubin Total: 0.3 mg/dL (ref 0.0–1.2)
CALCIUM: 9.8 mg/dL (ref 8.6–10.2)
CO2: 25 mmol/L (ref 20–29)
CREATININE: 1.03 mg/dL (ref 0.76–1.27)
Chloride: 102 mmol/L (ref 96–106)
GFR, EST AFRICAN AMERICAN: 88 mL/min/{1.73_m2} (ref >59)
GFR, EST NON AFRICAN AMERICAN: 76 mL/min/{1.73_m2} (ref >59)
GLUCOSE: 105 mg/dL — AB (ref 65–99)
Globulin, Total: 2.2 g/dL (ref 1.5–4.5)
Potassium: 4.2 mmol/L (ref 3.5–5.2)
Sodium: 141 mmol/L (ref 134–144)
TOTAL PROTEIN: 6.7 g/dL (ref 6.0–8.5)

## 2017-12-26 LAB — LIPID PANEL
CHOL/HDL RATIO: 4 ratio (ref 0.0–5.0)
Cholesterol, Total: 139 mg/dL (ref 100–199)
HDL: 35 mg/dL — AB (ref >39)
LDL Calculated: 84 mg/dL (ref 0–99)
Triglycerides: 102 mg/dL (ref 0–149)
VLDL Cholesterol Cal: 20 mg/dL (ref 5–40)

## 2017-12-29 ENCOUNTER — Encounter: Payer: Self-pay | Admitting: Nurse Practitioner

## 2017-12-29 ENCOUNTER — Ambulatory Visit (INDEPENDENT_AMBULATORY_CARE_PROVIDER_SITE_OTHER): Payer: Medicare HMO | Admitting: Nurse Practitioner

## 2017-12-29 VITALS — BP 128/89 | HR 83 | Temp 97.0°F | Ht 68.0 in | Wt 212.0 lb

## 2017-12-29 DIAGNOSIS — K219 Gastro-esophageal reflux disease without esophagitis: Secondary | ICD-10-CM

## 2017-12-29 DIAGNOSIS — M1A0710 Idiopathic chronic gout, right ankle and foot, without tophus (tophi): Secondary | ICD-10-CM | POA: Diagnosis not present

## 2017-12-29 DIAGNOSIS — Z6833 Body mass index (BMI) 33.0-33.9, adult: Secondary | ICD-10-CM

## 2017-12-29 DIAGNOSIS — G5691 Unspecified mononeuropathy of right upper limb: Secondary | ICD-10-CM

## 2017-12-29 DIAGNOSIS — E781 Pure hyperglyceridemia: Secondary | ICD-10-CM

## 2017-12-29 DIAGNOSIS — I6930 Unspecified sequelae of cerebral infarction: Secondary | ICD-10-CM | POA: Diagnosis not present

## 2017-12-29 DIAGNOSIS — G63 Polyneuropathy in diseases classified elsewhere: Secondary | ICD-10-CM

## 2017-12-29 DIAGNOSIS — I10 Essential (primary) hypertension: Secondary | ICD-10-CM | POA: Diagnosis not present

## 2017-12-29 DIAGNOSIS — H60331 Swimmer's ear, right ear: Secondary | ICD-10-CM | POA: Diagnosis not present

## 2017-12-29 DIAGNOSIS — M25511 Pain in right shoulder: Secondary | ICD-10-CM

## 2017-12-29 MED ORDER — BUPIVACAINE HCL 0.25 % IJ SOLN
1.0000 mL | Freq: Once | INTRAMUSCULAR | Status: AC
  Administered 2017-12-29: 1 mL via INTRA_ARTICULAR

## 2017-12-29 MED ORDER — OFLOXACIN 0.3 % OT SOLN: 5.0000 [drp] | Freq: Every day | OTIC | 0 refills | Status: DC

## 2017-12-29 MED ORDER — GABAPENTIN 100 MG PO CAPS: 100.0000 mg | ORAL_CAPSULE | Freq: Two times a day (BID) | ORAL | 1 refills | Status: DC

## 2017-12-29 MED ORDER — METHYLPREDNISOLONE ACETATE 40 MG/ML IJ SUSP
40.0000 mg | Freq: Once | INTRAMUSCULAR | Status: AC
  Administered 2017-12-29: 40 mg via INTRAMUSCULAR

## 2017-12-29 MED ORDER — DULOXETINE HCL 60 MG PO CPEP: 60.0000 mg | ORAL_CAPSULE | Freq: Every day | ORAL | 1 refills | Status: DC

## 2017-12-29 MED ORDER — OMEPRAZOLE 40 MG PO CPDR: 40.0000 mg | DELAYED_RELEASE_CAPSULE | Freq: Every day | ORAL | 1 refills | Status: DC

## 2017-12-29 NOTE — Progress Notes (Addendum)
Subjective:    Patient ID: David Garza, male    DOB: 05-04-1950, 67 y.o.   MRN: [id number]   Chief Complaint: Medical Management of Chronic Issues (Can't hear out of right ear)   HPI:  1. Essential hypertension  no c/o chest pain, sob or headache. Does not check blood pressure at home.  BP Readings from Last 3 Encounters:  12/29/17 128/89  09/13/17 (!) 96/58  09/12/17 124/84     2. Gastroesophageal reflux disease without esophagitis  Patient is on omepraole daily which really helps with his symptoms.  3. Hyperlipidemia, mixed Does not watch diet at all. Does stay very active  4. Idiopathic chronic gout of right foot without tophus  No recent gout flare ups.  5. BMI 33.0-33.9,adult  No recent weight chanegs  6. Late effect of cerebrovascular accident (CVA)  Had stroke 2 years ago. Has bad neuropathy in right arm and hand.  7. Neuropathy, arm, right  Has chronic numbness and tingling in right arm and hand. Would like to try increasing his neurontin. He says that cymbalta has helped some.    Outpatient Encounter Medications as of 12/29/2017  Medication Sig  . clopidogrel (PLAVIX) 75 MG tablet Take 1 tablet (75 mg total) by mouth daily.  . diclofenac sodium (VOLTAREN) 1 % GEL Apply 2 g topically 4 (four) times daily.  . DULoxetine (CYMBALTA) 60 MG capsule TAKE 1 CAPSULE BY MOUTH ONCE DAILY  . fish oil-omega-3 fatty acids 1000 MG capsule Take 2 g by mouth daily.  Marland Kitchen gabapentin (NEURONTIN) 100 MG capsule Take 1 capsule (100 mg total) by mouth 3 (three) times daily.  Marland Kitchen lisinopril-hydrochlorothiazide (PRINZIDE,ZESTORETIC) 20-25 MG tablet Take 1 tablet by mouth daily.  Marland Kitchen omeprazole (PRILOSEC) 40 MG capsule Take 1 capsule (40 mg total) by mouth daily.  . pravastatin (PRAVACHOL) 80 MG tablet Take 1 tablet (80 mg total) by mouth daily.      New complaints: Right shoulder pain. Has had it injected in past and it really helped. Right ear drainage   Social history: Lives  with wife. She still works at [employer]. Patient works on farm.   Review of Systems  Constitutional: Negative for activity change and appetite change.  HENT: Negative.   Eyes: Negative for pain.  Respiratory: Negative for shortness of breath.   Cardiovascular: Negative for chest pain, palpitations and leg swelling.  Gastrointestinal: Negative for abdominal pain.  Endocrine: Negative for polydipsia.  Genitourinary: Negative.   Skin: Negative for rash.  Neurological: Negative for dizziness, weakness and headaches.  Hematological: Does not bruise/bleed easily.  Psychiatric/Behavioral: Negative.   All other systems reviewed and are negative.      Objective:   Physical Exam  Constitutional: He is oriented to person, place, and time. He appears well-developed and well-nourished.  HENT:  Head: Normocephalic.  Right Ear: Tympanic membrane normal. There is drainage and tenderness.  Left Ear: Hearing, tympanic membrane, external ear and ear canal normal.  Nose: Nose normal.  Mouth/Throat: Oropharynx is clear and moist.  Eyes: Pupils are equal, round, and reactive to light. EOM are normal.  Neck: Normal range of motion and phonation normal. Neck supple. No JVD present. Carotid bruit is not present. No thyroid mass and no thyromegaly present.  Cardiovascular: Normal rate and regular rhythm.  Pulmonary/Chest: Effort normal and breath sounds normal. No respiratory distress.  Abdominal: Soft. Normal appearance, normal aorta and bowel sounds are normal. There is no tenderness.  Musculoskeletal: Normal range of motion.  Right grip weaker  then left  Lymphadenopathy:    He has no cervical adenopathy.  Neurological: He is alert and oriented to person, place, and time.  Skin: Skin is warm and dry.  Psychiatric: He has a normal mood and affect. His behavior is normal. Judgment and thought content normal.  Nursing note and vitals reviewed.  BP 128/89   Pulse 83   Temp (!) 97 F (36.1 C) (Oral)    Ht 5\' 8"  (1.727 m)   Wt 212 lb (96.2 kg)   BMI 32.23 kg/m   Procedure: right shoulder injection under sterile technique- marcaine 0.25 69ml with depomedrol 40mg  injected with 22g 1.5 inch needle.     Assessment & Plan:  David Garza comes in today with chief complaint of Medical Management of Chronic Issues (Can't hear out of right ear)   Diagnosis and orders addressed:  1. Essential hypertension Low sodium diet  2. Gastroesophageal reflux disease without esophagitis Avoid spicy foods Do not eat 2 hours prior to bedtime - omeprazole (PRILOSEC) 40 MG capsule; Take 1 capsule (40 mg total) by mouth daily.  Dispense: 90 capsule; Refill: 1  3. Hyperlipidemia, pure -low fat diet  4. Idiopathic chronic gout of right foot without tophus Low purine diet  5. BMI 33.0-33.9,adult Discussed diet and exercise for person with BMI >25 Will recheck weight in 3-6 months  6. Late effect of cerebrovascular accident (CVA)  7. Neuropathy, arm, right Increased neurontin Continue daily exercise  8. Neuropathy due to medical condition (HCC) Increase neurontin to 300mg  BID from 100mg  TID - DULoxetine (CYMBALTA) 60 MG capsule; Take 1 capsule (60 mg total) by mouth daily.  Dispense: 90 capsule; Refill: 1 - gabapentin (NEURONTIN) 100 MG capsule; Take 1 capsule (100 mg total) by mouth 2 (two) times daily.  Dispense: 180 capsule; Refill: 1  9. Right shoulder pain - joint injection  10. Right otitis externa -olfloxacin 2gtt BID for 7 days Avoid getting water in ears  Labs pending Health Maintenance reviewed Diet and exercise encouraged  Follow up plan: 3 months   Mary-Margaret Hassell Done, FNP

## 2017-12-29 NOTE — Patient Instructions (Signed)
Peripheral Neuropathy Peripheral neuropathy is a type of nerve damage. It affects nerves that carry signals between the spinal cord and other parts of the body. These are called peripheral nerves. With peripheral neuropathy, one nerve or a group of nerves may be damaged. What are the causes? Many things can damage peripheral nerves. For some people with peripheral neuropathy, the cause is unknown. Some causes include:  Diabetes. This is the most common cause of peripheral neuropathy.  Injury to a nerve.  Pressure or stress on a nerve that lasts a long time.  Too little vitamin B. Alcoholism can lead to this.  Infections.  Autoimmune diseases, such as multiple sclerosis and systemic lupus erythematosus.  Inherited nerve diseases.  Some medicines, such as cancer drugs.  Toxic substances, such as lead and mercury.  Too little blood flowing to the legs.  Kidney disease.  Thyroid disease.  What are the signs or symptoms? Different people have different symptoms. The symptoms you have will depend on which of your nerves is damaged. Common symptoms include:  Loss of feeling (numbness) in the feet and hands.  Tingling in the feet and hands.  Pain that burns.  Very sensitive skin.  Weakness.  Not being able to move a part of the body (paralysis).  Muscle twitching.  Clumsiness or poor coordination.  Loss of balance.  Not being able to control your bladder.  Feeling dizzy.  Sexual problems.  How is this diagnosed? Peripheral neuropathy is a symptom, not a disease. Finding the cause of peripheral neuropathy can be hard. To figure that out, your health care provider will take a medical history and do a physical exam. A neurological exam will also be done. This involves checking things affected by your brain, spinal cord, and nerves (nervous system). For example, your health care provider will check your reflexes, how you move, and what you can feel. Other types of tests  may also be ordered, such as:  Blood tests.  A test of the fluid in your spinal cord.  Imaging tests, such as CT scans or an MRI.  Electromyography (EMG). This test checks the nerves that control muscles.  Nerve conduction velocity tests. These tests check how fast messages pass through your nerves.  Nerve biopsy. A small piece of nerve is removed. It is then checked under a microscope.  How is this treated?  Medicine is often used to treat peripheral neuropathy. Medicines may include: ? Pain-relieving medicines. Prescription or over-the-counter medicine may be suggested. ? Antiseizure medicine. This may be used for pain. ? Antidepressants. These also may help ease pain from neuropathy. ? Lidocaine. This is a numbing medicine. You might wear a patch or be given a shot. ? Mexiletine. This medicine is typically used to help control irregular heart rhythms.  Surgery. Surgery may be needed to relieve pressure on a nerve or to destroy a nerve that is causing pain.  Physical therapy to help movement.  Assistive devices to help movement. Follow these instructions at home:  Only take over-the-counter or prescription medicines as directed by your health care provider. Follow the instructions carefully for any given medicines. Do not take any other medicines without first getting approval from your health care provider.  If you have diabetes, work closely with your health care provider to keep your blood sugar under control.  If you have numbness in your feet: ? Check every day for signs of injury or infection. Watch for redness, warmth, and swelling. ? Wear padded socks and comfortable   shoes. These help protect your feet.  Do not do things that put pressure on your damaged nerve.  Do not smoke. Smoking keeps blood from getting to damaged nerves.  Avoid or limit alcohol. Too much alcohol can cause a lack of B vitamins. These vitamins are needed for healthy nerves.  Develop a good  support system. Coping with peripheral neuropathy can be stressful. Talk to a mental health specialist or join a support group if you are struggling.  Follow up with your health care provider as directed. Contact a health care provider if:  You have new signs or symptoms of peripheral neuropathy.  You are struggling emotionally from dealing with peripheral neuropathy.  You have a fever. Get help right away if:  You have an injury or infection that is not healing.  You feel very dizzy or begin vomiting.  You have chest pain.  You have trouble breathing. This information is not intended to replace advice given to you by your health care provider. Make sure you discuss any questions you have with your health care provider. Document Released: 04/08/2002 Document Revised: 09/24/2015 Document Reviewed: 12/24/2012 Elsevier Interactive Patient Education  2017 [employer]  

## 2017-12-29 NOTE — Addendum Note (Signed)
Addended by: [name] on: 12/29/2017 08:58 AM   Modules accepted: Orders

## 2018-01-10 ENCOUNTER — Telehealth: Payer: Self-pay | Admitting: *Deleted

## 2018-01-10 MED ORDER — GABAPENTIN 300 MG PO CAPS: 300.0000 mg | ORAL_CAPSULE | Freq: Two times a day (BID) | ORAL | 2 refills | Status: DC

## 2018-01-10 NOTE — Telephone Encounter (Signed)
Wife came in, Gabapentin was increased at last visit to 300 mg BID, Rx at pharmacy is the 100 mg. Pt needs new Rx since it is too early to get a refill. Per 12/29/17 notes, new Rx sent to [facility]

## 2018-01-24 DIAGNOSIS — R69 Illness, unspecified: Secondary | ICD-10-CM | POA: Diagnosis not present

## 2018-01-29 ENCOUNTER — Ambulatory Visit: Payer: Medicare HMO

## 2018-03-17 ENCOUNTER — Encounter: Payer: Self-pay | Admitting: Physician Assistant

## 2018-03-17 ENCOUNTER — Ambulatory Visit (INDEPENDENT_AMBULATORY_CARE_PROVIDER_SITE_OTHER): Payer: Medicare HMO | Admitting: Physician Assistant

## 2018-03-17 VITALS — BP 117/75 | HR 97 | Temp 96.8°F | Ht 68.0 in | Wt 210.0 lb

## 2018-03-17 DIAGNOSIS — J0110 Acute frontal sinusitis, unspecified: Secondary | ICD-10-CM | POA: Diagnosis not present

## 2018-03-17 MED ORDER — AMOXICILLIN-POT CLAVULANATE 875-125 MG PO TABS: 1.0000 | ORAL_TABLET | Freq: Two times a day (BID) | ORAL | 0 refills | Status: DC

## 2018-03-17 MED ORDER — PREDNISONE 10 MG (21) PO TBPK: ORAL_TABLET | ORAL | 0 refills | Status: DC

## 2018-03-17 NOTE — Progress Notes (Signed)
BP 117/75 (BP Location: Left Arm)   Pulse 97   Temp (!) 96.8 F (36 C) (Oral)   Ht _0  (1.727 m)   Wt 210 lb (95.3 kg)   BMI 31.93 kg/m    Subjective:    Patient ID: David Garza, male    DOB: Jul 26, 1950, 67 y.o.   MRN: [id number]  HPI: Jeffrey Graefe is a 67 y.o. male presenting on 03/17/2018 for Cough and Sinusitis  This patient has had many days of sinus headache and postnasal drainage. There is copious drainage at times. Denies any fever at this time. There has been a history of sinus infections in the past.  No history of sinus surgery. There is cough at night. It has become more prevalent in recent days.   Past Medical History:  Diagnosis Date  . Hyperlipidemia   . Hypertension   . Metabolic syndrome   . Stroke [facility]) 06/2016   Relevant past medical, surgical, family and social history reviewed and updated as indicated. Interim medical history since our last visit reviewed. Allergies and medications reviewed and updated. DATA REVIEWED: CHART IN EPIC  Family History reviewed for pertinent findings.  Review of Systems  Constitutional: Positive for fatigue. Negative for appetite change and fever.  HENT: Positive for sinus pressure and sore throat.   Eyes: Negative.  Negative for pain and visual disturbance.  Respiratory: Positive for wheezing. Negative for cough, chest tightness and shortness of breath.   Cardiovascular: Negative.  Negative for chest pain, palpitations and leg swelling.  Gastrointestinal: Negative.  Negative for abdominal pain, diarrhea, nausea and vomiting.  Endocrine: Negative.   Genitourinary: Negative.   Musculoskeletal: Negative for back pain and myalgias.  Skin: Negative.  Negative for color change and rash.  Neurological: Positive for headaches. Negative for weakness and numbness.  Psychiatric/Behavioral: Negative.     Allergies as of 03/17/2018      Reactions   Meloxicam Hypertension   Nsaids Hypertension      Medication List         Accurate as of 03/17/18 12:01 PM. Always use your most recent med list.          amoxicillin-clavulanate 875-125 MG tablet Commonly known as:  AUGMENTIN Take 1 tablet by mouth 2 (two) times daily.   clopidogrel 75 MG tablet Commonly known as:  PLAVIX Take 1 tablet (75 mg total) by mouth daily.   diclofenac sodium 1 % Gel Commonly known as:  VOLTAREN Apply 2 g topically 4 (four) times daily.   DULoxetine 60 MG capsule Commonly known as:  CYMBALTA Take 1 capsule (60 mg total) by mouth daily.   fish oil-omega-3 fatty acids 1000 MG capsule Take 2 g by mouth daily.   gabapentin 300 MG capsule Commonly known as:  NEURONTIN Take 1 capsule (300 mg total) by mouth 2 (two) times daily.   lisinopril-hydrochlorothiazide 20-25 MG tablet Commonly known as:  PRINZIDE,ZESTORETIC Take 1 tablet by mouth daily.   ofloxacin 0.3 % OTIC solution Commonly known as:  FLOXIN Place 5 drops into both ears daily.   omeprazole 40 MG capsule Commonly known as:  PRILOSEC Take 1 capsule (40 mg total) by mouth daily.   pravastatin 80 MG tablet Commonly known as:  PRAVACHOL Take 1 tablet (80 mg total) by mouth daily.   predniSONE 10 MG (21) Tbpk tablet Commonly known as:  STERAPRED UNI-PAK 21 TAB As directed x 6 days          Objective:  BP 117/75 (BP Location: Left Arm)   Pulse 97   Temp (!) 96.8 F (36 C) (Oral)   Ht _0  (1.727 m)   Wt 210 lb (95.3 kg)   BMI 31.93 kg/m   Allergies  Allergen Reactions  . Meloxicam Hypertension  . Nsaids Hypertension    Wt Readings from Last 3 Encounters:  03/17/18 210 lb (95.3 kg)  12/29/17 212 lb (96.2 kg)  09/13/17 214 lb (97.1 kg)    Physical Exam  Constitutional: He is oriented to person, place, and time. He appears well-developed and well-nourished.  HENT:  Head: Normocephalic and atraumatic.  Right Ear: Tympanic membrane and external ear normal. No middle ear effusion.  Left Ear: Tympanic membrane and external ear  normal.  No middle ear effusion.  Nose: Mucosal edema and rhinorrhea present. Right sinus exhibits no maxillary sinus tenderness. Left sinus exhibits frontal sinus tenderness. Left sinus exhibits no maxillary sinus tenderness.  Mouth/Throat: Uvula is midline. Posterior oropharyngeal erythema present.  Eyes: Pupils are equal, round, and reactive to light. Conjunctivae and EOM are normal. Right eye exhibits no discharge. Left eye exhibits no discharge.  Neck: Normal range of motion.  Cardiovascular: Normal rate, regular rhythm and normal heart sounds.  Pulmonary/Chest: Effort normal and breath sounds normal. No respiratory distress. He has no wheezes.  Abdominal: Soft.  Lymphadenopathy:    He has no cervical adenopathy.  Neurological: He is alert and oriented to person, place, and time.  Skin: Skin is warm and dry.  Psychiatric: He has a normal mood and affect.    Results for orders placed or performed in visit on 12/25/17  Lipid panel  Result Value Ref Range   Cholesterol, Total 139 100 - 199 mg/dL   Triglycerides 102 0 - 149 mg/dL   HDL 35 (L) >39 mg/dL   VLDL Cholesterol Cal 20 5 - 40 mg/dL   LDL Calculated 84 0 - 99 mg/dL   Chol/HDL Ratio 4.0 0.0 - 5.0 ratio  CMP14+EGFR  Result Value Ref Range   Glucose 105 (H) 65 - 99 mg/dL   BUN 8 8 - 27 mg/dL   Creatinine, Ser 1.03 0.76 - 1.27 mg/dL   GFR calc non Af Amer 76 >59 mL/min/1.73   GFR calc Af Amer 88 >59 mL/min/1.73   BUN/Creatinine Ratio 8 (L) 10 - 24   Sodium 141 134 - 144 mmol/L   Potassium 4.2 3.5 - 5.2 mmol/L   Chloride 102 96 - 106 mmol/L   CO2 25 20 - 29 mmol/L   Calcium 9.8 8.6 - 10.2 mg/dL   Total Protein 6.7 6.0 - 8.5 g/dL   Albumin 4.5 3.6 - 4.8 g/dL   Globulin, Total 2.2 1.5 - 4.5 g/dL   Albumin/Globulin Ratio 2.0 1.2 - 2.2   Bilirubin Total 0.3 0.0 - 1.2 mg/dL   Alkaline Phosphatase 41 39 - 117 IU/L   AST 16 0 - 40 IU/L   ALT 19 0 - 44 IU/L      Assessment & Plan:   1. Acute non-recurrent frontal  sinusitis - amoxicillin-clavulanate (AUGMENTIN) 875-125 MG tablet; Take 1 tablet by mouth 2 (two) times daily.  Dispense: 20 tablet; Refill: 0 - predniSONE (STERAPRED UNI-PAK 21 TAB) 10 MG (21) TBPK tablet; As directed x 6 days  Dispense: 21 tablet; Refill: 0   Continue all other maintenance medications as listed above.  Follow up plan: No follow-ups on file.  Educational handout given for [place] PA-C [facility] [address], [place] [address] [phone]   03/17/2018, 12:01 PM

## 2018-03-26 ENCOUNTER — Other Ambulatory Visit: Payer: Self-pay | Admitting: Nurse Practitioner

## 2018-03-26 ENCOUNTER — Other Ambulatory Visit: Payer: Medicare HMO

## 2018-03-26 DIAGNOSIS — E781 Pure hyperglyceridemia: Secondary | ICD-10-CM | POA: Diagnosis not present

## 2018-03-26 DIAGNOSIS — I10 Essential (primary) hypertension: Secondary | ICD-10-CM

## 2018-03-26 LAB — CMP14+EGFR
ALT: 17 IU/L (ref 0–44)
AST: 11 IU/L (ref 0–40)
Albumin/Globulin Ratio: 2 (ref 1.2–2.2)
Albumin: 4.2 g/dL (ref 3.6–4.8)
Alkaline Phosphatase: 46 IU/L (ref 39–117)
BUN/Creatinine Ratio: 10 (ref 10–24)
BUN: 11 mg/dL (ref 8–27)
Bilirubin Total: 0.5 mg/dL (ref 0.0–1.2)
CALCIUM: 9.5 mg/dL (ref 8.6–10.2)
CHLORIDE: 100 mmol/L (ref 96–106)
CO2: 25 mmol/L (ref 20–29)
CREATININE: 1.07 mg/dL (ref 0.76–1.27)
GFR calc Af Amer: 83 mL/min/{1.73_m2} (ref >59)
GFR, EST NON AFRICAN AMERICAN: 72 mL/min/{1.73_m2} (ref >59)
GLUCOSE: 91 mg/dL (ref 65–99)
Globulin, Total: 2.1 g/dL (ref 1.5–4.5)
Potassium: 4 mmol/L (ref 3.5–5.2)
Sodium: 139 mmol/L (ref 134–144)
Total Protein: 6.3 g/dL (ref 6.0–8.5)

## 2018-03-26 LAB — LIPID PANEL
CHOL/HDL RATIO: 4.4 ratio (ref 0.0–5.0)
Cholesterol, Total: 151 mg/dL (ref 100–199)
HDL: 34 mg/dL — AB (ref >39)
LDL Calculated: 85 mg/dL (ref 0–99)
Triglycerides: 158 mg/dL — ABNORMAL HIGH (ref 0–149)
VLDL CHOLESTEROL CAL: 32 mg/dL (ref 5–40)

## 2018-04-04 ENCOUNTER — Encounter: Payer: Self-pay | Admitting: Nurse Practitioner

## 2018-04-04 ENCOUNTER — Ambulatory Visit (INDEPENDENT_AMBULATORY_CARE_PROVIDER_SITE_OTHER): Payer: Medicare HMO | Admitting: Nurse Practitioner

## 2018-04-04 VITALS — BP 131/78 | HR 69 | Temp 96.8°F | Ht 68.0 in | Wt 212.0 lb

## 2018-04-04 DIAGNOSIS — Z6833 Body mass index (BMI) 33.0-33.9, adult: Secondary | ICD-10-CM

## 2018-04-04 DIAGNOSIS — M1A0710 Idiopathic chronic gout, right ankle and foot, without tophus (tophi): Secondary | ICD-10-CM | POA: Diagnosis not present

## 2018-04-04 DIAGNOSIS — I10 Essential (primary) hypertension: Secondary | ICD-10-CM

## 2018-04-04 DIAGNOSIS — I6930 Unspecified sequelae of cerebral infarction: Secondary | ICD-10-CM | POA: Diagnosis not present

## 2018-04-04 DIAGNOSIS — G5691 Unspecified mononeuropathy of right upper limb: Secondary | ICD-10-CM | POA: Diagnosis not present

## 2018-04-04 DIAGNOSIS — E782 Mixed hyperlipidemia: Secondary | ICD-10-CM

## 2018-04-04 DIAGNOSIS — K219 Gastro-esophageal reflux disease without esophagitis: Secondary | ICD-10-CM

## 2018-04-04 DIAGNOSIS — G63 Polyneuropathy in diseases classified elsewhere: Secondary | ICD-10-CM | POA: Diagnosis not present

## 2018-04-04 DIAGNOSIS — E781 Pure hyperglyceridemia: Secondary | ICD-10-CM

## 2018-04-04 MED ORDER — DULOXETINE HCL 60 MG PO CPEP: 60.0000 mg | ORAL_CAPSULE | Freq: Every day | ORAL | 1 refills | Status: DC

## 2018-04-04 MED ORDER — PRAVASTATIN SODIUM 80 MG PO TABS: 80.0000 mg | ORAL_TABLET | Freq: Every day | ORAL | 1 refills | Status: DC

## 2018-04-04 MED ORDER — OMEPRAZOLE 40 MG PO CPDR: 40.0000 mg | DELAYED_RELEASE_CAPSULE | Freq: Every day | ORAL | 1 refills | Status: DC

## 2018-04-04 MED ORDER — CLOPIDOGREL BISULFATE 75 MG PO TABS: 75.0000 mg | ORAL_TABLET | Freq: Every day | ORAL | 1 refills | Status: DC

## 2018-04-04 MED ORDER — LISINOPRIL-HYDROCHLOROTHIAZIDE 20-25 MG PO TABS: 1.0000 | ORAL_TABLET | Freq: Every day | ORAL | 1 refills | Status: DC

## 2018-04-04 MED ORDER — GABAPENTIN 100 MG PO CAPS: 100.0000 mg | ORAL_CAPSULE | Freq: Three times a day (TID) | ORAL | 3 refills | Status: DC

## 2018-04-04 NOTE — Patient Instructions (Signed)
Peripheral Neuropathy Peripheral neuropathy is a type of nerve damage. It affects nerves that carry signals between the spinal cord and other parts of the body. These are called peripheral nerves. With peripheral neuropathy, one nerve or a group of nerves may be damaged. What are the causes? Many things can damage peripheral nerves. For some people with peripheral neuropathy, the cause is unknown. Some causes include:  Diabetes. This is the most common cause of peripheral neuropathy.  Injury to a nerve.  Pressure or stress on a nerve that lasts a long time.  Too little vitamin B. Alcoholism can lead to this.  Infections.  Autoimmune diseases, such as multiple sclerosis and systemic lupus erythematosus.  Inherited nerve diseases.  Some medicines, such as cancer drugs.  Toxic substances, such as lead and mercury.  Too little blood flowing to the legs.  Kidney disease.  Thyroid disease.  What are the signs or symptoms? Different people have different symptoms. The symptoms you have will depend on which of your nerves is damaged. Common symptoms include:  Loss of feeling (numbness) in the feet and hands.  Tingling in the feet and hands.  Pain that burns.  Very sensitive skin.  Weakness.  Not being able to move a part of the body (paralysis).  Muscle twitching.  Clumsiness or poor coordination.  Loss of balance.  Not being able to control your bladder.  Feeling dizzy.  Sexual problems.  How is this diagnosed? Peripheral neuropathy is a symptom, not a disease. Finding the cause of peripheral neuropathy can be hard. To figure that out, your health care provider will take a medical history and do a physical exam. A neurological exam will also be done. This involves checking things affected by your brain, spinal cord, and nerves (nervous system). For example, your health care provider will check your reflexes, how you move, and what you can feel. Other types of tests  may also be ordered, such as:  Blood tests.  A test of the fluid in your spinal cord.  Imaging tests, such as CT scans or an MRI.  Electromyography (EMG). This test checks the nerves that control muscles.  Nerve conduction velocity tests. These tests check how fast messages pass through your nerves.  Nerve biopsy. A small piece of nerve is removed. It is then checked under a microscope.  How is this treated?  Medicine is often used to treat peripheral neuropathy. Medicines may include: ? Pain-relieving medicines. Prescription or over-the-counter medicine may be suggested. ? Antiseizure medicine. This may be used for pain. ? Antidepressants. These also may help ease pain from neuropathy. ? Lidocaine. This is a numbing medicine. You might wear a patch or be given a shot. ? Mexiletine. This medicine is typically used to help control irregular heart rhythms.  Surgery. Surgery may be needed to relieve pressure on a nerve or to destroy a nerve that is causing pain.  Physical therapy to help movement.  Assistive devices to help movement. Follow these instructions at home:  Only take over-the-counter or prescription medicines as directed by your health care provider. Follow the instructions carefully for any given medicines. Do not take any other medicines without first getting approval from your health care provider.  If you have diabetes, work closely with your health care provider to keep your blood sugar under control.  If you have numbness in your feet: ? Check every day for signs of injury or infection. Watch for redness, warmth, and swelling. ? Wear padded socks and comfortable   shoes. These help protect your feet.  Do not do things that put pressure on your damaged nerve.  Do not smoke. Smoking keeps blood from getting to damaged nerves.  Avoid or limit alcohol. Too much alcohol can cause a lack of B vitamins. These vitamins are needed for healthy nerves.  Develop a good  support system. Coping with peripheral neuropathy can be stressful. Talk to a mental health specialist or join a support group if you are struggling.  Follow up with your health care provider as directed. Contact a health care provider if:  You have new signs or symptoms of peripheral neuropathy.  You are struggling emotionally from dealing with peripheral neuropathy.  You have a fever. Get help right away if:  You have an injury or infection that is not healing.  You feel very dizzy or begin vomiting.  You have chest pain.  You have trouble breathing. This information is not intended to replace advice given to you by your health care provider. Make sure you discuss any questions you have with your health care provider. Document Released: 04/08/2002 Document Revised: 09/24/2015 Document Reviewed: 12/24/2012 Elsevier Interactive Patient Education  2017 [employer]  

## 2018-04-04 NOTE — Progress Notes (Signed)
Subjective:    Patient ID: David Garza, male    DOB: [birth year], 66 y.o.   MRN: [id number]   Chief Complaint: medical management of chronic issues  HPI:  1. Essential hypertension  No c/o chest pain, sob or headache. Does not check blood pressure at home. BP Readings from Last 3 Encounters:  03/17/18 117/75  12/29/17 128/89  09/13/17 (!) 96/58     2. Gastroesophageal reflux disease without esophagitis  Is on omeprazole daily which she says keeps symptoms under control.  3. Neuropathy, arm, right  This is secondary to stroke he had last year. The burning sensation has improved some. Has dropped back to gabapentin to 1 per day. If he takes 2 a day then his lip swells. He would like  To go back to taking 100mg  TID.  4. BMI 33.0-33.9,adult  No recent weight changes  5. Idiopathic chronic gout of right foot without tophus  Has not had recent flare up.  6. Pure hypertriglyceridemia  His wife tries to watch what he eats. 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 Late effect of cerebrovascular accident (CVA)  He is doing really well. Has no weakness just neuropathy of right hand.    Outpatient Encounter Medications as of 04/04/2018  Medication Sig  . amoxicillin-clavulanate (AUGMENTIN) 875-125 MG tablet Take 1 tablet by mouth 2 (two) times daily.  . clopidogrel (PLAVIX) 75 MG tablet Take 1 tablet (75 mg total) by mouth daily.  . diclofenac sodium (VOLTAREN) 1 % GEL Apply 2 g topically 4 (four) times daily.  . DULoxetine (CYMBALTA) 60 MG capsule Take 1 capsule (60 mg total) by mouth daily.  . fish oil-omega-3 fatty acids 1000 MG capsule Take 2 g by mouth daily.  Marland Kitchen gabapentin (NEURONTIN) 300 MG capsule Take 1 capsule (300 mg total) by mouth 2 (two) times daily.  Marland Kitchen lisinopril-hydrochlorothiazide (PRINZIDE,ZESTORETIC) 20-25 MG tablet Take 1 tablet by mouth daily.  Marland Kitchen ofloxacin (FLOXIN OTIC) 0.3 % OTIC solution Place 5 drops into both ears daily.  Marland Kitchen omeprazole (PRILOSEC) 40 MG capsule  Take 1 capsule (40 mg total) by mouth daily.  . pravastatin (PRAVACHOL) 80 MG tablet Take 1 tablet (80 mg total) by mouth daily.  . predniSONE (STERAPRED UNI-PAK 21 TAB) 10 MG (21) TBPK tablet As directed x 6 days      New complaints: None today  Social history: Wife keeps close eye on him. 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   Review of Systems  Constitutional: Negative for activity change and appetite change.  HENT: Negative.   Eyes: Negative for pain.  Respiratory: Negative for shortness of breath.   Cardiovascular: Negative for chest pain, palpitations and leg swelling.  Gastrointestinal: Negative for abdominal pain.  Endocrine: Negative for polydipsia.  Genitourinary: Negative.   Musculoskeletal:       Burning sedation of right hand  Skin: Negative for rash.  Neurological: Negative for dizziness, weakness and headaches.  Hematological: Does not bruise/bleed easily.  Psychiatric/Behavioral: Negative.   All other systems reviewed and are negative.      Objective:   Physical Exam  Constitutional: He is oriented to person, place, and time. He appears well-developed and well-nourished.  HENT:  Head: Normocephalic.  Nose: Nose normal.  Mouth/Throat: Oropharynx is clear and moist.  Eyes: Pupils are equal, round, and reactive to light. EOM are normal.  Neck: Normal range of motion and phonation normal. Neck supple. No JVD present. Carotid bruit is not present. No thyroid  mass and no thyromegaly present.  Cardiovascular: Normal rate and regular rhythm.  Pulmonary/Chest: Effort normal and breath sounds normal. No respiratory distress.  Abdominal: Soft. Normal appearance, normal aorta and bowel sounds are normal. There is no tenderness.  Musculoskeletal: Normal range of motion.  Lymphadenopathy:    He has no cervical adenopathy.  Neurological: He is alert and oriented to person, place, and time.  Skin: Skin is warm and dry.  Psychiatric: He has a normal mood  and affect. His behavior is normal. Judgment and thought content normal.  Nursing note and vitals reviewed.  BP 131/78   Pulse 69   Temp (!) 96.8 F (36 C) (Oral)   Ht 5\' 8"  (1.727 m)   Wt 212 lb (96.2 kg)   BMI 32.23 kg/m        Assessment & Plan:  David Garza comes in today with chief complaint of Medical Management of Chronic Issues   Diagnosis and orders addressed:  1. Essential hypertension Low sodium diet - lisinopril-hydrochlorothiazide (PRINZIDE,ZESTORETIC) 20-25 MG tablet; Take 1 tablet by mouth daily.  Dispense: 90 tablet; Refill: 1  2. Gastroesophageal reflux disease without esophagitis Avoid spicy foods Do not eat 2 hours prior to bedtime - omeprazole (PRILOSEC) 40 MG capsule; Take 1 capsule (40 mg total) by mouth daily.  Dispense: 90 capsule; Refill: 1  3. Neuropathy, arm, right Changed neurontin to 100mg  TID   DULoxetine (CYMBALTA) 60 MG capsule; Take 1 capsule (60 mg total) by mouth daily.  Dispense: 90 capsule; Refill: 1   4. BMI 33.0-33.9,adult Discussed diet and exercise for person with BMI >25 Will recheck weight in 3-6 months  5. Idiopathic chronic gout of right foot without tophus Low purine diet  6. Pure hypertriglyceridemia Low fat diet  7. Late effect of cerebrovascular accident (CVA) - clopidogrel (PLAVIX) 75 MG tablet; Take 1 tablet (75 mg total) by mouth daily.  Dispense: 90 tablet; Refill: 1  8. Mixed hyperlipidemia Low fat diet - pravastatin (PRAVACHOL) 80 MG tablet; Take 1 tablet (80 mg total) by mouth daily.  Dispense: 90 tablet; Refill: 1  - Labs pending Health Maintenance reviewed Diet and exercise encouraged  Follow up plan: 3 months   Mary-Margaret Hassell Done, FNP

## 2018-07-02 ENCOUNTER — Other Ambulatory Visit: Payer: Medicare HMO

## 2018-07-02 DIAGNOSIS — I10 Essential (primary) hypertension: Secondary | ICD-10-CM | POA: Diagnosis not present

## 2018-07-02 DIAGNOSIS — E781 Pure hyperglyceridemia: Secondary | ICD-10-CM | POA: Diagnosis not present

## 2018-07-03 LAB — LIPID PANEL
Chol/HDL Ratio: 5 ratio (ref 0.0–5.0)
Cholesterol, Total: 160 mg/dL (ref 100–199)
HDL: 32 mg/dL — ABNORMAL LOW (ref >39)
LDL Calculated: 86 mg/dL (ref 0–99)
Triglycerides: 211 mg/dL — ABNORMAL HIGH (ref 0–149)
VLDL Cholesterol Cal: 42 mg/dL — ABNORMAL HIGH (ref 5–40)

## 2018-07-03 LAB — CMP14+EGFR
A/G RATIO: 2.2 (ref 1.2–2.2)
ALK PHOS: 42 IU/L (ref 39–117)
ALT: 15 IU/L (ref 0–44)
AST: 17 IU/L (ref 0–40)
Albumin: 4.8 g/dL (ref 3.8–4.8)
BUN/Creatinine Ratio: 9 — ABNORMAL LOW (ref 10–24)
BUN: 10 mg/dL (ref 8–27)
Bilirubin Total: 0.8 mg/dL (ref 0.0–1.2)
CALCIUM: 10 mg/dL (ref 8.6–10.2)
CO2: 25 mmol/L (ref 20–29)
CREATININE: 1.17 mg/dL (ref 0.76–1.27)
Chloride: 101 mmol/L (ref 96–106)
GFR calc Af Amer: 75 mL/min/{1.73_m2} (ref >59)
GFR, EST NON AFRICAN AMERICAN: 65 mL/min/{1.73_m2} (ref >59)
GLOBULIN, TOTAL: 2.2 g/dL (ref 1.5–4.5)
Glucose: 100 mg/dL — ABNORMAL HIGH (ref 65–99)
POTASSIUM: 4.1 mmol/L (ref 3.5–5.2)
SODIUM: 141 mmol/L (ref 134–144)
Total Protein: 7 g/dL (ref 6.0–8.5)

## 2018-07-05 ENCOUNTER — Encounter: Payer: Self-pay | Admitting: Nurse Practitioner

## 2018-07-05 ENCOUNTER — Ambulatory Visit (INDEPENDENT_AMBULATORY_CARE_PROVIDER_SITE_OTHER): Payer: Medicare HMO | Admitting: Nurse Practitioner

## 2018-07-05 VITALS — BP 129/88 | HR 77 | Temp 97.0°F | Ht 68.0 in | Wt 214.0 lb

## 2018-07-05 DIAGNOSIS — I10 Essential (primary) hypertension: Secondary | ICD-10-CM

## 2018-07-05 DIAGNOSIS — E781 Pure hyperglyceridemia: Secondary | ICD-10-CM

## 2018-07-05 DIAGNOSIS — Z23 Encounter for immunization: Secondary | ICD-10-CM

## 2018-07-05 DIAGNOSIS — E782 Mixed hyperlipidemia: Secondary | ICD-10-CM | POA: Diagnosis not present

## 2018-07-05 DIAGNOSIS — G63 Polyneuropathy in diseases classified elsewhere: Secondary | ICD-10-CM | POA: Diagnosis not present

## 2018-07-05 DIAGNOSIS — K219 Gastro-esophageal reflux disease without esophagitis: Secondary | ICD-10-CM | POA: Diagnosis not present

## 2018-07-05 DIAGNOSIS — M1A0710 Idiopathic chronic gout, right ankle and foot, without tophus (tophi): Secondary | ICD-10-CM

## 2018-07-05 DIAGNOSIS — Z6833 Body mass index (BMI) 33.0-33.9, adult: Secondary | ICD-10-CM

## 2018-07-05 DIAGNOSIS — I6930 Unspecified sequelae of cerebral infarction: Secondary | ICD-10-CM

## 2018-07-05 MED ORDER — CLOPIDOGREL BISULFATE 75 MG PO TABS: 75.0000 mg | ORAL_TABLET | Freq: Every day | ORAL | 1 refills | Status: DC

## 2018-07-05 MED ORDER — OMEPRAZOLE 40 MG PO CPDR: 40.0000 mg | DELAYED_RELEASE_CAPSULE | Freq: Every day | ORAL | 1 refills | Status: DC

## 2018-07-05 MED ORDER — GABAPENTIN 100 MG PO CAPS: 200.0000 mg | ORAL_CAPSULE | Freq: Two times a day (BID) | ORAL | 1 refills | Status: DC

## 2018-07-05 MED ORDER — LISINOPRIL-HYDROCHLOROTHIAZIDE 20-25 MG PO TABS: 1.0000 | ORAL_TABLET | Freq: Every day | ORAL | 1 refills | Status: DC

## 2018-07-05 MED ORDER — DULOXETINE HCL 60 MG PO CPEP: 60.0000 mg | ORAL_CAPSULE | Freq: Every day | ORAL | 1 refills | Status: DC

## 2018-07-05 MED ORDER — PRAVASTATIN SODIUM 80 MG PO TABS: 80.0000 mg | ORAL_TABLET | Freq: Every day | ORAL | 1 refills | Status: DC

## 2018-07-05 NOTE — Progress Notes (Signed)
Subjective:    Patient ID: David Garza, male    DOB: 30-Jul-1950, 68 y.o.   MRN: [id number]   Chief Complaint: medical management of chronic issues  HPI:  1. Essential hypertension  Noc/ochest pain, sob or headache. Does not check blood pressure at home. BP Readings from Last 3 Encounters:  04/04/18 131/78  03/17/18 117/75  12/29/17 128/89     2. Pure hypertriglyceridemia  His wife tries to help him watch his diet and most days he does. 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  3. Late effect of cerebrovascular accident (CVA)  Still has some mild weakness on right.  4. Neuropathy, arm, right  Since his stroke he has numbness in right arm and had. He says it tingles all the time. The gabapentin helps some. He has been taking gabapentin 300mg  in morning and 100mg  in evening and that has really worked well for him.  5. Gastroesophageal reflux disease without esophagitis  Takes omeprazole daily to keep symptoms under control  6. Idiopathic chronic Had gout flare up yesterday. Took colchicine and it is better today  7. BMI 33.0-33.9,adult  Weight is up 2 lbs    Outpatient Encounter Medications as of 07/05/2018  Medication Sig  . clopidogrel (PLAVIX) 75 MG tablet Take 1 tablet (75 mg total) by mouth daily.  . DULoxetine (CYMBALTA) 60 MG capsule Take 1 capsule (60 mg total) by mouth daily.  . fish oil-omega-3 fatty acids 1000 MG capsule Take 2 g by mouth daily.  Marland Kitchen gabapentin (NEURONTIN) 100 MG capsule Take 1 capsule (100 mg total) by mouth 3 (three) times daily.  Marland Kitchen lisinopril-hydrochlorothiazide (PRINZIDE,ZESTORETIC) 20-25 MG tablet Take 1 tablet by mouth daily.  Marland Kitchen omeprazole (PRILOSEC) 40 MG capsule Take 1 capsule (40 mg total) by mouth daily.  . pravastatin (PRAVACHOL) 80 MG tablet Take 1 tablet (80 mg total) by mouth daily.      New complaints: None today  Social history: Lives on farm and stays very busy   Review of Systems  Constitutional: Negative for activity  change and appetite change.  HENT: Negative.   Eyes: Negative for pain.  Respiratory: Negative for shortness of breath.   Cardiovascular: Negative for chest pain, palpitations and leg swelling.  Gastrointestinal: Negative for abdominal pain.  Endocrine: Negative for polydipsia.  Genitourinary: Negative.   Skin: Negative for rash.  Neurological: Positive for numbness (tips of right fingers.). Negative for dizziness, weakness and headaches.  Hematological: Does not bruise/bleed easily.  Psychiatric/Behavioral: Negative.   All other systems reviewed and are negative.      Objective:   Physical Exam Vitals signs and nursing note reviewed.  Constitutional:      Appearance: Normal appearance. He is well-developed.  HENT:     Head: Normocephalic.     Nose: Nose normal.  Eyes:     Pupils: Pupils are equal, round, and reactive to light.  Neck:     Musculoskeletal: Normal range of motion and neck supple.     Thyroid: No thyroid mass or thyromegaly.     Vascular: No carotid bruit or JVD.     Trachea: Phonation normal.  Cardiovascular:     Rate and Rhythm: Normal rate and regular rhythm.  Pulmonary:     Effort: Pulmonary effort is normal. No respiratory distress.     Breath sounds: Normal breath sounds.  Abdominal:     General: Bowel sounds are normal.     Palpations: Abdomen is soft.     Tenderness: There  is no abdominal tenderness.  Musculoskeletal: Normal range of motion.  Lymphadenopathy:     Cervical: No cervical adenopathy.  Skin:    General: Skin is warm and dry.  Neurological:     Mental Status: He is alert and oriented to person, place, and time.  Psychiatric:        Behavior: Behavior normal.        Thought Content: Thought content normal.        Judgment: Judgment normal.    BP 129/88   Pulse 77   Temp (!) 97 F (36.1 C) (Oral)   Ht 5\' 8"  (1.727 m)   Wt 214 lb (97.1 kg)   BMI 32.54 kg/m       Assessment & Plan:  David Garza comes in today with  chief complaint of No chief complaint on file.   Diagnosis and orders addressed:  1. Essential hypertension Low sodium diet - lisinopril-hydrochlorothiazide (PRINZIDE,ZESTORETIC) 20-25 MG tablet; Take 1 tablet by mouth daily.  Dispense: 90 tablet; Refill: 1  2. Pure hypertriglyceridemia Low fat diet  3. Late effect of cerebrovascular accident (CVA) Continue to exercise right hand and leg - clopidogrel (PLAVIX) 75 MG tablet; Take 1 tablet (75 mg total) by mouth daily.  Dispense: 90 tablet; Refill: 1  4. Gastroesophageal reflux disease without esophagitis Avoid spicy foods Do not eat 2 hours prior to bedtime - omeprazole (PRILOSEC) 40 MG capsule; Take 1 capsule (40 mg total) by mouth daily.  Dispense: 90 capsule; Refill: 1  5. Idiopathic chronic gout of right foot without tophus Colchicine as needed  6. BMI 33.0-33.9,adult Discussed diet and exercise for person with BMI >25 Will recheck weight in 3-6 months  7. Mixed hyperlipidemia Low fat diet - pravastatin (PRAVACHOL) 80 MG tablet; Take 1 tablet (80 mg total) by mouth daily.  Dispense: 90 tablet; Refill: 1  8. Neuropathy due to medical condition (HCC) - DULoxetine (CYMBALTA) 60 MG capsule; Take 1 capsule (60 mg total) by mouth daily.  Dispense: 90 capsule; Refill: 1 - gabapentin (NEURONTIN) 100 MG capsule; Take 2 capsules (200 mg total) by mouth 2 (two) times daily.  Dispense: 180 capsule; Refill: 1   Labs pending Health Maintenance reviewed Diet and exercise encouraged  Follow up plan: 3 months   Mary-Margaret Hassell Done, FNP

## 2018-07-05 NOTE — Patient Instructions (Signed)
Fall Prevention in the Home, Adult  Falls can cause injuries. They can happen to people of all ages. There are many things you can do to make your home safe and to help prevent falls. Ask for help when making these changes, if needed.  What actions can I take to prevent falls?  General Instructions  · Use good lighting in all rooms. Replace any light bulbs that burn out.  · Turn on the lights when you go into a dark area. Use night-lights.  · Keep items that you use often in easy-to-reach places. Lower the shelves around your home if necessary.  · Set up your furniture so you have a clear path. Avoid moving your furniture around.  · Do not have throw rugs and other things on the floor that can make you trip.  · Avoid walking on wet floors.  · If any of your floors are uneven, fix them.  · Add color or contrast paint or tape to clearly mark and help you see:  ? Any grab bars or handrails.  ? First and last steps of stairways.  ? Where the edge of each step is.  · If you use a stepladder:  ? Make sure that it is fully opened. Do not climb a closed stepladder.  ? Make sure that both sides of the stepladder are locked into place.  ? Ask someone to hold the stepladder for you while you use it.  · If there are any pets around you, be aware of where they are.  What can I do in the bathroom?         · Keep the floor dry. Clean up any water that spills onto the floor as soon as it happens.  · Remove soap buildup in the tub or shower regularly.  · Use non-skid mats or decals on the floor of the tub or shower.  · Attach bath mats securely with double-sided, non-slip rug tape.  · If you need to sit down in the shower, use a plastic, non-slip stool.  · Install grab bars by the toilet and in the tub and shower. Do not use towel bars as grab bars.  What can I do in the bedroom?  · Make sure that you have a light by your bed that is easy to reach.  · Do not use any sheets or blankets that are too big for your bed. They should  not hang down onto the floor.  · Have a firm chair that has side arms. You can use this for support while you get dressed.  What can I do in the kitchen?  · Clean up any spills right away.  · If you need to reach something above you, use a strong step stool that has a grab bar.  · Keep electrical cords out of the way.  · Do not use floor polish or wax that makes floors slippery. If you must use wax, use non-skid floor wax.  What can I do with my stairs?  · Do not leave any items on the stairs.  · Make sure that you have a light switch at the top of the stairs and the bottom of the stairs. If you do not have them, ask someone to add them for you.  · Make sure that there are handrails on both sides of the stairs, and use them. Fix handrails that are broken or loose. Make sure that handrails are as long as the stairways.  ·   Install non-slip stair treads on all stairs in your home.  · Avoid having throw rugs at the top or bottom of the stairs. If you do have throw rugs, attach them to the floor with carpet tape.  · Choose a carpet that does not hide the edge of the steps on the stairway.  · Check any carpeting to make sure that it is firmly attached to the stairs. Fix any carpet that is loose or worn.  What can I do on the outside of my home?  · Use bright outdoor lighting.  · Regularly fix the edges of walkways and driveways and fix any cracks.  · Remove anything that might make you trip as you walk through a door, such as a raised step or threshold.  · Trim any bushes or trees on the path to your home.  · Regularly check to see if handrails are loose or broken. Make sure that both sides of any steps have handrails.  · Install guardrails along the edges of any raised decks and porches.  · Clear walking paths of anything that might make someone trip, such as tools or rocks.  · Have any leaves, snow, or ice cleared regularly.  · Use sand or salt on walking paths during winter.  · Clean up any spills in your garage right  away. This includes grease or oil spills.  What other actions can I take?  · Wear shoes that:  ? Have a low heel. Do not wear high heels.  ? Have rubber bottoms.  ? Are comfortable and fit you well.  ? Are closed at the toe. Do not wear open-toe sandals.  · Use tools that help you move around (mobility aids) if they are needed. These include:  ? Canes.  ? Walkers.  ? Scooters.  ? Crutches.  · Review your medicines with your doctor. Some medicines can make you feel dizzy. This can increase your chance of falling.  Ask your doctor what other things you can do to help prevent falls.  Where to find more information  · Centers for Disease Control and Prevention, [employer]: https://cdc.gov  · National Institute on Aging: https://go4life.nia.nih.gov  Contact a doctor if:  · You are afraid of falling at home.  · You feel weak, drowsy, or dizzy at home.  · You fall at home.  Summary  · There are many simple things that you can do to make your home safe and to help prevent falls.  · Ways to make your home safe include removing tripping hazards and installing grab bars in the bathroom.  · Ask for help when making these changes in your home.  This information is not intended to replace advice given to you by your health care provider. Make sure you discuss any questions you have with your health care provider.  Document Released: 02/12/2009 Document Revised: 12/01/2016 Document Reviewed: 12/01/2016  Elsevier Interactive Patient Education © 2019 [employer]

## 2018-07-05 NOTE — Addendum Note (Signed)
Addended by: Rolena Infante on: 07/05/2018 11:45 AM   Modules accepted: Orders

## 2018-09-10 ENCOUNTER — Telehealth: Payer: Self-pay | Admitting: Nurse Practitioner

## 2018-09-17 ENCOUNTER — Encounter: Payer: Medicare HMO | Admitting: *Deleted

## 2018-09-19 ENCOUNTER — Other Ambulatory Visit: Payer: Self-pay

## 2018-09-19 ENCOUNTER — Ambulatory Visit (INDEPENDENT_AMBULATORY_CARE_PROVIDER_SITE_OTHER): Payer: Medicare HMO | Admitting: *Deleted

## 2018-09-19 VITALS — BP 122/71 | HR 71 | Temp 97.0°F | Ht 68.0 in | Wt 215.0 lb

## 2018-09-19 DIAGNOSIS — Z0000 Encounter for general adult medical examination without abnormal findings: Secondary | ICD-10-CM | POA: Diagnosis not present

## 2018-09-19 NOTE — Progress Notes (Addendum)
Subjective:   David Garza is a 68 y.o. male who presents for Medicare Annual/Subsequent preventive examination. 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 He enjoys drag-racing and going to cruise-ins in his free time. 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 He states that he eats semi-healthy and typically eats 3 meals a day with a bedtime snack. He is active in church and he lives at home with his wife. He has one daughter and she also lives with them. He has one dog and many cows. We discussed fall hazards at length and he states that his health is better than it was a year ago.   Cardiac Risk Factors include: advanced age (>51men, >63 women);male gender     Objective:    Vitals: BP 122/71 (BP Location: Left Arm)   Pulse 71   Temp (!) 97 F (36.1 C) (Oral)   Ht 5\' 8"  (1.727 m)   Wt 215 lb (97.5 kg)   BMI 32.69 kg/m   Body mass index is 32.69 kg/m.  Advanced Directives 09/19/2018 05/16/2014  Does Patient Have a Medical Advance Directive? No No  Would patient like information on creating a medical advance directive? No - Patient declined Yes - Scientist, clinical (histocompatibility and immunogenetics) given    Tobacco Social History   Tobacco Use  Smoking Status Former Smoker  . Types: Cigarettes  . Last attempt to quit: 04/01/1998  . Years since quitting: 20.4  Smokeless Tobacco Never Used     Counseling given: Not Answered   Past Medical History:  Diagnosis Date  . Hyperlipidemia   . Hypertension   . Metabolic syndrome   . Stroke [facility]) 06/2016   Past Surgical History:  Procedure Laterality Date  . KNEE ARTHROSCOPY Right   . SHOULDER SURGERY Right    Family History  Problem Relation Age of Onset  . Cancer Mother        pancreatic  . Alzheimer's disease Mother 53  . Diabetes Sister   . Aneurysm Sister        questionable - possible heart attack   . Coronary artery disease Maternal Grandfather   . Alcohol abuse Maternal  Grandfather   . GI Disease Maternal Grandfather   . Early death Father   . Alzheimer's disease Paternal Uncle 9  . Aneurysm Paternal Uncle   . Cancer Sister        male   Social History   Socioeconomic History  . Marital status: Married    Spouse name: [name]   . Number of children: 1  . Years of education: 83  . Highest education level: High school graduate  Occupational History  . Occupation: Retired    Comment: continues to farm   Social Needs  . Financial resource strain: Not hard at all  . Food insecurity:    Worry: Never true    Inability: Never true  . Transportation needs:    Medical: No    Non-medical: No  Tobacco Use  . Smoking status: Former Smoker    Types: Cigarettes    Last attempt to quit: 04/01/1998    Years since quitting: 20.4  . Smokeless tobacco: Never Used  Substance and Sexual Activity  . Alcohol use: No  . Drug use: No  . Sexual activity: Not on file  Lifestyle  . Physical activity:    Days per week: 7 days    Minutes per session: 60 min  .  Stress: Not on file  Relationships  . Social connections:    Talks on phone: More than three times a week    Gets together: More than three times a week    Attends religious service: More than 4 times per year    Active member of club or organization: No    Attends meetings of clubs or organizations: Never    Relationship status: Married  Other Topics Concern  . Not on file  Social History Narrative   Lives at home with Wife [name] and daughter [name] lives at home     Outpatient Encounter Medications as of 09/19/2018  Medication Sig  . clopidogrel (PLAVIX) 75 MG tablet Take 1 tablet (75 mg total) by mouth daily.  . DULoxetine (CYMBALTA) 60 MG capsule Take 1 capsule (60 mg total) by mouth daily.  . fish oil-omega-3 fatty acids 1000 MG capsule Take 2 g by mouth daily.  Marland Kitchen gabapentin (NEURONTIN) 100 MG capsule Take 2 capsules (200 mg total) by mouth 2 (two) times daily.  Marland Kitchen  lisinopril-hydrochlorothiazide (PRINZIDE,ZESTORETIC) 20-25 MG tablet Take 1 tablet by mouth daily.  Marland Kitchen omeprazole (PRILOSEC) 40 MG capsule Take 1 capsule (40 mg total) by mouth daily.  . pravastatin (PRAVACHOL) 80 MG tablet Take 1 tablet (80 mg total) by mouth daily.   No facility-administered encounter medications on file as of 09/19/2018.     Activities of Daily Living In your present state of health, do you have any difficulty performing the following activities: 09/19/2018  Hearing? N  Vision? N  Difficulty concentrating or making decisions? N  Walking or climbing stairs? N  Dressing or bathing? N  Doing errands, shopping? N  Preparing Food and eating ? N  Using the Toilet? N  In the past six months, have you accidently leaked urine? N  Do you have problems with loss of bowel control? N  Managing your Medications? N  Managing your Finances? N  Housekeeping or managing your Housekeeping? N  Some recent data might be hidden    Patient Care Team: [name], FNP as PCP - General (Nurse Practitioner) Burke Keels, MD (Surgery) Martinique, Amy, MD as Consulting Physician (Dermatology)   Assessment:   This is a routine wellness examination for David Garza.  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 Prevent falls     [name] active        Fall Risk Fall Risk  09/19/2018 07/05/2018 04/04/2018 03/17/2018 12/29/2017  Falls in the past year? 0 0 0 0 No   Is the patient's home free of loose throw rugs in walkways, pet beds, electrical cords, etc? Discussed at length today    Depression Screen PHQ 2/9 Scores 09/19/2018 07/05/2018 04/04/2018 03/17/2018  PHQ - 2 Score 0 0 0 0    Cognitive Function MMSE - Mini Mental State Exam 09/13/2017  Orientation to time 5  Orientation to Place 5   Registration 3  Attention/ Calculation 5  Recall 2  Language- name 2 objects 2  Language- repeat 1  Language- follow 3 step command 3  Language- read & follow direction 1  Write a sentence 1  Copy design 1  Total score 29     6CIT Screen 09/19/2018  What Year? 0 points  What month? 0 points  What time? 0 points  Count back from 20 0  points  Months in reverse 0 points  Repeat phrase 0 points  Total Score 0    Immunization History  Administered Date(s) Administered  . Influenza, High Dose Seasonal PF 01/24/2018  . Influenza,inj,Quad PF,6+ Mos 03/19/2013, 02/03/2014, 01/29/2015, 02/03/2017  . Influenza-Unspecified 02/11/2016  . Pneumococcal Conjugate-13 03/14/2017  . Pneumococcal Polysaccharide-23 07/05/2018  . Tdap 09/17/2012  . Zoster 05/16/2014    Qualifies for Shingles Vaccine? Discussed - had Zostavax   Screening Tests Health Maintenance  Topic Date Due  . INFLUENZA VACCINE  12/01/2018  . TETANUS/TDAP  09/18/2022  . COLONOSCOPY  05/06/2023  . Hepatitis C Screening  Completed  . PNA vac Low Risk Adult  Completed   Cancer Screenings: Lung: Low Dose CT Chest recommended if Age 53-80 years, 30 pack-year currently smoking OR have quit w/in 15years. Patient does not qualify. Colorectal: up to date   Additional Screenings: declines  Hepatitis C Screening:      Plan:   pt is to keep follow up with MMM and other specialist. He is to stay active and try to prevent falls.   I have personally reviewed and noted the following in the patient's chart:   . Medical and social history . Use of alcohol, tobacco or illicit drugs  . Current medications and supplements . Functional ability and status . Nutritional status . Physical activity . Advanced directives . List of other physicians . Hospitalizations, surgeries, and ER visits in previous 12 months . Vitals . Screenings to include cognitive, depression, and falls . Referrals and appointments  In addition, I  have reviewed and discussed with patient certain preventive protocols, quality metrics, and best practice recommendations. A written personalized care plan for preventive services as well as general preventive health recommendations were provided to patient.     , Cameron Proud, LPN  6/41/5830  I have reviewed and agree with the above AWV documentation.   Mary-Margaret Hassell Done, FNP

## 2018-09-19 NOTE — Patient Instructions (Signed)
  David Garza , Thank you for taking time to come for your Medicare Wellness Visit. I appreciate your ongoing commitment to your health goals. Please review the following plan we discussed and let me know if I can assist you in the future.   These are the goals we discussed: Goals    . Prevent falls     [place] Board active        This is a list of the screening recommended for you and due dates:  Health Maintenance  Topic Date Due  . Flu Shot  12/01/2018  . Tetanus Vaccine  09/18/2022  . Colon Cancer Screening  05/06/2023  .  Hepatitis C: One time screening is recommended by Center for Disease Control  (CDC) for  adults born from [birth year] through 1964.   Completed  . Pneumonia vaccines  Completed     Keep follow up with Ronnald Collum and other specialist  Do not put yourself at risk for falls. Stay active

## 2018-10-23 DIAGNOSIS — Z85828 Personal history of other malignant neoplasm of skin: Secondary | ICD-10-CM | POA: Diagnosis not present

## 2018-10-23 DIAGNOSIS — L738 Other specified follicular disorders: Secondary | ICD-10-CM | POA: Diagnosis not present

## 2018-10-23 DIAGNOSIS — C44319 Basal cell carcinoma of skin of other parts of face: Secondary | ICD-10-CM | POA: Diagnosis not present

## 2018-10-23 DIAGNOSIS — L570 Actinic keratosis: Secondary | ICD-10-CM | POA: Diagnosis not present

## 2018-10-23 DIAGNOSIS — L821 Other seborrheic keratosis: Secondary | ICD-10-CM | POA: Diagnosis not present

## 2018-10-23 DIAGNOSIS — D485 Neoplasm of uncertain behavior of skin: Secondary | ICD-10-CM | POA: Diagnosis not present

## 2018-10-23 DIAGNOSIS — D2262 Melanocytic nevi of left upper limb, including shoulder: Secondary | ICD-10-CM | POA: Diagnosis not present

## 2018-10-23 DIAGNOSIS — D225 Melanocytic nevi of trunk: Secondary | ICD-10-CM | POA: Diagnosis not present

## 2018-10-25 ENCOUNTER — Other Ambulatory Visit: Payer: Medicare HMO

## 2018-10-25 ENCOUNTER — Other Ambulatory Visit: Payer: Self-pay

## 2018-10-25 DIAGNOSIS — E782 Mixed hyperlipidemia: Secondary | ICD-10-CM | POA: Diagnosis not present

## 2018-10-25 DIAGNOSIS — I10 Essential (primary) hypertension: Secondary | ICD-10-CM

## 2018-10-25 LAB — LIPID PANEL

## 2018-10-26 ENCOUNTER — Other Ambulatory Visit: Payer: Self-pay

## 2018-10-26 LAB — LIPID PANEL
Chol/HDL Ratio: 4.5 ratio (ref 0.0–5.0)
Cholesterol, Total: 134 mg/dL (ref 100–199)
HDL: 30 mg/dL — ABNORMAL LOW (ref >39)
LDL Calculated: 80 mg/dL (ref 0–99)
Triglycerides: 122 mg/dL (ref 0–149)
VLDL Cholesterol Cal: 24 mg/dL (ref 5–40)

## 2018-10-26 LAB — CMP14+EGFR
ALT: 14 IU/L (ref 0–44)
AST: 13 IU/L (ref 0–40)
Albumin/Globulin Ratio: 2.3 — ABNORMAL HIGH (ref 1.2–2.2)
Albumin: 4.5 g/dL (ref 3.8–4.8)
Alkaline Phosphatase: 47 IU/L (ref 39–117)
BUN/Creatinine Ratio: 12 (ref 10–24)
BUN: 13 mg/dL (ref 8–27)
Bilirubin Total: 0.8 mg/dL (ref 0.0–1.2)
CO2: 26 mmol/L (ref 20–29)
Calcium: 9.6 mg/dL (ref 8.6–10.2)
Chloride: 99 mmol/L (ref 96–106)
Creatinine, Ser: 1.11 mg/dL (ref 0.76–1.27)
GFR calc Af Amer: 80 mL/min/{1.73_m2} (ref >59)
GFR calc non Af Amer: 69 mL/min/{1.73_m2} (ref >59)
Globulin, Total: 2 g/dL (ref 1.5–4.5)
Glucose: 110 mg/dL — ABNORMAL HIGH (ref 65–99)
Potassium: 3.9 mmol/L (ref 3.5–5.2)
Sodium: 138 mmol/L (ref 134–144)
Total Protein: 6.5 g/dL (ref 6.0–8.5)

## 2018-10-29 ENCOUNTER — Other Ambulatory Visit: Payer: Self-pay

## 2018-10-29 ENCOUNTER — Ambulatory Visit (INDEPENDENT_AMBULATORY_CARE_PROVIDER_SITE_OTHER): Payer: Medicare HMO | Admitting: Nurse Practitioner

## 2018-10-29 ENCOUNTER — Encounter: Payer: Self-pay | Admitting: Nurse Practitioner

## 2018-10-29 VITALS — BP 146/86 | HR 80 | Temp 97.2°F | Ht 68.0 in | Wt 216.0 lb

## 2018-10-29 DIAGNOSIS — E781 Pure hyperglyceridemia: Secondary | ICD-10-CM

## 2018-10-29 DIAGNOSIS — Z6833 Body mass index (BMI) 33.0-33.9, adult: Secondary | ICD-10-CM | POA: Diagnosis not present

## 2018-10-29 DIAGNOSIS — M25561 Pain in right knee: Secondary | ICD-10-CM

## 2018-10-29 DIAGNOSIS — I10 Essential (primary) hypertension: Secondary | ICD-10-CM

## 2018-10-29 DIAGNOSIS — E782 Mixed hyperlipidemia: Secondary | ICD-10-CM | POA: Diagnosis not present

## 2018-10-29 DIAGNOSIS — G63 Polyneuropathy in diseases classified elsewhere: Secondary | ICD-10-CM | POA: Diagnosis not present

## 2018-10-29 DIAGNOSIS — I6930 Unspecified sequelae of cerebral infarction: Secondary | ICD-10-CM | POA: Diagnosis not present

## 2018-10-29 DIAGNOSIS — K219 Gastro-esophageal reflux disease without esophagitis: Secondary | ICD-10-CM | POA: Diagnosis not present

## 2018-10-29 DIAGNOSIS — M1A0710 Idiopathic chronic gout, right ankle and foot, without tophus (tophi): Secondary | ICD-10-CM | POA: Diagnosis not present

## 2018-10-29 MED ORDER — METHYLPREDNISOLONE ACETATE 40 MG/ML IJ SUSP
40.0000 mg | Freq: Once | INTRAMUSCULAR | Status: AC
  Administered 2018-10-29: 40 mg via INTRAMUSCULAR

## 2018-10-29 MED ORDER — CLOPIDOGREL BISULFATE 75 MG PO TABS: 75.0000 mg | ORAL_TABLET | Freq: Every day | ORAL | 1 refills | Status: DC

## 2018-10-29 MED ORDER — BUPIVACAINE HCL 0.25 % IJ SOLN
1.0000 mL | Freq: Once | INTRAMUSCULAR | Status: AC
  Administered 2018-10-29: 1 mL via INTRA_ARTICULAR

## 2018-10-29 MED ORDER — DULOXETINE HCL 60 MG PO CPEP: 60.0000 mg | ORAL_CAPSULE | Freq: Every day | ORAL | 1 refills | Status: DC

## 2018-10-29 MED ORDER — PRAVASTATIN SODIUM 80 MG PO TABS: 80.0000 mg | ORAL_TABLET | Freq: Every day | ORAL | 1 refills | Status: DC

## 2018-10-29 MED ORDER — GABAPENTIN 100 MG PO CAPS: 200.0000 mg | ORAL_CAPSULE | Freq: Two times a day (BID) | ORAL | 1 refills | Status: DC

## 2018-10-29 MED ORDER — LISINOPRIL-HYDROCHLOROTHIAZIDE 20-25 MG PO TABS: 1.0000 | ORAL_TABLET | Freq: Every day | ORAL | 1 refills | Status: DC

## 2018-10-29 MED ORDER — OMEPRAZOLE 40 MG PO CPDR: 40.0000 mg | DELAYED_RELEASE_CAPSULE | Freq: Every day | ORAL | 1 refills | Status: DC

## 2018-10-29 NOTE — Progress Notes (Signed)
Subjective:    Patient ID: David Garza, male    DOB: March 19, 1951, 68 y.o.   MRN: [id number]   Chief Complaint: Medical Management of Chronic Issues    HPI:  1. Essential hypertension no c/o chest pain, sob or headache. daoes not check blood pressure at home. BP Readings from Last 3 Encounters:  10/29/18 (!) 146/86  09/19/18 122/71  07/05/18 129/88     2. Pure hypertriglyceridemia Wife makes sure he watches his diet when he is at home. 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  3. Gastroesophageal reflux disease without esophagitis Takes omeprazole which works well when he needs it. He doe snot take meds everyday   4. Late effect of cerebrovascular accident (CVA) Still has right sided tingling in hand. He has gotten most of his strength back  5. Neuropathy due to medical condition ([facility]) Has numbness in right hand from stroke. The neurontin helps  6. Idiopathic chronic gout of right foot without tophus No recent gout flare up  7. BMI 33.0-33.9,adult No recent weight changes    Outpatient Encounter Medications as of 10/29/2018  Medication Sig  . clopidogrel (PLAVIX) 75 MG tablet Take 1 tablet (75 mg total) by mouth daily.  . DULoxetine (CYMBALTA) 60 MG capsule Take 1 capsule (60 mg total) by mouth daily.  . fish oil-omega-3 fatty acids 1000 MG capsule Take 2 g by mouth daily.  Marland Kitchen gabapentin (NEURONTIN) 100 MG capsule Take 2 capsules (200 mg total) by mouth 2 (two) times daily.  Marland Kitchen lisinopril-hydrochlorothiazide (PRINZIDE,ZESTORETIC) 20-25 MG tablet Take 1 tablet by mouth daily.  Marland Kitchen omeprazole (PRILOSEC) 40 MG capsule Take 1 capsule (40 mg total) by mouth daily.  . pravastatin (PRAVACHOL) 80 MG tablet Take 1 tablet (80 mg total) by mouth daily.     Past Surgical History:  Procedure Laterality Date  . KNEE ARTHROSCOPY Right   . SHOULDER SURGERY Right     Family History  Problem Relation Age of Onset  . Cancer Mother        pancreatic  . Alzheimer's disease Mother 22   . Diabetes Sister   . Aneurysm Sister        questionable - possible heart attack   . Coronary artery disease Maternal Grandfather   . Alcohol abuse Maternal Grandfather   . GI Disease Maternal Grandfather   . Early death Father   . Alzheimer's disease Paternal Uncle 25  . Aneurysm Paternal Uncle   . Cancer Sister        male    New complaints: Right knee started bothering him yesterday. He has known arthritis in it. Has had arthroscopic surgery on it in the past. Has had steroid injections in the past.  Social history: Lives with wife on a farm.     Review of Systems  Constitutional: Negative for activity change and appetite change.  HENT: Negative.   Eyes: Negative for pain.  Respiratory: Negative for shortness of breath.   Cardiovascular: Negative for chest pain, palpitations and leg swelling.  Gastrointestinal: Negative for abdominal pain.  Endocrine: Negative for polydipsia.  Genitourinary: Negative.   Musculoskeletal: Positive for arthralgias (right knee).  Skin: Negative for rash.  Neurological: Negative for dizziness, weakness and headaches.  Hematological: Does not bruise/bleed easily.  Psychiatric/Behavioral: Negative.   All other systems reviewed and are negative.       Objective:   Physical Exam Vitals signs and nursing note reviewed.  Constitutional:      Appearance: Normal appearance. He is well-developed.  HENT:     Head: Normocephalic.     Nose: Nose normal.  Eyes:     Pupils: Pupils are equal, round, and reactive to light.  Neck:     Musculoskeletal: Normal range of motion and neck supple.     Thyroid: No thyroid mass or thyromegaly.     Vascular: No carotid bruit or JVD.     Trachea: Phonation normal.  Cardiovascular:     Rate and Rhythm: Normal rate and regular rhythm.  Pulmonary:     Effort: Pulmonary effort is normal. No respiratory distress.     Breath sounds: Normal breath sounds.  Abdominal:     General: Bowel sounds are normal.      Palpations: Abdomen is soft.     Tenderness: There is no abdominal tenderness.  Musculoskeletal: Normal range of motion.  Lymphadenopathy:     Cervical: No cervical adenopathy.  Skin:    General: Skin is warm and dry.  Neurological:     Mental Status: He is alert and oriented to person, place, and time.  Psychiatric:        Behavior: Behavior normal.        Thought Content: Thought content normal.        Judgment: Judgment normal.    BP (!) 146/86   Pulse 80   Temp (!) 97.2 F (36.2 C) (Oral)   Ht 5\' 8"  (1.727 m)   Wt 216 lb (98 kg)   BMI 32.84 kg/m   Joint Injection/Arthrocentesis  Date/Time: 10/29/2018 8:52 AM Performed by: [name], FNP Authorized by: Hassell Done Mary-Margaret, FNP  Indications: pain  Body area: knee Joint: right knee Local anesthesia used: no  Anesthesia: Local anesthesia used: no  Sedation: Patient sedated: no  Preparation: Patient was prepped and draped in the usual sterile fashion. Needle size: 22 G Ultrasound guidance: no Approach: lateral Methylprednisolone amount: 40 mg Bupivacaine 0.50% amount: 1 mL Patient tolerance: patient tolerated the procedure well with no immediate complications          Assessment & Plan:  [name] comes in today with chief complaint of Medical Management of Chronic Issues   Diagnosis and orders addressed:  1. Essential hypertension Low sodium diet - lisinopril-hydrochlorothiazide (ZESTORETIC) 20-25 MG tablet; Take 1 tablet by mouth daily.  Dispense: 90 tablet; Refill: 1  2. Pure hypertriglyceridemia Low far diet  3. Gastroesophageal reflux disease without esophagitis Avoid spicy foods Do not eat 2 hours prior to bedtime - omeprazole (PRILOSEC) 40 MG capsule; Take 1 capsule (40 mg total) by mouth daily.  Dispense: 90 capsule; Refill: 1  4. Late effect of cerebrovascular accident (CVA) Fall precautions - clopidogrel (PLAVIX) 75 MG tablet; Take 1 tablet (75 mg total) by  mouth daily.  Dispense: 90 tablet; Refill: 1  5. Neuropathy due to medical condition ([facility]) - DULoxetine (CYMBALTA) 60 MG capsule; Take 1 capsule (60 mg total) by mouth daily.  Dispense: 90 capsule; Refill: 1 - gabapentin (NEURONTIN) 100 MG capsule; Take 2 capsules (200 mg total) by mouth 2 (two) times daily.  Dispense: 180 capsule; Refill: 1  6. Idiopathic chronic gout of right foot without tophus Low purine diet  7. BMI 33.0-33.9,adult Discussed diet and exercise for person with BMI >25 Will recheck weight in 3-6 months  8. Acute pain of right knee Right knee steriod injection - methylPREDNISolone acetate (DEPO-MEDROL) injection 40 mg - bupivacaine (MARCAINE) 0.25 % (with pres) injection 1 mL  9. Mixed hyperlipidemia Low fat diet - pravastatin (PRAVACHOL) 80  MG tablet; Take 1 tablet (80 mg total) by mouth daily.  Dispense: 90 tablet; Refill: 1   Labs pending Health Maintenance reviewed Diet and exercise encouraged  Follow up plan: 3 months   Mary-Margaret Hassell Done, FNP

## 2018-11-14 DIAGNOSIS — Z85828 Personal history of other malignant neoplasm of skin: Secondary | ICD-10-CM | POA: Diagnosis not present

## 2018-11-14 DIAGNOSIS — C441192 Basal cell carcinoma of skin of left lower eyelid, including canthus: Secondary | ICD-10-CM | POA: Diagnosis not present

## 2018-11-14 DIAGNOSIS — C44319 Basal cell carcinoma of skin of other parts of face: Secondary | ICD-10-CM | POA: Diagnosis not present

## 2018-12-17 ENCOUNTER — Ambulatory Visit (INDEPENDENT_AMBULATORY_CARE_PROVIDER_SITE_OTHER): Payer: Medicare HMO | Admitting: Family Medicine

## 2018-12-17 ENCOUNTER — Other Ambulatory Visit: Payer: Self-pay

## 2018-12-17 ENCOUNTER — Encounter: Payer: Self-pay | Admitting: Family Medicine

## 2018-12-17 DIAGNOSIS — R42 Dizziness and giddiness: Secondary | ICD-10-CM | POA: Diagnosis not present

## 2018-12-17 MED ORDER — MECLIZINE HCL 12.5 MG PO TABS: 12.5000 mg | ORAL_TABLET | Freq: Three times a day (TID) | ORAL | 0 refills | Status: DC | PRN

## 2018-12-17 MED ORDER — FLUTICASONE PROPIONATE 50 MCG/ACT NA SUSP: 2.0000 | Freq: Every day | NASAL | 6 refills | Status: DC

## 2018-12-17 NOTE — Progress Notes (Signed)
Virtual Visit via telephone Note Due to COVID-19 pandemic this visit was conducted virtually. This visit type was conducted due to national recommendations for restrictions regarding the COVID-19 Pandemic (e.g. social distancing, sheltering in place) in an effort to limit this patient's exposure and mitigate transmission in our community. All issues noted in this document were discussed and addressed.  A physical exam was not performed with this format.   I connected with David Garza on 12/17/18 at 0830 by telephone and verified that I am speaking with the correct person using two identifiers. David Garza is currently located at home and family is currently with them during visit. The provider, Monia Pouch, FNP is located in their office at time of visit.  I discussed the limitations, risks, security and privacy concerns of performing an evaluation and management service by telephone and the availability of in person appointments. I also discussed with the patient that there may be a patient responsible charge related to this service. The patient expressed understanding and agreed to proceed.  Subjective:  Patient ID: David Garza, male    DOB: 08-06-1950, 68 y.o.   MRN: [id number]  Chief Complaint:  Dizziness   HPI: David Garza is a 68 y.o. male presenting on 12/17/2018 for Dizziness   Pt reports intermittent dizziness for over a week. States the dizziness happens when he changes positions quickly and when he rolls over in the bed. States he has had something similar in the past and had fluid behind his eardrum. Pt states he does not have any weakness, focal deficits, loss of function, confusion, slurred speech, or facial droop.   Dizziness This is a new problem. The problem occurs intermittently. The problem has been waxing and waning. Associated symptoms include nausea and vertigo. Pertinent negatives include no abdominal pain, anorexia, arthralgias, change in bowel  habit, chest pain, chills, congestion, coughing, diaphoresis, fatigue, fever, headaches, joint swelling, myalgias, neck pain, numbness, rash, sore throat, swollen glands, urinary symptoms, visual change, vomiting or weakness. The symptoms are aggravated by bending, standing and twisting. He has tried drinking for the symptoms. The treatment provided no relief.     Relevant past medical, surgical, family, and social history reviewed and updated as indicated.  Allergies and medications reviewed and updated.   Past Medical History:  Diagnosis Date  . Hyperlipidemia   . Hypertension   . Metabolic syndrome   . Stroke [facility]) 06/2016    Past Surgical History:  Procedure Laterality Date  . KNEE ARTHROSCOPY Right   . SHOULDER SURGERY Right     Social History   Socioeconomic History  . Marital status: Married    Spouse name: [name]   . Number of children: 1  . Years of education: 75  . Highest education level: High school graduate  Occupational History  . Occupation: Retired    Comment: continues to farm   Social Needs  . Financial resource strain: Not hard at all  . Food insecurity    Worry: Never true    Inability: Never true  . Transportation needs    Medical: No    Non-medical: No  Tobacco Use  . Smoking status: Former Smoker    Types: Cigarettes    Quit date: 04/01/1998    Years since quitting: 20.7  . Smokeless tobacco: Never Used  Substance and Sexual Activity  . Alcohol use: No  . Drug use: No  . Sexual activity: Not on file  Lifestyle  . Physical activity  Days per week: 7 days    Minutes per session: 60 min  . Stress: Not on file  Relationships  . Social connections    Talks on phone: More than three times a week    Gets together: More than three times a week    Attends religious service: More than 4 times per year    Active member of club or organization: No    Attends meetings of clubs or organizations: Never    Relationship status: Married  . Intimate  partner violence    Fear of current or ex partner: No    Emotionally abused: No    Physically abused: No    Forced sexual activity: No  Other Topics Concern  . Not on file  Social History Narrative   Lives at home with Wife [name] and daughter [name] lives at home     Outpatient Encounter Medications as of 12/17/2018  Medication Sig  . clopidogrel (PLAVIX) 75 MG tablet Take 1 tablet (75 mg total) by mouth daily.  . DULoxetine (CYMBALTA) 60 MG capsule Take 1 capsule (60 mg total) by mouth daily.  . fish oil-omega-3 fatty acids 1000 MG capsule Take 2 g by mouth daily.  . fluticasone (FLONASE) 50 MCG/ACT nasal spray Place 2 sprays into both nostrils daily.  Marland Kitchen gabapentin (NEURONTIN) 100 MG capsule Take 2 capsules (200 mg total) by mouth 2 (two) times daily.  Marland Kitchen lisinopril-hydrochlorothiazide (ZESTORETIC) 20-25 MG tablet Take 1 tablet by mouth daily.  . meclizine (ANTIVERT) 12.5 MG tablet Take 1 tablet (12.5 mg total) by mouth 3 (three) times daily as needed for dizziness.  Marland Kitchen omeprazole (PRILOSEC) 40 MG capsule Take 1 capsule (40 mg total) by mouth daily.  . pravastatin (PRAVACHOL) 80 MG tablet Take 1 tablet (80 mg total) by mouth daily.   No facility-administered encounter medications on file as of 12/17/2018.     Allergies  Allergen Reactions  . Meloxicam Hypertension  . Nsaids Hypertension    Review of Systems  Constitutional: Negative for activity change, appetite change, chills, diaphoresis, fatigue, fever and unexpected weight change.  HENT: Negative for congestion and sore throat.   Eyes: Negative for photophobia and visual disturbance.  Respiratory: Negative for cough, chest tightness and shortness of breath.   Cardiovascular: Negative for chest pain, palpitations and leg swelling.  Gastrointestinal: Positive for nausea. Negative for abdominal pain, anal bleeding, anorexia, blood in stool, change in bowel habit and vomiting.  Genitourinary: Negative for decreased urine volume,  difficulty urinating and hematuria.  Musculoskeletal: Negative for arthralgias, joint swelling, myalgias and neck pain.  Skin: Negative for color change, pallor and rash.  Neurological: Positive for dizziness and vertigo. Negative for tremors, seizures, syncope, facial asymmetry, speech difficulty, weakness, light-headedness, numbness and headaches.  Psychiatric/Behavioral: Negative for confusion.  All other systems reviewed and are negative.        Observations/Objective: No vital signs or physical exam, this was a telephone or virtual health encounter.  Pt alert and oriented, answers all questions appropriately, and able to speak in full sentences.    Assessment and Plan: Claudis was seen today for dizziness.  Diagnoses and all orders for this visit:  Vertigo Reported symptoms consistent with vertigo. No red flags concerning for acute neurovascular event. Pt aware of symptoms that warrant emergent evaluation. Pt aware to stay adequately hydrated and to make slow position changes. Will trial below. Report any new or worsening symptoms. Follow up in 1-2 weeks for reevaluation or sooner if needed.  -  meclizine (ANTIVERT) 12.5 MG tablet; Take 1 tablet (12.5 mg total) by mouth 3 (three) times daily as needed for dizziness. -     fluticasone (FLONASE) 50 MCG/ACT nasal spray; Place 2 sprays into both nostrils daily.     Follow Up Instructions: Return in about 2 weeks (around 12/31/2018), or if symptoms worsen or fail to improve, for Vertigo.    I discussed the assessment and treatment plan with the patient. The patient was provided an opportunity to ask questions and all were answered. The patient agreed with the plan and demonstrated an understanding of the instructions.   The patient was advised to call back or seek an in-person evaluation if the symptoms worsen or if the condition fails to improve as anticipated.  The above assessment and management plan was discussed with the  patient. The patient verbalized understanding of and has agreed to the management plan. Patient is aware to call the clinic if symptoms persist or worsen. Patient is aware when to return to the clinic for a follow-up visit. Patient educated on when it is appropriate to go to the emergency department.    I provided 15 minutes of non-face-to-face time during this encounter. The call started at 0830. The call ended at [place]. The other time was used for coordination of care.    Monia Pouch, FNP-C [facility] [id number] [place], [place] [address] [phone] 12/17/18

## 2018-12-25 ENCOUNTER — Ambulatory Visit: Payer: Medicare HMO | Admitting: Family Medicine

## 2019-01-28 ENCOUNTER — Other Ambulatory Visit: Payer: Medicare HMO

## 2019-01-28 ENCOUNTER — Other Ambulatory Visit: Payer: Self-pay

## 2019-01-28 DIAGNOSIS — K219 Gastro-esophageal reflux disease without esophagitis: Secondary | ICD-10-CM

## 2019-01-28 DIAGNOSIS — E781 Pure hyperglyceridemia: Secondary | ICD-10-CM | POA: Diagnosis not present

## 2019-01-28 DIAGNOSIS — I10 Essential (primary) hypertension: Secondary | ICD-10-CM

## 2019-01-29 LAB — CBC WITH DIFFERENTIAL/PLATELET
Basophils Absolute: 0.1 10*3/uL (ref 0.0–0.2)
Basos: 1 %
EOS (ABSOLUTE): 0.3 10*3/uL (ref 0.0–0.4)
Eos: 4 %
Hematocrit: 47.4 % (ref 37.5–51.0)
Hemoglobin: 16.3 g/dL (ref 13.0–17.7)
Immature Grans (Abs): 0 10*3/uL (ref 0.0–0.1)
Immature Granulocytes: 0 %
Lymphocytes Absolute: 1.4 10*3/uL (ref 0.7–3.1)
Lymphs: 18 %
MCH: 30 pg (ref 26.6–33.0)
MCHC: 34.4 g/dL (ref 31.5–35.7)
MCV: 87 fL (ref 79–97)
Monocytes Absolute: 0.5 10*3/uL (ref 0.1–0.9)
Monocytes: 7 %
Neutrophils Absolute: 5.2 10*3/uL (ref 1.4–7.0)
Neutrophils: 70 %
Platelets: 199 10*3/uL (ref 150–450)
RBC: 5.43 x10E6/uL (ref 4.14–5.80)
RDW: 12.8 % (ref 11.6–15.4)
WBC: 7.5 10*3/uL (ref 3.4–10.8)

## 2019-01-29 LAB — CMP14+EGFR
ALT: 16 IU/L (ref 0–44)
AST: 18 IU/L (ref 0–40)
Albumin/Globulin Ratio: 2 (ref 1.2–2.2)
Albumin: 4.4 g/dL (ref 3.8–4.8)
Alkaline Phosphatase: 47 IU/L (ref 39–117)
BUN/Creatinine Ratio: 9 — ABNORMAL LOW (ref 10–24)
BUN: 10 mg/dL (ref 8–27)
Bilirubin Total: 0.5 mg/dL (ref 0.0–1.2)
CO2: 25 mmol/L (ref 20–29)
Calcium: 9.6 mg/dL (ref 8.6–10.2)
Chloride: 102 mmol/L (ref 96–106)
Creatinine, Ser: 1.07 mg/dL (ref 0.76–1.27)
GFR calc Af Amer: 83 mL/min/{1.73_m2} (ref >59)
GFR calc non Af Amer: 72 mL/min/{1.73_m2} (ref >59)
Globulin, Total: 2.2 g/dL (ref 1.5–4.5)
Glucose: 109 mg/dL — ABNORMAL HIGH (ref 65–99)
Potassium: 4 mmol/L (ref 3.5–5.2)
Sodium: 141 mmol/L (ref 134–144)
Total Protein: 6.6 g/dL (ref 6.0–8.5)

## 2019-01-29 LAB — LIPID PANEL
Chol/HDL Ratio: 4.9 ratio (ref 0.0–5.0)
Cholesterol, Total: 151 mg/dL (ref 100–199)
HDL: 31 mg/dL — ABNORMAL LOW (ref >39)
LDL Chol Calc (NIH): 85 mg/dL (ref 0–99)
Triglycerides: 203 mg/dL — ABNORMAL HIGH (ref 0–149)
VLDL Cholesterol Cal: 35 mg/dL (ref 5–40)

## 2019-01-31 ENCOUNTER — Other Ambulatory Visit: Payer: Self-pay

## 2019-01-31 ENCOUNTER — Ambulatory Visit (INDEPENDENT_AMBULATORY_CARE_PROVIDER_SITE_OTHER): Payer: Medicare HMO | Admitting: Nurse Practitioner

## 2019-01-31 ENCOUNTER — Encounter: Payer: Self-pay | Admitting: Nurse Practitioner

## 2019-01-31 VITALS — BP 126/76 | HR 74 | Temp 97.8°F | Resp 20 | Ht 68.0 in | Wt 215.0 lb

## 2019-01-31 DIAGNOSIS — Z23 Encounter for immunization: Secondary | ICD-10-CM | POA: Diagnosis not present

## 2019-01-31 DIAGNOSIS — Z6833 Body mass index (BMI) 33.0-33.9, adult: Secondary | ICD-10-CM

## 2019-01-31 DIAGNOSIS — K219 Gastro-esophageal reflux disease without esophagitis: Secondary | ICD-10-CM | POA: Diagnosis not present

## 2019-01-31 DIAGNOSIS — E782 Mixed hyperlipidemia: Secondary | ICD-10-CM

## 2019-01-31 DIAGNOSIS — G63 Polyneuropathy in diseases classified elsewhere: Secondary | ICD-10-CM

## 2019-01-31 DIAGNOSIS — I6930 Unspecified sequelae of cerebral infarction: Secondary | ICD-10-CM | POA: Diagnosis not present

## 2019-01-31 DIAGNOSIS — M1A0710 Idiopathic chronic gout, right ankle and foot, without tophus (tophi): Secondary | ICD-10-CM

## 2019-01-31 DIAGNOSIS — I10 Essential (primary) hypertension: Secondary | ICD-10-CM | POA: Diagnosis not present

## 2019-01-31 DIAGNOSIS — E781 Pure hyperglyceridemia: Secondary | ICD-10-CM

## 2019-01-31 MED ORDER — GABAPENTIN 100 MG PO CAPS: 200.0000 mg | ORAL_CAPSULE | Freq: Two times a day (BID) | ORAL | 1 refills | Status: DC

## 2019-01-31 MED ORDER — OMEPRAZOLE 40 MG PO CPDR: 40.0000 mg | DELAYED_RELEASE_CAPSULE | Freq: Every day | ORAL | 1 refills | Status: DC

## 2019-01-31 MED ORDER — CLOPIDOGREL BISULFATE 75 MG PO TABS: 75.0000 mg | ORAL_TABLET | Freq: Every day | ORAL | 1 refills | Status: DC

## 2019-01-31 MED ORDER — PRAVASTATIN SODIUM 80 MG PO TABS: 80.0000 mg | ORAL_TABLET | Freq: Every day | ORAL | 1 refills | Status: DC

## 2019-01-31 MED ORDER — LISINOPRIL-HYDROCHLOROTHIAZIDE 20-25 MG PO TABS: 1.0000 | ORAL_TABLET | Freq: Every day | ORAL | 1 refills | Status: DC

## 2019-01-31 MED ORDER — DULOXETINE HCL 60 MG PO CPEP: 60.0000 mg | ORAL_CAPSULE | Freq: Every day | ORAL | 1 refills | Status: DC

## 2019-01-31 NOTE — Progress Notes (Signed)
Subjective:    Patient ID: David Garza, male    DOB: 01-15-1951, 68 y.o.   MRN: [id number]   Chief Complaint: medical management of chronic issues   HPI:  1. Essential hypertension No c/o chest pain, sob or headaches. Does not check blood pressure at home. BP Readings from Last 3 Encounters:  10/29/18 (!) 146/86  09/19/18 122/71  07/05/18 129/88     2. Pure hypertriglyceridemia Wife tries to encourage him to watch hi diet. 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 Lab Results  Component Value Date   CHOL 151 01/28/2019   HDL 31 (L) 01/28/2019   LDLCALC 85 01/28/2019   TRIG 203 (H) 01/28/2019   CHOLHDL 4.9 01/28/2019     3. Gastroesophageal reflux disease without esophagitis Is on omeprazole daily and works well for him most days. Has occasional heart burn.  4. Idiopathic chronic gout of right foot without tophus Denies any recent gout flare ups  5. Late effect of cerebrovascular accident (CVA) Had a stroke almost 2 years ago. 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  6. Neuropathy due to medical condition [name]) Has a lot of neuropathy in right hand due to stroke. The cymbalta and neuronytin seem to help him some.  7. BMI 33.0-33.9,adult No recent weight changes . Wt Readings from Last 3 Encounters:  01/31/19 215 lb (97.5 kg)  10/29/18 216 lb (98 kg)  09/19/18 215 lb (97.5 kg)   BMI Readings from Last 3 Encounters:  01/31/19 32.69 kg/m  10/29/18 32.84 kg/m  09/19/18 32.69 kg/m     Outpatient Encounter Medications as of 01/31/2019  Medication Sig  . clopidogrel (PLAVIX) 75 MG tablet Take 1 tablet (75 mg total) by mouth daily.  . DULoxetine (CYMBALTA) 60 MG capsule Take 1 capsule (60 mg total) by mouth daily.  . fish oil-omega-3 fatty acids 1000 MG capsule Take 2 g by mouth daily.  . fluticasone (FLONASE) 50 MCG/ACT nasal spray Place 2 sprays into both nostrils  daily.  Marland Kitchen gabapentin (NEURONTIN) 100 MG capsule Take 2 capsules (200 mg total) by mouth 2 (two) times daily.  Marland Kitchen lisinopril-hydrochlorothiazide (ZESTORETIC) 20-25 MG tablet Take 1 tablet by mouth daily.  . meclizine (ANTIVERT) 12.5 MG tablet Take 1 tablet (12.5 mg total) by mouth 3 (three) times daily as needed for dizziness.  Marland Kitchen omeprazole (PRILOSEC) 40 MG capsule Take 1 capsule (40 mg total) by mouth daily.  . pravastatin (PRAVACHOL) 80 MG tablet Take 1 tablet (80 mg total) by mouth daily.     Past Surgical History:  Procedure Laterality Date  . KNEE ARTHROSCOPY Right   . SHOULDER SURGERY Right     Family History  Problem Relation Age of Onset  . Cancer Mother        pancreatic  . Alzheimer's disease Mother 88  . Diabetes Sister   . Aneurysm Sister        questionable - possible heart attack   . Coronary artery disease Maternal Grandfather   . Alcohol abuse Maternal Grandfather   . GI Disease Maternal Grandfather   . Early death Father   . Alzheimer's disease Paternal Uncle 60  . Aneurysm Paternal Uncle   . 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  HENT: Negative.   Eyes: Negative for pain.  Respiratory: Negative for shortness of breath.   Cardiovascular: Negative for chest pain, palpitations and leg swelling.  Gastrointestinal: Negative for abdominal pain.  Endocrine: Negative for polydipsia.  Genitourinary: Negative.   Skin: Negative for rash.  Neurological: Negative for dizziness, weakness and headaches.  Hematological: Does not bruise/bleed easily.  Psychiatric/Behavioral: Negative.   All other systems reviewed and are negative.      Objective:   Physical Exam Vitals signs and nursing note reviewed.  Constitutional:      Appearance: Normal appearance. He is  well-developed.  HENT:     Head: Normocephalic.     Nose: Nose normal.  Eyes:     Pupils: Pupils are equal, round, and reactive to light.  Neck:     Musculoskeletal: Normal range of motion and neck supple.     Thyroid: No thyroid mass or thyromegaly.     Vascular: No carotid bruit or JVD.     Trachea: Phonation normal.  Cardiovascular:     Rate and Rhythm: Normal rate and regular rhythm.  Pulmonary:     Effort: Pulmonary effort is normal. No respiratory distress.     Breath sounds: Normal breath sounds.  Abdominal:     General: Bowel sounds are normal.     Palpations: Abdomen is soft.     Tenderness: There is no abdominal tenderness.  Musculoskeletal: Normal range of motion.  Lymphadenopathy:     Cervical: No cervical adenopathy.  Skin:    General: Skin is warm and dry.  Neurological:     Mental Status: He is alert and oriented to person, place, and time.  Psychiatric:        Behavior: Behavior normal.        Thought Content: Thought content normal.        Judgment: Judgment normal.     BP 126/76   Pulse 74   Temp 97.8 F (36.6 C) (Oral)   Resp 20   Ht 5\' 8"  (1.727 m)   Wt 215 lb (97.5 kg)   SpO2 95%   BMI 32.69 kg/m        Assessment & Plan:  David Garza comes in today with chief complaint of Medical Management of Chronic Issues   Diagnosis and orders addressed:  1. Essential hypertension Low sodium diet - lisinopril-hydrochlorothiazide (ZESTORETIC) 20-25 MG tablet; Take 1 tablet by mouth daily.  Dispense: 90 tablet; Refill: 1  2. Pure hypertriglyceridemia Low fat diet - pravastatin (PRAVACHOL) 80 MG tablet; Take 1 tablet (80 mg total) by mouth daily.  Dispense: 90 tablet; Refill: 1  3. Gastroesophageal reflux disease without esophagitis Avoid spicy foods Do not eat 2 hours prior to bedtime - omeprazole (PRILOSEC) 40 MG capsule; Take 1 capsule (40 mg total) by mouth daily.  Dispense: 90 capsule; Refill: 1  4. Idiopathic chronic gout of right  foot without tophus Continue low purine diet  5. Late effect of cerebrovascular accident (CVA) - clopidogrel (PLAVIX) 75 MG tablet; Take 1 tablet (75 mg total) by mouth daily.  Dispense: 90 tablet; Refill: 1  6. Neuropathy due to medical condition (HCC) - DULoxetine (CYMBALTA) 60 MG capsule; Take 1 capsule (60 mg total) by mouth daily.  Dispense: 90 capsule; Refill: 1 - gabapentin (NEURONTIN) 100 MG capsule; Take 2 capsules (200 mg total) by mouth 2 (two) times daily.  Dispense: 180 capsule; Refill: 1  7. BMI  33.0-33.9,adult Discussed diet and exercise for person with BMI >25 Will recheck weight in 3-6 months     Labs reviewed at appointment Health Maintenance reviewed Diet and exercise encouraged  Follow up plan: 3 months   [place], FNP

## 2019-01-31 NOTE — Patient Instructions (Signed)

## 2019-04-04 DIAGNOSIS — R69 Illness, unspecified: Secondary | ICD-10-CM | POA: Diagnosis not present

## 2019-05-06 ENCOUNTER — Other Ambulatory Visit: Payer: Self-pay

## 2019-05-06 ENCOUNTER — Other Ambulatory Visit: Payer: Medicare HMO

## 2019-05-06 DIAGNOSIS — Z125 Encounter for screening for malignant neoplasm of prostate: Secondary | ICD-10-CM

## 2019-05-06 DIAGNOSIS — I10 Essential (primary) hypertension: Secondary | ICD-10-CM | POA: Diagnosis not present

## 2019-05-06 DIAGNOSIS — E781 Pure hyperglyceridemia: Secondary | ICD-10-CM | POA: Diagnosis not present

## 2019-05-07 LAB — PSA, TOTAL AND FREE
PSA, Free Pct: 56.7 %
PSA, Free: 0.34 ng/mL
Prostate Specific Ag, Serum: 0.6 ng/mL (ref 0.0–4.0)

## 2019-05-07 LAB — CMP14+EGFR
ALT: 18 IU/L (ref 0–44)
AST: 18 IU/L (ref 0–40)
Albumin/Globulin Ratio: 2.1 (ref 1.2–2.2)
Albumin: 4.7 g/dL (ref 3.8–4.8)
Alkaline Phosphatase: 47 IU/L (ref 39–117)
BUN/Creatinine Ratio: 7 — ABNORMAL LOW (ref 10–24)
BUN: 9 mg/dL (ref 8–27)
Bilirubin Total: 0.8 mg/dL (ref 0.0–1.2)
CO2: 25 mmol/L (ref 20–29)
Calcium: 9.8 mg/dL (ref 8.6–10.2)
Chloride: 100 mmol/L (ref 96–106)
Creatinine, Ser: 1.27 mg/dL (ref 0.76–1.27)
GFR calc Af Amer: 67 mL/min/{1.73_m2} (ref >59)
GFR calc non Af Amer: 58 mL/min/{1.73_m2} — ABNORMAL LOW (ref >59)
Globulin, Total: 2.2 g/dL (ref 1.5–4.5)
Glucose: 115 mg/dL — ABNORMAL HIGH (ref 65–99)
Potassium: 3.8 mmol/L (ref 3.5–5.2)
Sodium: 139 mmol/L (ref 134–144)
Total Protein: 6.9 g/dL (ref 6.0–8.5)

## 2019-05-07 LAB — LIPID PANEL
Chol/HDL Ratio: 4.1 ratio (ref 0.0–5.0)
Cholesterol, Total: 139 mg/dL (ref 100–199)
HDL: 34 mg/dL — ABNORMAL LOW (ref >39)
LDL Chol Calc (NIH): 82 mg/dL (ref 0–99)
Triglycerides: 127 mg/dL (ref 0–149)
VLDL Cholesterol Cal: 23 mg/dL (ref 5–40)

## 2019-05-09 ENCOUNTER — Other Ambulatory Visit: Payer: Self-pay

## 2019-05-09 ENCOUNTER — Telehealth: Payer: Self-pay | Admitting: Nurse Practitioner

## 2019-05-10 ENCOUNTER — Encounter: Payer: Self-pay | Admitting: Nurse Practitioner

## 2019-05-10 ENCOUNTER — Ambulatory Visit (INDEPENDENT_AMBULATORY_CARE_PROVIDER_SITE_OTHER): Payer: Medicare HMO | Admitting: Nurse Practitioner

## 2019-05-10 VITALS — BP 127/81 | HR 72 | Temp 98.0°F | Resp 20 | Ht 68.0 in | Wt 215.0 lb

## 2019-05-10 DIAGNOSIS — G63 Polyneuropathy in diseases classified elsewhere: Secondary | ICD-10-CM | POA: Diagnosis not present

## 2019-05-10 DIAGNOSIS — M1A0710 Idiopathic chronic gout, right ankle and foot, without tophus (tophi): Secondary | ICD-10-CM | POA: Diagnosis not present

## 2019-05-10 DIAGNOSIS — Z6833 Body mass index (BMI) 33.0-33.9, adult: Secondary | ICD-10-CM | POA: Diagnosis not present

## 2019-05-10 DIAGNOSIS — E781 Pure hyperglyceridemia: Secondary | ICD-10-CM

## 2019-05-10 DIAGNOSIS — I10 Essential (primary) hypertension: Secondary | ICD-10-CM

## 2019-05-10 DIAGNOSIS — I6930 Unspecified sequelae of cerebral infarction: Secondary | ICD-10-CM

## 2019-05-10 DIAGNOSIS — K219 Gastro-esophageal reflux disease without esophagitis: Secondary | ICD-10-CM

## 2019-05-10 MED ORDER — CLOPIDOGREL BISULFATE 75 MG PO TABS: 75.0000 mg | ORAL_TABLET | Freq: Every day | ORAL | 1 refills | Status: DC

## 2019-05-10 MED ORDER — GABAPENTIN 100 MG PO CAPS: 200.0000 mg | ORAL_CAPSULE | Freq: Two times a day (BID) | ORAL | 1 refills | Status: DC

## 2019-05-10 MED ORDER — PRAVASTATIN SODIUM 80 MG PO TABS: 80.0000 mg | ORAL_TABLET | Freq: Every day | ORAL | 1 refills | Status: DC

## 2019-05-10 MED ORDER — DULOXETINE HCL 60 MG PO CPEP: 60.0000 mg | ORAL_CAPSULE | Freq: Every day | ORAL | 1 refills | Status: DC

## 2019-05-10 MED ORDER — LISINOPRIL-HYDROCHLOROTHIAZIDE 20-25 MG PO TABS: 1.0000 | ORAL_TABLET | Freq: Every day | ORAL | 1 refills | Status: DC

## 2019-05-10 MED ORDER — OMEPRAZOLE 40 MG PO CPDR: 40.0000 mg | DELAYED_RELEASE_CAPSULE | Freq: Every day | ORAL | 1 refills | Status: DC

## 2019-05-10 NOTE — Progress Notes (Signed)
Subjective:    Patient ID: David Garza, male    DOB: Oct 26, 1950, 69 y.o.   MRN: [id number]   Chief Complaint: Medical Management of Chronic Issues    HPI:  1. Essential hypertension No c/o chest pain, sob or headache. Blood pressure at home running 120's/80's. BP Readings from Last 3 Encounters:  05/10/19 127/81  01/31/19 126/76  10/29/18 (!) 146/86    2. Pure hypertriglyceridemia Has been watching diet and has been exercising at the gym 3 days a week for 2 months now. Lab Results  Component Value Date   CHOL 139 05/06/2019   HDL 34 (L) 05/06/2019   LDLCALC 82 05/06/2019   TRIG 127 05/06/2019   CHOLHDL 4.1 05/06/2019     3. Gastroesophageal reflux disease without esophagitis is on omeprazole daily. Works well t keep symptoms under control  4. Late effect of cerebrovascular accident (CVA) Still some weakness on right sid but going to gym has really been helping him.  5. Neuropathy due to medical condition (David Garza) Still has numbness in right arm and hand from stroke. He is on neurontin which helps. He says that he believes that it is getting better.  6. 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 BMI 33.0-33.9,adult No recent weight changes Wt Readings from Last 3 Encounters:  05/10/19 215 lb (97.5 kg)  01/31/19 215 lb (97.5 kg)  10/29/18 216 lb (98 kg)   BP Readings from Last 3 Encounters:  05/10/19 127/81  01/31/19 126/76  10/29/18 (!) 146/86       Outpatient Encounter Medications as of 05/10/2019  Medication Sig  . clopidogrel (PLAVIX) 75 MG tablet Take 1 tablet (75 mg total) by mouth daily.  . DULoxetine (CYMBALTA) 60 MG capsule Take 1 capsule (60 mg total) by mouth daily.  . fish oil-omega-3 fatty acids 1000 MG capsule Take 2 g by mouth daily.  . fluticasone (FLONASE) 50 MCG/ACT nasal spray Place 2 sprays into both nostrils daily.  Marland Kitchen gabapentin (NEURONTIN) 100 MG capsule Take 2 capsules (200 mg  total) by mouth 2 (two) times daily.  Marland Kitchen lisinopril-hydrochlorothiazide (ZESTORETIC) 20-25 MG tablet Take 1 tablet by mouth daily.  . meclizine (ANTIVERT) 12.5 MG tablet Take 1 tablet (12.5 mg total) by mouth 3 (three) times daily as needed for dizziness.  Marland Kitchen omeprazole (PRILOSEC) 40 MG capsule Take 1 capsule (40 mg total) by mouth daily.  . pravastatin (PRAVACHOL) 80 MG tablet Take 1 tablet (80 mg total) by mouth daily.     Past Surgical History:  Procedure Laterality Date  . KNEE ARTHROSCOPY Right   . SHOULDER SURGERY Right     Family History  Problem Relation Age of Onset  . Cancer Mother        pancreatic  . Alzheimer's disease Mother 15  . Diabetes Sister   . Aneurysm Sister        questionable - possible heart attack   . Coronary artery disease Maternal Grandfather   . Alcohol abuse Maternal Grandfather   . GI Disease Maternal Grandfather   . Early death Father   . Alzheimer's disease Paternal Uncle 68  . Aneurysm Paternal Uncle   . 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  Eyes: Negative for pain.  Respiratory: Negative for shortness of breath.   Cardiovascular: Negative for chest pain, palpitations and leg swelling.  Gastrointestinal: Negative for abdominal pain.  Endocrine: Negative for polydipsia.  Skin: Negative for rash.  Neurological: Negative for dizziness, weakness and headaches.  Hematological: Does not bruise/bleed easily.  All other systems reviewed and are negative.      Objective:   Physical Exam Vitals and nursing note reviewed.  Constitutional:      Appearance: Normal appearance. He is well-developed.  HENT:     Head: Normocephalic.     Nose: Nose normal.  Eyes:     Pupils: Pupils are equal, round, and reactive to light.  Neck:     Thyroid: No thyroid mass or thyromegaly.      Vascular: No carotid bruit or JVD.     Trachea: Phonation normal.  Cardiovascular:     Rate and Rhythm: Normal rate and regular rhythm.  Pulmonary:     Effort: Pulmonary effort is normal. No respiratory distress.     Breath sounds: Normal breath sounds.  Abdominal:     General: Bowel sounds are normal.     Palpations: Abdomen is soft.     Tenderness: There is no abdominal tenderness.  Musculoskeletal:        General: Normal range of motion.     Cervical back: Normal range of motion and neck supple.     Comments: Right grip slightly weaker then left  Lymphadenopathy:     Cervical: No cervical adenopathy.  Skin:    General: Skin is warm and dry.  Neurological:     Mental Status: He is alert and oriented to person, place, and time.  Psychiatric:        Behavior: Behavior normal.        Thought Content: Thought content normal.        Judgment: Judgment normal.     BP 127/81   Pulse 72   Temp 98 F (36.7 C) (Temporal)   Resp 20   Ht 5\' 8"  (1.727 m)   Wt 215 lb (97.5 kg)   SpO2 97%   BMI 32.69 kg/m         Assessment & Plan:  David Garza comes in today with chief complaint of Medical Management of Chronic Issues   Diagnosis and orders addressed:  1. Essential hypertension Low sodium diet - lisinopril-hydrochlorothiazide (ZESTORETIC) 20-25 MG tablet; Take 1 tablet by mouth daily.  Dispense: 90 tablet; Refill: 1  2. Pure hypertriglyceridemia Low fat diet - pravastatin (PRAVACHOL) 80 MG tablet; Take 1 tablet (80 mg total) by mouth daily.  Dispense: 90 tablet; Refill: 1  3. Gastroesophageal reflux disease without esophagitis Avoid spicy foods Do not eat 2 hours prior to bedtime - omeprazole (PRILOSEC) 40 MG capsule; Take 1 capsule (40 mg total) by mouth daily.  Dispense: 90 capsule; Refill: 1  4. Late effect of cerebrovascular accident (CVA) Continue exercise - clopidogrel (PLAVIX) 75 MG tablet; Take 1 tablet (75 mg total) by mouth daily.  Dispense: 90  tablet; Refill: 1  5. Neuropathy due to medical condition (HCC) - DULoxetine (CYMBALTA) 60 MG capsule; Take 1 capsule (60 mg total) by mouth daily.  Dispense: 90 capsule; Refill: 1 - gabapentin (NEURONTIN) 100 MG capsule; Take 2 capsules (200 mg total) by mouth 2 (two) times daily.  Dispense: 180 capsule; Refill: 1  6. Idiopathic chronic gout of right foot without tophus continue low purine diet  7. BMI 33.0-33.9,adult Discussed diet and exercise for person with  BMI >25 Will recheck weight in 3-6 months    Labs pending Health Maintenance reviewed Diet and exercise encouraged  Follow up plan: 3 months   Mary-Margaret Hassell Done, FNP

## 2019-05-10 NOTE — Patient Instructions (Signed)
Exercising to Stay Healthy To become healthy and stay healthy, it is recommended that you do moderate-intensity and vigorous-intensity exercise. You can tell that you are exercising at a moderate intensity if your heart starts beating faster and you start breathing faster but can still hold a conversation. You can tell that you are exercising at a vigorous intensity if you are breathing much harder and faster and cannot hold a conversation while exercising. Exercising regularly is important. It has many health benefits, such as:  Improving overall fitness, flexibility, and endurance.  Increasing bone density.  Helping with weight control.  Decreasing body fat.  Increasing muscle strength.  Reducing stress and tension.  Improving overall health. How often should I exercise? Choose an activity that you enjoy, and set realistic goals. Your health care provider can help you make an activity plan that works for you. Exercise regularly as told by your health care provider. This may include:  Doing strength training two times a week, such as: ? Lifting weights. ? Using resistance bands. ? Push-ups. ? Sit-ups. ? Yoga.  Doing a certain intensity of exercise for a given amount of time. Choose from these options: ? A total of 150 minutes of moderate-intensity exercise every week. ? A total of 75 minutes of vigorous-intensity exercise every week. ? A mix of moderate-intensity and vigorous-intensity exercise every week. Children, pregnant women, people who have not exercised regularly, people who are overweight, and older adults may need to talk with a health care provider about what activities are safe to do. If you have a medical condition, be sure to talk with your health care provider before you start a new exercise program. What are some exercise ideas? Moderate-intensity exercise ideas include:  Walking 1 mile (1.6 km) in about 15  minutes.  Biking.  Hiking.  Golfing.  Dancing.  Water aerobics. Vigorous-intensity exercise ideas include:  Walking 4.5 miles (7.2 km) or more in about 1 hour.  Jogging or running 5 miles (8 km) in about 1 hour.  Biking 10 miles (16.1 km) or more in about 1 hour.  Lap swimming.  Roller-skating or in-line skating.  Cross-country skiing.  Vigorous competitive sports, such as football, basketball, and soccer.  Jumping rope.  Aerobic dancing. What are some everyday activities that can help me to get exercise?  Yard work, such as: ? Pushing a lawn mower. ? Raking and bagging leaves.  Washing your car.  Pushing a stroller.  Shoveling snow.  Gardening.  Washing windows or floors. How can I be more active in my day-to-day activities?  Use stairs instead of an elevator.  Take a walk during your lunch break.  If you drive, park your car farther away from your work or school.  If you take public transportation, get off one stop early and walk the rest of the way.  Stand up or walk around during all of your indoor phone calls.  Get up, stretch, and walk around every 30 minutes throughout the day.  Enjoy exercise with a friend. Support to continue exercising will help you keep a regular routine of activity. What guidelines can I follow while exercising?  Before you start a new exercise program, talk with your health care provider.  Do not exercise so much that you hurt yourself, feel dizzy, or get very short of breath.  Wear comfortable clothes and wear shoes with good support.  Drink plenty of water while you exercise to prevent dehydration or heat stroke.  Work out until your breathing   and your heartbeat get faster. Where to find more information  [place] Department of Health and Human Services: www.hhs.gov  Centers for Disease Control and Prevention (CDC): www.cdc.gov Summary  Exercising regularly is important. It will improve your overall fitness,  flexibility, and endurance.  Regular exercise also will improve your overall health. It can help you control your weight, reduce stress, and improve your bone density.  Do not exercise so much that you hurt yourself, feel dizzy, or get very short of breath.  Before you start a new exercise program, talk with your health care provider. This information is not intended to replace advice given to you by your health care provider. Make sure you discuss any questions you have with your health care provider. Document Revised: 03/31/2017 Document Reviewed: 03/09/2017 Elsevier Patient Education  2020 Elsevier Inc.  

## 2019-05-31 ENCOUNTER — Other Ambulatory Visit: Payer: Self-pay | Admitting: *Deleted

## 2019-05-31 DIAGNOSIS — G63 Polyneuropathy in diseases classified elsewhere: Secondary | ICD-10-CM

## 2019-05-31 MED ORDER — GABAPENTIN 100 MG PO CAPS: 200.0000 mg | ORAL_CAPSULE | Freq: Two times a day (BID) | ORAL | 1 refills | Status: DC

## 2019-05-31 NOTE — Progress Notes (Signed)
PA was initiated on cover my meds for qty #180 -- pt only needs #120 for a 30 day supply   PA deleted and new rx for #120 sent to pharm. - we will see if this is approved   Pt called and aware of what we are doing.

## 2019-06-03 ENCOUNTER — Telehealth: Payer: Self-pay

## 2019-06-03 NOTE — Telephone Encounter (Signed)
Prior Authorization done and approved for Gabapentin 100mg  #120 q month. Key # [id number]  PA Case ID- [id number]. And Rx # [id number]. Contacted [facility] in [place] and notified them of approval. Notified wife that approval done and med ready for pick up. Wife verbalized udnerstanding

## 2019-06-12 ENCOUNTER — Other Ambulatory Visit: Payer: Self-pay | Admitting: Family Medicine

## 2019-06-12 ENCOUNTER — Telehealth: Payer: Self-pay | Admitting: Nurse Practitioner

## 2019-06-12 DIAGNOSIS — R42 Dizziness and giddiness: Secondary | ICD-10-CM

## 2019-06-12 MED ORDER — MECLIZINE HCL 12.5 MG PO TABS: 12.5000 mg | ORAL_TABLET | Freq: Three times a day (TID) | ORAL | 0 refills | Status: DC | PRN

## 2019-06-12 NOTE — Telephone Encounter (Signed)
Wife aware and verbalizes understanding per dpr.  °

## 2019-06-12 NOTE — Telephone Encounter (Signed)
Sent in RX. He will be fine to take the vaccine.

## 2019-06-12 NOTE — Telephone Encounter (Signed)
lmtcb

## 2019-07-29 ENCOUNTER — Other Ambulatory Visit: Payer: Self-pay | Admitting: *Deleted

## 2019-07-29 ENCOUNTER — Telehealth: Payer: Self-pay | Admitting: Nurse Practitioner

## 2019-07-29 NOTE — Telephone Encounter (Signed)
  Prescription Request  07/29/2019  What is the name of the medication or equipment? rx for gout  Have you contacted your pharmacy to request a refill? (if applicable) no  Which pharmacy would you like this sent to? [facility] in [facility]   Patient notified that their request is being sent to the clinical staff for review and that they should receive a response within 2 business days.

## 2019-07-31 MED ORDER — COLCHICINE 0.6 MG PO TABS: ORAL_TABLET | ORAL | 0 refills | Status: DC

## 2019-07-31 NOTE — Telephone Encounter (Signed)
Patient wife aware and verbalized understanding. 

## 2019-07-31 NOTE — Telephone Encounter (Signed)
Colchicine sent. Take as prescribed.

## 2019-08-05 ENCOUNTER — Other Ambulatory Visit: Payer: Medicare HMO

## 2019-08-05 ENCOUNTER — Other Ambulatory Visit: Payer: Self-pay

## 2019-08-05 DIAGNOSIS — I10 Essential (primary) hypertension: Secondary | ICD-10-CM | POA: Diagnosis not present

## 2019-08-05 DIAGNOSIS — E781 Pure hyperglyceridemia: Secondary | ICD-10-CM | POA: Diagnosis not present

## 2019-08-06 LAB — CBC WITH DIFFERENTIAL/PLATELET
Basophils Absolute: 0.1 10*3/uL (ref 0.0–0.2)
Basos: 1 %
EOS (ABSOLUTE): 0.3 10*3/uL (ref 0.0–0.4)
Eos: 4 %
Hematocrit: 48.6 % (ref 37.5–51.0)
Hemoglobin: 16.9 g/dL (ref 13.0–17.7)
Immature Grans (Abs): 0 10*3/uL (ref 0.0–0.1)
Immature Granulocytes: 0 %
Lymphocytes Absolute: 1.6 10*3/uL (ref 0.7–3.1)
Lymphs: 20 %
MCH: 30.9 pg (ref 26.6–33.0)
MCHC: 34.8 g/dL (ref 31.5–35.7)
MCV: 89 fL (ref 79–97)
Monocytes Absolute: 0.6 10*3/uL (ref 0.1–0.9)
Monocytes: 7 %
Neutrophils Absolute: 5.3 10*3/uL (ref 1.4–7.0)
Neutrophils: 68 %
Platelets: 235 10*3/uL (ref 150–450)
RBC: 5.47 x10E6/uL (ref 4.14–5.80)
RDW: 12.8 % (ref 11.6–15.4)
WBC: 7.8 10*3/uL (ref 3.4–10.8)

## 2019-08-06 LAB — LIPID PANEL
Chol/HDL Ratio: 4.4 ratio (ref 0.0–5.0)
Cholesterol, Total: 145 mg/dL (ref 100–199)
HDL: 33 mg/dL — ABNORMAL LOW (ref >39)
LDL Chol Calc (NIH): 88 mg/dL (ref 0–99)
Triglycerides: 131 mg/dL (ref 0–149)
VLDL Cholesterol Cal: 24 mg/dL (ref 5–40)

## 2019-08-06 LAB — CMP14+EGFR
ALT: 16 IU/L (ref 0–44)
AST: 13 IU/L (ref 0–40)
Albumin/Globulin Ratio: 2 (ref 1.2–2.2)
Albumin: 4.7 g/dL (ref 3.8–4.8)
Alkaline Phosphatase: 48 IU/L (ref 39–117)
BUN/Creatinine Ratio: 10 (ref 10–24)
BUN: 10 mg/dL (ref 8–27)
Bilirubin Total: 0.5 mg/dL (ref 0.0–1.2)
CO2: 24 mmol/L (ref 20–29)
Calcium: 10.2 mg/dL (ref 8.6–10.2)
Chloride: 101 mmol/L (ref 96–106)
Creatinine, Ser: 1.04 mg/dL (ref 0.76–1.27)
GFR calc Af Amer: 85 mL/min/{1.73_m2} (ref >59)
GFR calc non Af Amer: 74 mL/min/{1.73_m2} (ref >59)
Globulin, Total: 2.3 g/dL (ref 1.5–4.5)
Glucose: 112 mg/dL — ABNORMAL HIGH (ref 65–99)
Potassium: 4.1 mmol/L (ref 3.5–5.2)
Sodium: 141 mmol/L (ref 134–144)
Total Protein: 7 g/dL (ref 6.0–8.5)

## 2019-08-09 ENCOUNTER — Encounter: Payer: Self-pay | Admitting: Nurse Practitioner

## 2019-08-09 ENCOUNTER — Ambulatory Visit (INDEPENDENT_AMBULATORY_CARE_PROVIDER_SITE_OTHER): Payer: Medicare HMO | Admitting: Nurse Practitioner

## 2019-08-09 ENCOUNTER — Other Ambulatory Visit: Payer: Self-pay

## 2019-08-09 VITALS — BP 138/87 | HR 70 | Temp 97.5°F | Resp 20 | Ht 68.0 in | Wt 217.0 lb

## 2019-08-09 DIAGNOSIS — G63 Polyneuropathy in diseases classified elsewhere: Secondary | ICD-10-CM

## 2019-08-09 DIAGNOSIS — K219 Gastro-esophageal reflux disease without esophagitis: Secondary | ICD-10-CM

## 2019-08-09 DIAGNOSIS — I6930 Unspecified sequelae of cerebral infarction: Secondary | ICD-10-CM

## 2019-08-09 DIAGNOSIS — E781 Pure hyperglyceridemia: Secondary | ICD-10-CM | POA: Diagnosis not present

## 2019-08-09 DIAGNOSIS — I10 Essential (primary) hypertension: Secondary | ICD-10-CM

## 2019-08-09 DIAGNOSIS — Z6833 Body mass index (BMI) 33.0-33.9, adult: Secondary | ICD-10-CM

## 2019-08-09 DIAGNOSIS — R739 Hyperglycemia, unspecified: Secondary | ICD-10-CM | POA: Diagnosis not present

## 2019-08-09 DIAGNOSIS — M1A0710 Idiopathic chronic gout, right ankle and foot, without tophus (tophi): Secondary | ICD-10-CM

## 2019-08-09 LAB — BAYER DCA HB A1C WAIVED: HB A1C (BAYER DCA - WAIVED): 5.7 % (ref <7.0)

## 2019-08-09 MED ORDER — COLCHICINE 0.6 MG PO TABS: ORAL_TABLET | ORAL | 0 refills | Status: DC

## 2019-08-09 MED ORDER — GABAPENTIN 100 MG PO CAPS: 200.0000 mg | ORAL_CAPSULE | Freq: Two times a day (BID) | ORAL | 1 refills | Status: DC

## 2019-08-09 MED ORDER — CLOPIDOGREL BISULFATE 75 MG PO TABS: 75.0000 mg | ORAL_TABLET | Freq: Every day | ORAL | 1 refills | Status: DC

## 2019-08-09 MED ORDER — OMEPRAZOLE 40 MG PO CPDR: 40.0000 mg | DELAYED_RELEASE_CAPSULE | Freq: Every day | ORAL | 1 refills | Status: DC

## 2019-08-09 MED ORDER — LISINOPRIL-HYDROCHLOROTHIAZIDE 20-25 MG PO TABS: 1.0000 | ORAL_TABLET | Freq: Every day | ORAL | 1 refills | Status: DC

## 2019-08-09 MED ORDER — PRAVASTATIN SODIUM 80 MG PO TABS: 80.0000 mg | ORAL_TABLET | Freq: Every day | ORAL | 1 refills | Status: DC

## 2019-08-09 MED ORDER — DULOXETINE HCL 60 MG PO CPEP: 60.0000 mg | ORAL_CAPSULE | Freq: Every day | ORAL | 1 refills | Status: DC

## 2019-08-09 NOTE — Patient Instructions (Signed)
Carbohydrate Counting for Diabetes Mellitus, Adult  Carbohydrate counting is a method of keeping track of how many carbohydrates you eat. Eating carbohydrates naturally increases the amount of sugar (glucose) in the blood. Counting how many carbohydrates you eat helps keep your blood glucose within normal limits, which helps you manage your diabetes (diabetes mellitus). It is important to know how many carbohydrates you can safely have in each meal. This is different for every person. A diet and nutrition specialist (registered dietitian) can help you make a meal plan and calculate how many carbohydrates you should have at each meal and snack. Carbohydrates are found in the following foods:  Grains, such as breads and cereals.  Dried beans and soy products.  Starchy vegetables, such as potatoes, peas, and corn.  Fruit and fruit juices.  Milk and yogurt.  Sweets and snack foods, such as cake, cookies, candy, chips, and soft drinks. How do I count carbohydrates? There are two ways to count carbohydrates in food. You can use either of the methods or a combination of both. Reading "Nutrition Facts" on packaged food The "Nutrition Facts" list is included on the labels of almost all packaged foods and beverages in the [place] It includes:  The serving size.  Information about nutrients in each serving, including the grams (g) of carbohydrate per serving. To use the "Nutrition Facts":  Decide how many servings you will have.  Multiply the number of servings by the number of carbohydrates per serving.  The resulting number is the total amount of carbohydrates that you will be having. Learning standard serving sizes of other foods When you eat carbohydrate foods that are not packaged or do not include "Nutrition Facts" on the label, you need to measure the servings in order to count the amount of carbohydrates:  Measure the foods that you will eat with a food scale or measuring cup, if  needed.  Decide how many standard-size servings you will eat.  Multiply the number of servings by 15. Most carbohydrate-rich foods have about 15 g of carbohydrates per serving. ? For example, if you eat 8 oz (170 g) of strawberries, you will have eaten 2 servings and 30 g of carbohydrates (2 servings x 15 g = 30 g).  For foods that have more than one food mixed, such as soups and casseroles, you must count the carbohydrates in each food that is included. The following list contains standard serving sizes of common carbohydrate-rich foods. Each of these servings has about 15 g of carbohydrates:   hamburger bun or  English muffin.   oz (15 mL) syrup.   oz (14 g) jelly.  1 slice of bread.  1 six-inch tortilla.  3 oz (85 g) cooked rice or pasta.  4 oz (113 g) cooked dried beans.  4 oz (113 g) starchy vegetable, such as peas, corn, or potatoes.  4 oz (113 g) hot cereal.  4 oz (113 g) mashed potatoes or  of a large baked potato.  4 oz (113 g) canned or frozen fruit.  4 oz (120 mL) fruit juice.  4-6 crackers.  6 chicken nuggets.  6 oz (170 g) unsweetened dry cereal.  6 oz (170 g) plain fat-free yogurt or yogurt sweetened with artificial sweeteners.  8 oz (240 mL) milk.  8 oz (170 g) fresh fruit or one small piece of fruit.  24 oz (680 g) popped popcorn. Example of carbohydrate counting Sample meal  3 oz (85 g) chicken breast.  6 oz (170 g)   brown rice.  4 oz (113 g) corn.  8 oz (240 mL) milk.  8 oz (170 g) strawberries with sugar-free whipped topping. Carbohydrate calculation 1. Identify the foods that contain carbohydrates: ? Rice. ? Corn. ? Milk. ? Strawberries. 2. Calculate how many servings you have of each food: ? 2 servings rice. ? 1 serving corn. ? 1 serving milk. ? 1 serving strawberries. 3. Multiply each number of servings by 15 g: ? 2 servings rice x 15 g = 30 g. ? 1 serving corn x 15 g = 15 g. ? 1 serving milk x 15 g = 15 g. ? 1  serving strawberries x 15 g = 15 g. 4. Add together all of the amounts to find the total grams of carbohydrates eaten: ? 30 g + 15 g + 15 g + 15 g = 75 g of carbohydrates total. Summary  Carbohydrate counting is a method of keeping track of how many carbohydrates you eat.  Eating carbohydrates naturally increases the amount of sugar (glucose) in the blood.  Counting how many carbohydrates you eat helps keep your blood glucose within normal limits, which helps you manage your diabetes.  A diet and nutrition specialist (registered dietitian) can help you make a meal plan and calculate how many carbohydrates you should have at each meal and snack. This information is not intended to replace advice given to you by your health care provider. Make sure you discuss any questions you have with your health care provider. Document Revised: 11/10/2016 Document Reviewed: 09/30/2015 Elsevier Patient Education  2020 Elsevier Inc.  

## 2019-08-09 NOTE — Progress Notes (Signed)
Subjective:    Patient ID: David Garza, male    DOB: [birth year], 69 y.o.   MRN: [id number]   Chief Complaint: Medical Management of Chronic Issues    HPI:  1. Essential hypertension No c/o chest pain, sob or headache. Does not check blood pressure at home. BP Readings from Last 3 Encounters:  08/09/19 138/87  05/10/19 127/81  01/31/19 126/76     2. Pure hypertriglyceridemia Wife makes sure he watches his diet. 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 Lab Results  Component Value Date   CHOL 145 08/05/2019   HDL 33 (L) 08/05/2019   LDLCALC 88 08/05/2019   TRIG 131 08/05/2019   CHOLHDL 4.4 08/05/2019     3. Late effect of cerebrovascular accident (CVA) His only residual effect is numbness and tingling of right hand and arm.  4. Neuropathy due to medical condition ([place]) Takes gabapentin and cymbalta daily and works well.  5. Gastroesophageal reflux disease without esophagitis Is on omeprazole daily and is working well for his symptoms.  6. Idiopathic chronic gout of right foot without tophus Had flare up last week and it took 5 days to get colchicine.  7. BMI 33.0-33.9,adult No recent weight changes Wt Readings from Last 3 Encounters:  08/09/19 217 lb (98.4 kg)  05/10/19 215 lb (97.5 kg)  01/31/19 215 lb (97.5 kg)   BMI Readings from Last 3 Encounters:  08/09/19 32.99 kg/m  05/10/19 32.69 kg/m  01/31/19 32.69 kg/m       Outpatient Encounter Medications as of 08/09/2019  Medication Sig  . clopidogrel (PLAVIX) 75 MG tablet Take 1 tablet (75 mg total) by mouth daily.  . colchicine 0.6 MG tablet Take 1.2 mg (2 tablets) by mouth x1, then 0.6 mg (1 tablet) 1 hour later.  . DULoxetine (CYMBALTA) 60 MG capsule Take 1 capsule (60 mg total) by mouth daily.  . fish oil-omega-3 fatty acids 1000 MG capsule Take 2 g by mouth daily.  . fluticasone (FLONASE) 50 MCG/ACT nasal spray Place 2 sprays into both nostrils daily.  Marland Kitchen gabapentin  (NEURONTIN) 100 MG capsule Take 2 capsules (200 mg total) by mouth 2 (two) times daily.  Marland Kitchen lisinopril-hydrochlorothiazide (ZESTORETIC) 20-25 MG tablet Take 1 tablet by mouth daily.  . meclizine (ANTIVERT) 12.5 MG tablet Take 1 tablet (12.5 mg total) by mouth 3 (three) times daily as needed for dizziness.  Marland Kitchen omeprazole (PRILOSEC) 40 MG capsule Take 1 capsule (40 mg total) by mouth daily.  . pravastatin (PRAVACHOL) 80 MG tablet Take 1 tablet (80 mg total) by mouth daily.    Past Surgical History:  Procedure Laterality Date  . KNEE ARTHROSCOPY Right   . SHOULDER SURGERY Right     Family History  Problem Relation Age of Onset  . Cancer Mother        pancreatic  . Alzheimer's disease Mother 48  . Diabetes Sister   . Aneurysm Sister        questionable - possible heart attack   . Coronary artery disease Maternal Grandfather   . Alcohol abuse Maternal Grandfather   . GI Disease Maternal Grandfather   . Early death Father   . Alzheimer's disease Paternal Uncle 91  . Aneurysm Paternal Uncle   . 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  Eyes: Negative for pain.  Respiratory: Negative for shortness of breath.   Cardiovascular: Negative for chest pain, palpitations and leg swelling.  Gastrointestinal: Negative for abdominal pain.  Endocrine: Negative for polydipsia.  Skin: Negative for rash.  Neurological: Negative for dizziness, weakness and headaches.  Hematological: Does not bruise/bleed easily.  All other systems reviewed and are negative.      Objective:   Physical Exam Vitals and nursing note reviewed.  Constitutional:      Appearance: Normal appearance. He is well-developed.  HENT:     Head: Normocephalic.     Nose: Nose normal.  Eyes:     Pupils: Pupils are equal, round, and reactive to  light.  Neck:     Thyroid: No thyroid mass or thyromegaly.     Vascular: No carotid bruit or JVD.     Trachea: Phonation normal.  Cardiovascular:     Rate and Rhythm: Normal rate and regular rhythm.  Pulmonary:     Effort: Pulmonary effort is normal. No respiratory distress.     Breath sounds: Normal breath sounds.  Abdominal:     General: Bowel sounds are normal.     Palpations: Abdomen is soft.     Tenderness: There is no abdominal tenderness.  Musculoskeletal:        General: Normal range of motion.     Cervical back: Normal range of motion and neck supple.  Lymphadenopathy:     Cervical: No cervical adenopathy.  Skin:    General: Skin is warm and dry.  Neurological:     Mental Status: He is alert and oriented to person, place, and time.  Psychiatric:        Behavior: Behavior normal.        Thought Content: Thought content normal.        Judgment: Judgment normal.    BP 138/87   Pulse 70   Temp (!) 97.5 F (36.4 C) (Temporal)   Resp 20   Ht 5\' 8"  (1.727 m)   Wt 217 lb (98.4 kg)   SpO2 94%   BMI 32.99 kg/m        Assessment & Plan:  David Garza comes in today with chief complaint of Medical Management of Chronic Issues   Diagnosis and orders addressed:  1. Essential hypertension Low sodium diet - lisinopril-hydrochlorothiazide (ZESTORETIC) 20-25 MG tablet; Take 1 tablet by mouth daily.  Dispense: 90 tablet; Refill: 1  2. Pure hypertriglyceridemia Low fat doiet - pravastatin (PRAVACHOL) 80 MG tablet; Take 1 tablet (80 mg total) by mouth daily.  Dispense: 90 tablet; Refill: 1  3. Late effect of cerebrovascular accident (CVA) Fall prevention - clopidogrel (PLAVIX) 75 MG tablet; Take 1 tablet (75 mg total) by mouth daily.  Dispense: 90 tablet; Refill: 1  4. Neuropathy due to medical condition ([facility]) - gabapentin (NEURONTIN) 100 MG capsule; Take 2 capsules (200 mg total) by mouth 2 (two) times daily.  Dispense: 120 capsule; Refill: 1 - DULoxetine  (CYMBALTA) 60 MG capsule; Take 1 capsule (60 mg total) by mouth daily.  Dispense: 90 capsule; Refill: 1  5. Gastroesophageal reflux disease without esophagitis Avoid spicy foods Do not eat 2 hours prior to bedtime - omeprazole (PRILOSEC) 40 MG capsule; Take 1 capsule (40 mg total) by mouth daily.  Dispense: 90 capsule; Refill: 1  6. Idiopathic chronic gout of right foot without tophus Low purine diet refilled colchicine  7. BMI 33.0-33.9,adult Discussed diet and exercise for person  with BMI >25 Will recheck weight in 3-6 months  8. Hyperglycemia hgba1c done today   Labs pending Health Maintenance reviewed Diet and exercise encouraged  Follow up plan: 3 months   [place], FNP

## 2019-09-16 DIAGNOSIS — R69 Illness, unspecified: Secondary | ICD-10-CM | POA: Diagnosis not present

## 2019-10-25 ENCOUNTER — Ambulatory Visit (INDEPENDENT_AMBULATORY_CARE_PROVIDER_SITE_OTHER): Payer: Medicare HMO | Admitting: *Deleted

## 2019-10-25 DIAGNOSIS — Z0000 Encounter for general adult medical examination without abnormal findings: Secondary | ICD-10-CM

## 2019-10-25 NOTE — Progress Notes (Signed)
MEDICARE ANNUAL WELLNESS VISIT  10/25/2019  Telephone Visit Disclaimer This Medicare AWV was conducted by telephone due to national recommendations for restrictions regarding the COVID-19 Pandemic (e.g. social distancing).  I verified, using two identifiers, that I am speaking with David Garza or their authorized healthcare agent. I discussed the limitations, risks, security, and privacy concerns of performing an evaluation and management service by telephone and the potential availability of an in-person appointment in the future. The patient expressed understanding and agreed to proceed.   Subjective:  David Garza is a 69 y.o. male patient of David Garza, David Garza who had a Medicare Annual Wellness Visit today via telephone. 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 He lives with his David David Garza and their daughter. He reports that he is socially active and does interact with friends/family regularly. He attends church. He is moderately physically active. He goes to the gym three days a week with his David. He enjoys working in his shop.  Patient Care Team: David Pretty, FNP as PCP - General (Nurse Practitioner) David Keels, MD (Surgery) David Garza, Amy, MD as Consulting Physician (Dermatology)  Advanced Directives 10/25/2019 09/19/2018 05/16/2014  Does Patient Have a Medical Advance Directive? No No No  Would patient like information on creating a medical advance directive? No - Patient declined No - Patient declined Yes - Educational materials given    Hospital Utilization Over the Past 12 Months: # of hospitalizations or ER visits: 0 # of surgeries: 0  Review of Systems    Patient reports that his overall health is better compared to last year.  History obtained from the patient and patient chart.   Patient Reported Readings (BP, Pulse, CBG, Weight, etc) none  Pain Assessment Pain : No/denies pain     Current Medications & Allergies  (verified) Allergies as of 10/25/2019      Reactions   Meloxicam Hypertension   Nsaids Hypertension      Medication List       Accurate as of October 25, 2019  9:04 AM. If you have any questions, ask your nurse or doctor.        STOP taking these medications   fluticasone 50 MCG/ACT nasal spray Commonly known as: FLONASE     TAKE these medications   clopidogrel 75 MG tablet Commonly known as: PLAVIX Take 1 tablet (75 mg total) by mouth daily.   colchicine 0.6 MG tablet Take 1.2 mg (2 tablets) by mouth x1, then 0.6 mg (1 tablet) 1 hour later.   DULoxetine 60 MG capsule Commonly known as: CYMBALTA Take 1 capsule (60 mg total) by mouth daily.   fish oil-omega-3 fatty acids 1000 MG capsule Take 2 g by mouth daily.   gabapentin 100 MG capsule Commonly known as: NEURONTIN Take 2 capsules (200 mg total) by mouth 2 (two) times daily.   lisinopril-hydrochlorothiazide 20-25 MG tablet Commonly known as: ZESTORETIC Take 1 tablet by mouth daily.   meclizine 12.5 MG tablet Commonly known as: ANTIVERT Take 1 tablet (12.5 mg total) by mouth 3 (three) times daily as needed for dizziness.   omeprazole 40 MG capsule Commonly known as: PRILOSEC Take 1 capsule (40 mg total) by mouth daily.   pravastatin 80 MG tablet Commonly known as: PRAVACHOL Take 1 tablet (80 mg total) by mouth daily.       History (reviewed): Past Medical History:  Diagnosis Date  . Hyperlipidemia   . Hypertension   . Metabolic syndrome   . Stroke [facility])  06/2016   Past Surgical History:  Procedure Laterality Date  . KNEE ARTHROSCOPY Right   . SHOULDER SURGERY Right    Family History  Problem Relation Age of Onset  . Cancer Mother        pancreatic  . Alzheimer's disease Mother 36  . Diabetes Sister   . Aneurysm Sister        questionable - possible heart attack   . Coronary artery disease Maternal Grandfather   . Alcohol abuse Maternal Grandfather   . GI Disease Maternal Grandfather   .  Early death Father   . Alzheimer's disease Paternal Uncle 71  . Aneurysm Paternal Uncle   . Cancer Sister        male   Social History   Socioeconomic History  . Marital status: Married    Spouse name: David Garza   . Number of children: 1  . Years of education: 65  . Highest education level: High school graduate  Occupational History  . Occupation: Retired    Comment: continues to farm   Tobacco Use  . Smoking status: Former Smoker    Types: Cigarettes    Quit date: 04/01/1998    Years since quitting: 21.5  . Smokeless tobacco: Never Used  Vaping Use  . Vaping Use: Never used  Substance and Sexual Activity  . Alcohol use: No  . Drug use: No  . Sexual activity: Not on file  Other Topics Concern  . Not on file  Social History Narrative   Lives at home with David Garza and daughter David Garza lives at home    Social Determinants of Health   Financial Resource Strain:   . Difficulty of Paying Living Expenses:   Food Insecurity:   . Worried About Charity fundraiser in the Last Year:   . Arboriculturist in the Last Year:   Transportation Needs:   . Film/video editor (Medical):   Marland Kitchen Lack of Transportation (Non-Medical):   Physical Activity:   . Days of Exercise per Week:   . Minutes of Exercise per Session:   Stress:   . Feeling of Stress :   Social Connections:   . Frequency of Communication with Friends and Family:   . Frequency of Social Gatherings with Friends and Family:   . Attends Religious Services:   . Active Member of Clubs or Organizations:   . Attends Archivist Meetings:   Marland Kitchen Marital Status:     Activities of Daily Living In your present state of health, do you have any difficulty performing the following activities: 10/25/2019  Hearing? N  Vision? N  Difficulty concentrating or making decisions? N  Walking or climbing stairs? N  Dressing or bathing? N  Doing errands, shopping? N  Preparing Food and eating ? N  Using the Toilet? N  In the  past six months, have you accidently leaked urine? N  Do you have problems with loss of bowel control? N  Managing your Medications? N  Managing your Finances? N  Housekeeping or managing your Housekeeping? N  Some recent data might be hidden    Patient Education/ Literacy How often do you need to have someone help you when you read instructions, pamphlets, or other written materials from your doctor or pharmacy?: 1 - Never What is the last grade level you completed in school?: 12  Exercise Current Exercise Habits: Structured exercise class, Type of exercise: strength training/weights;stretching;Other - see comments (bike), Time (Minutes): 60, Frequency (Times/Week):  3, Weekly Exercise (Minutes/Week): 180, Intensity: Mild, Exercise limited by: None identified  Diet Patient reports consuming 3 meals a day and 1 snack(s) a day Patient reports that his primary diet is: Regular Patient reports that she does have regular access to food.   Depression Screen PHQ 2/9 Scores 10/25/2019 08/09/2019 05/10/2019 01/31/2019 10/29/2018 09/19/2018 07/05/2018  PHQ - 2 Score 0 0 0 0 0 0 0     Fall Risk Fall Risk  10/25/2019 08/09/2019 05/10/2019 01/31/2019 10/29/2018  Falls in the past year? 0 0 0 0 0     Objective:  David Garza seemed alert and oriented and he participated appropriately during our telephone visit.  Blood Pressure Weight BMI  BP Readings from Last 3 Encounters:  08/09/19 138/87  05/10/19 127/81  01/31/19 126/76   Wt Readings from Last 3 Encounters:  08/09/19 217 lb (98.4 kg)  05/10/19 215 lb (97.5 kg)  01/31/19 215 lb (97.5 kg)   BMI Readings from Last 1 Encounters:  08/09/19 32.99 kg/m    *Unable to obtain current vital signs, weight, and BMI due to telephone visit type  Hearing/Vision  . Jerimah did not seem to have difficulty with hearing/understanding during the telephone conversation . Reports that he has not had a formal eye exam by an eye care professional within the past  year . Reports that he has not had a formal hearing evaluation within the past year *Unable to fully assess hearing and vision during telephone visit type  Cognitive Function: 6CIT Screen 10/25/2019 09/19/2018  What Year? 0 points 0 points  What month? 0 points 0 points  What time? 0 points 0 points  Count back from 20 0 points 0 points  Months in reverse 0 points 0 points  Repeat phrase 0 points 0 points  Total Score 0 0   (Normal:0-7, Significant for Dysfunction: >8)  Normal Cognitive Function Screening: Yes   Immunization & Health Maintenance Record Immunization History  Administered Date(s) Administered  . Fluad Quad(high Dose 65+) 01/31/2019  . Influenza, High Dose Seasonal PF 01/24/2018  . Influenza,inj,Quad PF,6+ Mos 03/19/2013, 02/03/2014, 01/29/2015, 02/03/2017  . Influenza-Unspecified 02/11/2016  . Moderna SARS-COVID-2 Vaccination 07/01/2019, 08/01/2019  . Pneumococcal Conjugate-13 03/14/2017  . Pneumococcal Polysaccharide-23 07/05/2018  . Tdap 09/17/2012  . Zoster 05/16/2014    Health Maintenance  Topic Date Due  . INFLUENZA VACCINE  12/01/2019  . TETANUS/TDAP  09/18/2022  . COLONOSCOPY  05/06/2023  . COVID-19 Vaccine  Completed  . Hepatitis C Screening  Completed  . PNA vac Low Risk Adult  Completed       Assessment  This is a routine wellness examination for David [place].  Health Maintenance: Due or Overdue There are no preventive care reminders to display for this patient.  David [place] does not need a referral for Community Assistance: Care Management:   no Social Work:    no Prescription Assistance:  no Nutrition/Diabetes Education:  no   Plan:  Personalized Goals Goals Addressed            This Visit's Progress   . Patient Stated       10/25/2019 AWV Goal: Keep All Scheduled Appointments  Over the next year, patient will attend all scheduled appointments with their PCP and any specialists that they see.       Personalized  Health Maintenance & Screening Recommendations    Lung Cancer Screening Recommended: no (Low Dose CT Chest recommended if Age 27-80 years, 30 pack-year currently smoking OR  have quit w/in past 15 years) Hepatitis C Screening recommended: no HIV Screening recommended: no  Advanced Directives: Written information was not prepared per patient's request.  Referrals & Orders No orders of the defined types were placed in this encounter.   Follow-up Plan . Follow-up with David Pretty, FNP as planned   I have personally reviewed and noted the following in the patient's chart:   . Medical and social history . Use of alcohol, tobacco or illicit drugs  . Current medications and supplements . Functional ability and status . Nutritional status . Physical activity . Advanced directives . List of other physicians . Hospitalizations, surgeries, and ER visits in previous 12 months . Vitals . Screenings to include cognitive, depression, and falls . Referrals and appointments  In addition, I have reviewed and discussed with David [place] certain preventive protocols, quality metrics, and best practice recommendations. A written personalized care plan for preventive services as well as general preventive health recommendations is available and can be mailed to the patient at his request.      Christia Reading, LPN  10/30/4101

## 2019-11-06 DIAGNOSIS — B078 Other viral warts: Secondary | ICD-10-CM | POA: Diagnosis not present

## 2019-11-06 DIAGNOSIS — D225 Melanocytic nevi of trunk: Secondary | ICD-10-CM | POA: Diagnosis not present

## 2019-11-06 DIAGNOSIS — L570 Actinic keratosis: Secondary | ICD-10-CM | POA: Diagnosis not present

## 2019-11-06 DIAGNOSIS — Z85828 Personal history of other malignant neoplasm of skin: Secondary | ICD-10-CM | POA: Diagnosis not present

## 2019-11-18 ENCOUNTER — Other Ambulatory Visit: Payer: Medicare HMO

## 2019-11-18 ENCOUNTER — Other Ambulatory Visit: Payer: Self-pay

## 2019-11-18 DIAGNOSIS — I10 Essential (primary) hypertension: Secondary | ICD-10-CM | POA: Diagnosis not present

## 2019-11-18 DIAGNOSIS — E781 Pure hyperglyceridemia: Secondary | ICD-10-CM

## 2019-11-18 DIAGNOSIS — R739 Hyperglycemia, unspecified: Secondary | ICD-10-CM | POA: Diagnosis not present

## 2019-11-18 LAB — BAYER DCA HB A1C WAIVED: HB A1C (BAYER DCA - WAIVED): 5.6 % (ref <7.0)

## 2019-11-19 LAB — LIPID PANEL
Chol/HDL Ratio: 4.8 ratio (ref 0.0–5.0)
Cholesterol, Total: 152 mg/dL (ref 100–199)
HDL: 32 mg/dL — ABNORMAL LOW (ref >39)
LDL Chol Calc (NIH): 87 mg/dL (ref 0–99)
Triglycerides: 192 mg/dL — ABNORMAL HIGH (ref 0–149)
VLDL Cholesterol Cal: 33 mg/dL (ref 5–40)

## 2019-11-19 LAB — CMP14+EGFR
ALT: 14 IU/L (ref 0–44)
AST: 17 IU/L (ref 0–40)
Albumin/Globulin Ratio: 2.4 — ABNORMAL HIGH (ref 1.2–2.2)
Albumin: 4.7 g/dL (ref 3.8–4.8)
Alkaline Phosphatase: 47 IU/L — ABNORMAL LOW (ref 48–121)
BUN/Creatinine Ratio: 10 (ref 10–24)
BUN: 11 mg/dL (ref 8–27)
Bilirubin Total: 0.5 mg/dL (ref 0.0–1.2)
CO2: 25 mmol/L (ref 20–29)
Calcium: 9.9 mg/dL (ref 8.6–10.2)
Chloride: 100 mmol/L (ref 96–106)
Creatinine, Ser: 1.06 mg/dL (ref 0.76–1.27)
GFR calc Af Amer: 84 mL/min/{1.73_m2} (ref >59)
GFR calc non Af Amer: 72 mL/min/{1.73_m2} (ref >59)
Globulin, Total: 2 g/dL (ref 1.5–4.5)
Glucose: 116 mg/dL — ABNORMAL HIGH (ref 65–99)
Potassium: 3.9 mmol/L (ref 3.5–5.2)
Sodium: 137 mmol/L (ref 134–144)
Total Protein: 6.7 g/dL (ref 6.0–8.5)

## 2019-11-19 LAB — CBC WITH DIFFERENTIAL/PLATELET
Basophils Absolute: 0.1 10*3/uL (ref 0.0–0.2)
Basos: 1 %
EOS (ABSOLUTE): 0.3 10*3/uL (ref 0.0–0.4)
Eos: 4 %
Hematocrit: 46.3 % (ref 37.5–51.0)
Hemoglobin: 16.6 g/dL (ref 13.0–17.7)
Immature Grans (Abs): 0 10*3/uL (ref 0.0–0.1)
Immature Granulocytes: 0 %
Lymphocytes Absolute: 1.2 10*3/uL (ref 0.7–3.1)
Lymphs: 16 %
MCH: 31.7 pg (ref 26.6–33.0)
MCHC: 35.9 g/dL — ABNORMAL HIGH (ref 31.5–35.7)
MCV: 89 fL (ref 79–97)
Monocytes Absolute: 0.6 10*3/uL (ref 0.1–0.9)
Monocytes: 8 %
Neutrophils Absolute: 5.4 10*3/uL (ref 1.4–7.0)
Neutrophils: 71 %
Platelets: 207 10*3/uL (ref 150–450)
RBC: 5.23 x10E6/uL (ref 4.14–5.80)
RDW: 13 % (ref 11.6–15.4)
WBC: 7.6 10*3/uL (ref 3.4–10.8)

## 2019-11-22 ENCOUNTER — Encounter: Payer: Self-pay | Admitting: Nurse Practitioner

## 2019-11-22 ENCOUNTER — Ambulatory Visit (INDEPENDENT_AMBULATORY_CARE_PROVIDER_SITE_OTHER): Payer: Medicare HMO | Admitting: Nurse Practitioner

## 2019-11-22 ENCOUNTER — Other Ambulatory Visit: Payer: Self-pay

## 2019-11-22 VITALS — BP 123/80 | HR 72 | Temp 98.3°F | Ht 68.0 in | Wt 214.6 lb

## 2019-11-22 DIAGNOSIS — I6930 Unspecified sequelae of cerebral infarction: Secondary | ICD-10-CM | POA: Diagnosis not present

## 2019-11-22 DIAGNOSIS — Z6833 Body mass index (BMI) 33.0-33.9, adult: Secondary | ICD-10-CM

## 2019-11-22 DIAGNOSIS — I10 Essential (primary) hypertension: Secondary | ICD-10-CM

## 2019-11-22 DIAGNOSIS — M1A0710 Idiopathic chronic gout, right ankle and foot, without tophus (tophi): Secondary | ICD-10-CM | POA: Diagnosis not present

## 2019-11-22 DIAGNOSIS — E781 Pure hyperglyceridemia: Secondary | ICD-10-CM | POA: Diagnosis not present

## 2019-11-22 DIAGNOSIS — G63 Polyneuropathy in diseases classified elsewhere: Secondary | ICD-10-CM

## 2019-11-22 DIAGNOSIS — K219 Gastro-esophageal reflux disease without esophagitis: Secondary | ICD-10-CM

## 2019-11-22 MED ORDER — DULOXETINE HCL 60 MG PO CPEP: 60.0000 mg | ORAL_CAPSULE | Freq: Every day | ORAL | 1 refills | Status: DC

## 2019-11-22 MED ORDER — PRAVASTATIN SODIUM 80 MG PO TABS: 80.0000 mg | ORAL_TABLET | Freq: Every day | ORAL | 1 refills | Status: DC

## 2019-11-22 MED ORDER — CLOPIDOGREL BISULFATE 75 MG PO TABS: 75.0000 mg | ORAL_TABLET | Freq: Every day | ORAL | 1 refills | Status: DC

## 2019-11-22 MED ORDER — LISINOPRIL-HYDROCHLOROTHIAZIDE 20-25 MG PO TABS: 1.0000 | ORAL_TABLET | Freq: Every day | ORAL | 1 refills | Status: DC

## 2019-11-22 MED ORDER — OMEPRAZOLE 40 MG PO CPDR: 40.0000 mg | DELAYED_RELEASE_CAPSULE | Freq: Every day | ORAL | 1 refills | Status: DC

## 2019-11-22 MED ORDER — GABAPENTIN 100 MG PO CAPS: 200.0000 mg | ORAL_CAPSULE | Freq: Two times a day (BID) | ORAL | 1 refills | Status: DC

## 2019-11-22 NOTE — Progress Notes (Signed)
Subjective:    Patient ID: David Garza, male    DOB: Jun 19, 1950, 69 y.o.   MRN: [id number]   Chief Complaint: Medical Management of Chronic Issues    HPI:  1. Essential hypertension No c/o chest pain, sob or headache. Does check blood pressure at home. Wife says usually runs well at home. BP Readings from Last 3 Encounters:  08/09/19 138/87  05/10/19 127/81  01/31/19 126/76     2. 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 Lab Results  Component Value Date   CHOL 152 11/18/2019   HDL 32 (L) 11/18/2019   LDLCALC 87 11/18/2019   TRIG 192 (H) 11/18/2019   CHOLHDL 4.8 11/18/2019     3. Gastroesophageal reflux disease without esophagitis Is on omeprazole daily and is doing well.  4. Late effect of cerebrovascular accident (CVA) He has very slight right sided weakness, but otherwise no deficits  5. Neuropathy due to medical condition [facility]) He has lots of numbness in his right hand from his stroke. He says it is slightly better and the cymbalta and gabapentin help a lot.  6. Idiopathic chronic gout of right foot without tophus He has had no recent out flare ups  7. BMI 33.0-33.9,adult No recent weight changes Wt Readings from Last 3 Encounters:  08/09/19 217 lb (98.4 kg)  05/10/19 215 lb (97.5 kg)  01/31/19 215 lb (97.5 kg)   BMI Readings from Last 3 Encounters:  08/09/19 32.99 kg/m  05/10/19 32.69 kg/m  01/31/19 32.69 kg/m       Outpatient Encounter Medications as of 11/22/2019  Medication Sig   clopidogrel (PLAVIX) 75 MG tablet Take 1 tablet (75 mg total) by mouth daily.   colchicine 0.6 MG tablet Take 1.2 mg (2 tablets) by mouth x1, then 0.6 mg (1 tablet) 1 hour later.   DULoxetine (CYMBALTA) 60 MG capsule Take 1 capsule (60 mg total) by mouth daily.   fish oil-omega-3 fatty acids 1000 MG capsule Take 2 g by mouth daily.   gabapentin (NEURONTIN) 100 MG capsule Take 2 capsules (200 mg total) by  mouth 2 (two) times daily.   lisinopril-hydrochlorothiazide (ZESTORETIC) 20-25 MG tablet Take 1 tablet by mouth daily.   meclizine (ANTIVERT) 12.5 MG tablet Take 1 tablet (12.5 mg total) by mouth 3 (three) times daily as needed for dizziness.   omeprazole (PRILOSEC) 40 MG capsule Take 1 capsule (40 mg total) by mouth daily.   pravastatin (PRAVACHOL) 80 MG tablet Take 1 tablet (80 mg total) by mouth daily.     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  Eyes: Negative for pain.  Respiratory: Negative for shortness of breath.   Cardiovascular: Negative for chest pain, palpitations and leg swelling.  Gastrointestinal: Negative for abdominal pain.  Endocrine: Negative for polydipsia.  Skin: Negative for rash.  Neurological: Negative for dizziness, weakness and headaches.  Hematological: Does not bruise/bleed easily.  All other systems reviewed and are negative.      Objective:   Physical Exam Vitals and nursing note reviewed.  Constitutional:      Appearance: Normal appearance. He is well-developed.  HENT:     Head: Normocephalic.     Nose: Nose normal.  Eyes:     Pupils: Pupils are equal, round, and reactive to light.  Neck:     Thyroid: No thyroid mass or thyromegaly.      Vascular: No carotid bruit or JVD.     Trachea: Phonation normal.  Cardiovascular:     Rate and Rhythm: Normal rate and regular rhythm.  Pulmonary:     Effort: Pulmonary effort is normal. No respiratory distress.     Breath sounds: Normal breath sounds.  Abdominal:     General: Bowel sounds are normal.     Palpations: Abdomen is soft.     Tenderness: There is no abdominal tenderness.  Musculoskeletal:        General: Normal range of motion.     Cervical back: Normal range of motion and neck supple.  Lymphadenopathy:     Cervical: No cervical adenopathy.  Skin:    General: Skin is warm and dry.  Neurological:     Mental Status: He is alert and oriented to person, place, and time.  Psychiatric:        Behavior: Behavior normal.        Thought Content: Thought content normal.        Judgment: Judgment normal.     BP 123/80    Pulse 72    Temp 98.3 F (36.8 C) (Temporal)    Ht 5\' 8"  (1.727 m)    Wt (!) 214 lb 9.6 oz (97.3 kg)    BMI 32.63 kg/m         Assessment & Plan:  David Garza comes in today with chief complaint of Medical Management of Chronic Issues   Diagnosis and orders addressed:  1. Essential hypertension Low sodium diet - lisinopril-hydrochlorothiazide (ZESTORETIC) 20-25 MG tablet; Take 1 tablet by mouth daily.  Dispense: 90 tablet; Refill: 1  2. Pure hypertriglyceridemia Low fat diet - pravastatin (PRAVACHOL) 80 MG tablet; Take 1 tablet (80 mg total) by mouth daily.  Dispense: 90 tablet; Refill: 1  3. Gastroesophageal reflux disease without esophagitis Avoid spicy foods Do not eat 2 hours prior to bedtime - omeprazole (PRILOSEC) 40 MG capsule; Take 1 capsule (40 mg total) by mouth daily.  Dispense: 90 capsule; Refill: 1  4. Late effect of cerebrovascular accident (CVA) Fall prevention - clopidogrel (PLAVIX) 75 MG tablet; Take 1 tablet (75 mg total) by mouth daily.  Dispense: 90 tablet; Refill: 1  5. Neuropathy due to medical condition (HCC) -  DULoxetine (CYMBALTA) 60 MG capsule; Take 1 capsule (60 mg total) by mouth daily.  Dispense: 90 capsule; Refill: 1 - gabapentin (NEURONTIN) 100 MG capsule; Take 2 capsules (200 mg total) by mouth 2 (two) times daily.  Dispense: 120 capsule; Refill: 1  6. Idiopathic chronic gout of right foot without tophus Low purine diet  7. BMI 33.0-33.9,adult Discussed diet and exercise for person with BMI >25 Will recheck weight in 3-6  months    Labs pending Health Maintenance reviewed Diet and exercise encouraged  Follow up plan: 3 months   Mary-Margaret Hassell Done, FNP

## 2019-11-22 NOTE — Patient Instructions (Signed)

## 2019-12-30 DIAGNOSIS — B078 Other viral warts: Secondary | ICD-10-CM | POA: Diagnosis not present

## 2019-12-30 DIAGNOSIS — Z85828 Personal history of other malignant neoplasm of skin: Secondary | ICD-10-CM | POA: Diagnosis not present

## 2020-01-14 DIAGNOSIS — H5200 Hypermetropia, unspecified eye: Secondary | ICD-10-CM | POA: Diagnosis not present

## 2020-01-14 DIAGNOSIS — H2513 Age-related nuclear cataract, bilateral: Secondary | ICD-10-CM | POA: Diagnosis not present

## 2020-01-14 DIAGNOSIS — Z0100 Encounter for examination of eyes and vision without abnormal findings: Secondary | ICD-10-CM | POA: Diagnosis not present

## 2020-01-24 DIAGNOSIS — R69 Illness, unspecified: Secondary | ICD-10-CM | POA: Diagnosis not present

## 2020-01-29 DIAGNOSIS — R69 Illness, unspecified: Secondary | ICD-10-CM | POA: Diagnosis not present

## 2020-02-04 DIAGNOSIS — L570 Actinic keratosis: Secondary | ICD-10-CM | POA: Diagnosis not present

## 2020-02-04 DIAGNOSIS — B078 Other viral warts: Secondary | ICD-10-CM | POA: Diagnosis not present

## 2020-02-04 DIAGNOSIS — Z85828 Personal history of other malignant neoplasm of skin: Secondary | ICD-10-CM | POA: Diagnosis not present

## 2020-03-02 ENCOUNTER — Encounter: Payer: Self-pay | Admitting: *Deleted

## 2020-03-02 ENCOUNTER — Other Ambulatory Visit: Payer: Self-pay

## 2020-03-02 ENCOUNTER — Other Ambulatory Visit: Payer: Medicare HMO

## 2020-03-02 DIAGNOSIS — R739 Hyperglycemia, unspecified: Secondary | ICD-10-CM

## 2020-03-02 DIAGNOSIS — I10 Essential (primary) hypertension: Secondary | ICD-10-CM

## 2020-03-02 DIAGNOSIS — E782 Mixed hyperlipidemia: Secondary | ICD-10-CM

## 2020-03-02 DIAGNOSIS — E781 Pure hyperglyceridemia: Secondary | ICD-10-CM

## 2020-03-02 LAB — CMP14+EGFR
ALT: 16 IU/L (ref 0–44)
AST: 16 IU/L (ref 0–40)
Albumin/Globulin Ratio: 2.1 (ref 1.2–2.2)
Albumin: 4.6 g/dL (ref 3.8–4.8)
Alkaline Phosphatase: 45 IU/L (ref 44–121)
BUN/Creatinine Ratio: 7 — ABNORMAL LOW (ref 10–24)
BUN: 8 mg/dL (ref 8–27)
Bilirubin Total: 0.7 mg/dL (ref 0.0–1.2)
CO2: 26 mmol/L (ref 20–29)
Calcium: 9.9 mg/dL (ref 8.6–10.2)
Chloride: 99 mmol/L (ref 96–106)
Creatinine, Ser: 1.14 mg/dL (ref 0.76–1.27)
GFR calc Af Amer: 77 mL/min/{1.73_m2} (ref >59)
GFR calc non Af Amer: 66 mL/min/{1.73_m2} (ref >59)
Globulin, Total: 2.2 g/dL (ref 1.5–4.5)
Glucose: 112 mg/dL — ABNORMAL HIGH (ref 65–99)
Potassium: 4.3 mmol/L (ref 3.5–5.2)
Sodium: 140 mmol/L (ref 134–144)
Total Protein: 6.8 g/dL (ref 6.0–8.5)

## 2020-03-02 LAB — CBC WITH DIFFERENTIAL/PLATELET
Basophils Absolute: 0.1 10*3/uL (ref 0.0–0.2)
Basos: 1 %
EOS (ABSOLUTE): 0.2 10*3/uL (ref 0.0–0.4)
Eos: 3 %
Hematocrit: 48.4 % (ref 37.5–51.0)
Hemoglobin: 17.1 g/dL (ref 13.0–17.7)
Immature Grans (Abs): 0 10*3/uL (ref 0.0–0.1)
Immature Granulocytes: 0 %
Lymphocytes Absolute: 1.3 10*3/uL (ref 0.7–3.1)
Lymphs: 18 %
MCH: 31.1 pg (ref 26.6–33.0)
MCHC: 35.3 g/dL (ref 31.5–35.7)
MCV: 88 fL (ref 79–97)
Monocytes Absolute: 0.5 10*3/uL (ref 0.1–0.9)
Monocytes: 7 %
Neutrophils Absolute: 5.1 10*3/uL (ref 1.4–7.0)
Neutrophils: 71 %
Platelets: 207 10*3/uL (ref 150–450)
RBC: 5.49 x10E6/uL (ref 4.14–5.80)
RDW: 12.9 % (ref 11.6–15.4)
WBC: 7.2 10*3/uL (ref 3.4–10.8)

## 2020-03-02 LAB — LIPID PANEL
Chol/HDL Ratio: 4.1 ratio (ref 0.0–5.0)
Cholesterol, Total: 140 mg/dL (ref 100–199)
HDL: 34 mg/dL — ABNORMAL LOW (ref >39)
LDL Chol Calc (NIH): 86 mg/dL (ref 0–99)
Triglycerides: 105 mg/dL (ref 0–149)
VLDL Cholesterol Cal: 20 mg/dL (ref 5–40)

## 2020-03-02 LAB — BAYER DCA HB A1C WAIVED: HB A1C (BAYER DCA - WAIVED): 5.3 % (ref <7.0)

## 2020-03-05 ENCOUNTER — Encounter: Payer: Self-pay | Admitting: Nurse Practitioner

## 2020-03-05 ENCOUNTER — Ambulatory Visit: Payer: Self-pay | Admitting: Nurse Practitioner

## 2020-03-05 ENCOUNTER — Other Ambulatory Visit: Payer: Self-pay

## 2020-03-05 ENCOUNTER — Ambulatory Visit (INDEPENDENT_AMBULATORY_CARE_PROVIDER_SITE_OTHER): Payer: Medicare HMO | Admitting: Nurse Practitioner

## 2020-03-05 VITALS — BP 128/84 | HR 80 | Temp 98.0°F | Resp 20 | Ht 68.0 in | Wt 211.0 lb

## 2020-03-05 DIAGNOSIS — G63 Polyneuropathy in diseases classified elsewhere: Secondary | ICD-10-CM | POA: Diagnosis not present

## 2020-03-05 DIAGNOSIS — Z6833 Body mass index (BMI) 33.0-33.9, adult: Secondary | ICD-10-CM

## 2020-03-05 DIAGNOSIS — K219 Gastro-esophageal reflux disease without esophagitis: Secondary | ICD-10-CM | POA: Diagnosis not present

## 2020-03-05 DIAGNOSIS — I6930 Unspecified sequelae of cerebral infarction: Secondary | ICD-10-CM

## 2020-03-05 DIAGNOSIS — M25561 Pain in right knee: Secondary | ICD-10-CM | POA: Diagnosis not present

## 2020-03-05 DIAGNOSIS — I10 Essential (primary) hypertension: Secondary | ICD-10-CM

## 2020-03-05 DIAGNOSIS — E781 Pure hyperglyceridemia: Secondary | ICD-10-CM | POA: Diagnosis not present

## 2020-03-05 MED ORDER — OMEPRAZOLE 40 MG PO CPDR: 40.0000 mg | DELAYED_RELEASE_CAPSULE | Freq: Every day | ORAL | 1 refills | Status: DC

## 2020-03-05 MED ORDER — DULOXETINE HCL 60 MG PO CPEP: 60.0000 mg | ORAL_CAPSULE | Freq: Every day | ORAL | 1 refills | Status: DC

## 2020-03-05 MED ORDER — PRAVASTATIN SODIUM 80 MG PO TABS: 80.0000 mg | ORAL_TABLET | Freq: Every day | ORAL | 1 refills | Status: DC

## 2020-03-05 MED ORDER — GABAPENTIN 100 MG PO CAPS: 200.0000 mg | ORAL_CAPSULE | Freq: Two times a day (BID) | ORAL | 1 refills | Status: DC

## 2020-03-05 MED ORDER — BUPIVACAINE HCL 0.25 % IJ SOLN
1.0000 mL | Freq: Once | INTRAMUSCULAR | Status: AC
  Administered 2020-03-05: 1 mL via INTRA_ARTICULAR

## 2020-03-05 MED ORDER — CLOPIDOGREL BISULFATE 75 MG PO TABS: 75.0000 mg | ORAL_TABLET | Freq: Every day | ORAL | 1 refills | Status: DC

## 2020-03-05 MED ORDER — LISINOPRIL-HYDROCHLOROTHIAZIDE 20-25 MG PO TABS: 1.0000 | ORAL_TABLET | Freq: Every day | ORAL | 1 refills | Status: DC

## 2020-03-05 MED ORDER — METHYLPREDNISOLONE ACETATE 40 MG/ML IJ SUSP
40.0000 mg | Freq: Once | INTRAMUSCULAR | Status: AC
  Administered 2020-03-05: 40 mg via INTRA_ARTICULAR

## 2020-03-05 NOTE — Patient Instructions (Signed)
Joint Steroid Injection A joint steroid injection is a procedure to relieve swelling and pain in a joint. Steroids are medicines that reduce inflammation. In this procedure, your health care provider uses a syringe and a needle to inject a steroid medicine into a painful and inflamed joint. A pain-relieving medicine (anesthetic) may be injected along with the steroid. In some cases, your health care provider may use an imaging technique such as ultrasound or fluoroscopy to guide the injection. Joints that are often treated with steroid injections include the knee, shoulder, hip, and spine. These injections may also be used in the elbow, ankle, and joints of the hands or feet. You may have joint steroid injections as part of your treatment for inflammation caused by:  Gout.  Rheumatoid arthritis.  Advanced wear-and-tear arthritis (osteoarthritis).  Tendinitis.  Bursitis. Joint steroid injections may be repeated, but having them too often can damage a joint or the skin over the joint. You should not have joint steroid injections less than 6 weeks apart or more than four times a year. Tell a health care provider about:  Any allergies you have.  All medicines you are taking, including vitamins, herbs, eye drops, creams, and over-the-counter medicines.  Any problems you or family members have had with anesthetic medicines.  Any blood disorders you have.  Any surgeries you have had.  Any medical conditions you have.  Whether you are pregnant or may be pregnant. What are the risks? Generally, this is a safe treatment. However, problems may occur, including:  Infection.  Bleeding.  Allergic reactions to medicines.  Damage to the joint or tissues around the joint.  Thinning of skin or loss of skin color over the joint.  Temporary flushing of the face or chest.  Temporary increase in pain.  Temporary increase in blood sugar.  Failure to relieve inflammation or pain. What  happens before the treatment?  You may have imaging tests of your joint.  Ask your health care provider about: ? Changing or stopping your regular medicines. This is especially important if you are taking diabetes medicines or blood thinners. ? Taking medicines such as aspirin and ibuprofen. These medicines can thin your blood. Do not take these medicines unless your health care provider tells you to take them. ? Taking over-the-counter medicines, vitamins, herbs, and supplements.  Ask your health care provider if you can drive yourself home after the procedure. What happens during the treatment?   Your health care provider will position you for the injection and locate the injection site over your joint.  The skin over the joint will be cleaned with a germ-killing soap.  Your health care provider may: ? Spray a numbing solution (topical anesthetic) over the injection site. ? Inject a local anesthetic under the skin above your joint.  The needle will be placed through your skin into your joint. Your health care provider may use imaging to guide the needle to the right spot for the injection. If imaging is used, a special contrast dye may be injected to confirm that the needle is in the correct location.  The steroid medicine will be injected into your joint.  Anesthetic may be injected along with the steroid. This may be a medicine that relieves pain for a short time (short-acting anesthetic) or for a longer time (long-acting anesthetic).  The needle will be removed, and an adhesive bandage (dressing) will be placed over the injection site. The procedure may vary among health care providers and hospitals. What can I   expect after the treatment?  You will be able to go home after the treatment.  It is normal to feel slight flushing for a few days after the injection.  After the treatment, it is common to have an increase in joint pain after the anesthetic has worn off. This may  happen about an hour after a short-acting anesthetic or about 8 hours after a longer-acting anesthetic.  You should begin to feel relief from joint pain and swelling after 24 to 48 hours. Follow these instructions at home: Injection site care  Leave the adhesive dressing over your injection site in place until your health care provider says you can remove it.  Check your injection site every day for signs of infection. Check for: ? Redness, swelling, or pain. ? Fluid or blood. ? Warmth. ? Pus or a bad smell. Activity  Return to your normal activities as told by your health care provider. Ask your health care provider what activities are safe for you. You may be asked to limit activities that put stress on the joint for a few days.  Do joint exercises as told by your health care provider.  Do not take baths, swim, or use a hot tub until your health care provider approves. Managing pain, stiffness, and swelling   If directed, put ice on the joint. ? Put ice in a plastic bag. ? Place a towel between your skin and the bag. ? Leave the ice on for 20 minutes, 2-3 times a day.  Raise (elevate) your joint above the level of your heart when you are sitting or lying down. General instructions  Take over-the-counter and prescription medicines only as told by your health care provider.  Do not use any products that contain nicotine or tobacco, such as cigarettes, e-cigarettes, and chewing tobacco. These can delay joint healing. If you need help quitting, ask your health care provider.  If you have diabetes, be aware that your blood sugar may be slightly elevated for several days after the injection.  Keep all follow-up visits as told by your health care provider. This is important. Contact a health care provider if you have:  Chills or a fever.  Any signs of infection at your injection site.  Increased pain or swelling or no relief after 2 days. Summary  A joint steroid injection  is a treatment to relieve pain and swelling in a joint.  Steroids are medicines that reduce inflammation. Your health care provider may add an anesthetic along with the steroid.  You may have joint steroid injections as part of your arthritis treatment.  Joint steroid injections may be repeated, but having them too often can damage a joint or the skin over the joint.  Contact your health care provider if you have a fever, chills, or signs of infection or if you get no relief from joint pain or swelling. This information is not intended to replace advice given to you by your health care provider. Make sure you discuss any questions you have with your health care provider. Document Revised: 12/19/2017 Document Reviewed: 12/19/2017 Elsevier Patient Education  2020 [employer]  

## 2020-03-05 NOTE — Progress Notes (Signed)
Subjective:    Patient ID: David Garza, male    DOB: July 15, 1950, 69 y.o.   MRN: [id number]   Chief Complaint: No chief complaint on file.    HPI:  1. Primary hypertension BP Readings from Last 3 Encounters:  03/05/20 128/84  11/22/19 123/80  08/09/19 138/87  Takes medication as prescribed, checks blood pressure at home regularly. Does not watch diet closely. Avoids salt. Denies SOB, chest pain, or weakness.    2. Gastroesophageal reflux disease without esophagitis Takes medication as needed, not regularly. States he will take it when he knows he is eating a trigger food. Does not lie down after meals.   3. Pure hypertriglyceridemia Lab Results  Component Value Date   CHOL 140 03/02/2020   HDL 34 (L) 03/02/2020   LDLCALC 86 03/02/2020   TRIG 105 03/02/2020   CHOLHDL 4.1 03/02/2020  Takes medication as prescribed. States although he has changed medication he is still experiences some joint pain in right leg.    4. BMI 33.0-33.9,adult Wt Readings from Last 3 Encounters:  03/05/20 211 lb (95.7 kg)  11/22/19 (!) 214 lb 9.6 oz (97.3 kg)  08/09/19 217 lb (98.4 kg)   BMI Readings from Last 3 Encounters:  03/05/20 32.08 kg/m  11/22/19 32.63 kg/m  08/09/19 32.99 kg/m   Exercises 3 times a week. Rides bike, stretches, abs, some weights/body weight exercises.     Outpatient Encounter Medications as of 03/05/2020  Medication Sig  . clopidogrel (PLAVIX) 75 MG tablet Take 1 tablet (75 mg total) by mouth daily.  . colchicine 0.6 MG tablet Take 1.2 mg (2 tablets) by mouth x1, then 0.6 mg (1 tablet) 1 hour later.  . DULoxetine (CYMBALTA) 60 MG capsule Take 1 capsule (60 mg total) by mouth daily.  . fish oil-omega-3 fatty acids 1000 MG capsule Take 2 g by mouth daily.  Marland Kitchen gabapentin (NEURONTIN) 100 MG capsule Take 2 capsules (200 mg total) by mouth 2 (two) times daily.  Marland Kitchen lisinopril-hydrochlorothiazide (ZESTORETIC) 20-25 MG tablet Take 1 tablet by mouth daily.  .  meclizine (ANTIVERT) 12.5 MG tablet Take 1 tablet (12.5 mg total) by mouth 3 (three) times daily as needed for dizziness.  Marland Kitchen omeprazole (PRILOSEC) 40 MG capsule Take 1 capsule (40 mg total) by mouth daily.  . pravastatin (PRAVACHOL) 80 MG tablet Take 1 tablet (80 mg total) by mouth daily.   No facility-administered encounter medications on file as of 03/05/2020.    Past Surgical History:  Procedure Laterality Date  . KNEE ARTHROSCOPY Right   . SHOULDER SURGERY Right     Family History  Problem Relation Age of Onset  . Cancer Mother        pancreatic  . Alzheimer's disease Mother 41  . Diabetes Sister   . Aneurysm Sister        questionable - possible heart attack   . Coronary artery disease Maternal Grandfather   . Alcohol abuse Maternal Grandfather   . GI Disease Maternal Grandfather   . Early death Father   . Alzheimer's disease Paternal Uncle 74  . Aneurysm Paternal Uncle   . Cancer Sister        male    New complaints: No new complaints.   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   Controlled substance contract: n/a     Review of Systems  Constitutional: Negative.   HENT: Negative.   Eyes: Negative.   Respiratory: Negative.   Cardiovascular:  Negative.   Gastrointestinal: Negative.   Endocrine: Negative.   Genitourinary: Negative.   Musculoskeletal: Positive for arthralgias.       Right knee pain  Skin: Negative.   Allergic/Immunologic: Negative.   Neurological: Negative.   Hematological: Negative.   Psychiatric/Behavioral: Negative.   All other systems reviewed and are negative.      Objective:   Physical Exam Vitals and nursing note reviewed.  Constitutional:      Appearance: Normal appearance.  HENT:     Head: Normocephalic and atraumatic.     Right Ear: Tympanic membrane, ear canal and external ear normal.     Left Ear: Tympanic membrane, ear canal and external ear normal.     Nose: Nose normal.      Mouth/Throat:     Mouth: Mucous membranes are moist.     Pharynx: Oropharynx is clear.  Eyes:     Extraocular Movements: Extraocular movements intact.     Conjunctiva/sclera: Conjunctivae normal.     Pupils: Pupils are equal, round, and reactive to light.  Cardiovascular:     Rate and Rhythm: Normal rate and regular rhythm.     Pulses: Normal pulses.     Heart sounds: Normal heart sounds.  Pulmonary:     Effort: Pulmonary effort is normal.     Breath sounds: Normal breath sounds.  Abdominal:     General: Abdomen is flat. Bowel sounds are normal.     Palpations: Abdomen is soft.  Musculoskeletal:        General: Normal range of motion.     Cervical back: Normal range of motion and neck supple.  Skin:    General: Skin is warm and dry.     Capillary Refill: Capillary refill takes less than 2 seconds.  Neurological:     Mental Status: He is alert and oriented to person, place, and time. Mental status is at baseline.  Psychiatric:        Mood and Affect: Mood normal.        Behavior: Behavior normal.        Thought Content: Thought content normal.        Judgment: Judgment normal.    BP 128/84   Pulse 80   Temp 98 F (36.7 C) (Temporal)   Resp 20   Ht 5\' 8"  (1.727 m)   Wt 211 lb (95.7 kg)   SpO2 95%   BMI 32.08 kg/m   Joint Injection/Arthrocentesis  Date/Time: 03/05/2020 9:00 AM Performed by: [name], FNP Authorized by: Hassell Done Mary-Margaret, FNP  Indications: pain  Body area: knee Joint: right knee Local anesthesia used: no  Anesthesia: Local anesthesia used: no  Sedation: Patient sedated: no  Preparation: Patient was prepped and draped in the usual sterile fashion. Needle size: 22 G Ultrasound guidance: no Approach: inferior Aspirate amount: 0 mL Methylprednisolone amount: 40 mg Bupivacaine 0.25% amount: 1 mL Patient tolerance: patient tolerated the procedure well with no immediate complications        Assessment & Plan:  Raedyn Wenke comes in today with chief complaint of Medical Management of Chronic Issues   Diagnosis and orders addressed:  1. Primary hypertension Take medication as prescribed and take blood pressure regularly. Avoid high salt foods and eat a heart healthy diet.   2. Gastroesophageal reflux disease without esophagitis Take medication as prescribed. Avoid trigger foods such as spicy or fatty foods.   3. Pure hypertriglyceridemia Take medication as prescribed. Avoid foods that are high in fat or fried.  4. BMI 33.0-33.9,adult Exercise regularly. Walking and swimming are great cardiovascular exercises that lower blood pressure, cholesterol levels, and help keep weight down.   5 Knee pain Joint injection ( see notes)  Health Maintenance reviewed Diet and exercise encouraged  Follow up plan: Follow up in 3 months.    Mary-Margaret Hassell Done, FNP

## 2020-04-06 DIAGNOSIS — R69 Illness, unspecified: Secondary | ICD-10-CM | POA: Diagnosis not present

## 2020-04-20 DIAGNOSIS — R69 Illness, unspecified: Secondary | ICD-10-CM | POA: Diagnosis not present

## 2020-06-08 ENCOUNTER — Other Ambulatory Visit: Payer: Self-pay

## 2020-06-08 ENCOUNTER — Other Ambulatory Visit: Payer: Medicare HMO

## 2020-06-08 DIAGNOSIS — Z125 Encounter for screening for malignant neoplasm of prostate: Secondary | ICD-10-CM | POA: Diagnosis not present

## 2020-06-08 DIAGNOSIS — E781 Pure hyperglyceridemia: Secondary | ICD-10-CM | POA: Diagnosis not present

## 2020-06-08 DIAGNOSIS — R739 Hyperglycemia, unspecified: Secondary | ICD-10-CM | POA: Diagnosis not present

## 2020-06-08 DIAGNOSIS — I10 Essential (primary) hypertension: Secondary | ICD-10-CM | POA: Diagnosis not present

## 2020-06-08 LAB — BAYER DCA HB A1C WAIVED: HB A1C (BAYER DCA - WAIVED): 5.6 % (ref <7.0)

## 2020-06-09 LAB — CBC WITH DIFFERENTIAL/PLATELET
Basophils Absolute: 0.1 10*3/uL (ref 0.0–0.2)
Basos: 1 %
EOS (ABSOLUTE): 0.3 10*3/uL (ref 0.0–0.4)
Eos: 4 %
Hematocrit: 47.9 % (ref 37.5–51.0)
Hemoglobin: 16.6 g/dL (ref 13.0–17.7)
Immature Grans (Abs): 0 10*3/uL (ref 0.0–0.1)
Immature Granulocytes: 0 %
Lymphocytes Absolute: 1.2 10*3/uL (ref 0.7–3.1)
Lymphs: 17 %
MCH: 30 pg (ref 26.6–33.0)
MCHC: 34.7 g/dL (ref 31.5–35.7)
MCV: 87 fL (ref 79–97)
Monocytes Absolute: 0.5 10*3/uL (ref 0.1–0.9)
Monocytes: 7 %
Neutrophils Absolute: 5.1 10*3/uL (ref 1.4–7.0)
Neutrophils: 71 %
Platelets: 239 10*3/uL (ref 150–450)
RBC: 5.54 x10E6/uL (ref 4.14–5.80)
RDW: 12.1 % (ref 11.6–15.4)
WBC: 7.3 10*3/uL (ref 3.4–10.8)

## 2020-06-09 LAB — CMP14+EGFR
ALT: 17 IU/L (ref 0–44)
AST: 17 IU/L (ref 0–40)
Albumin/Globulin Ratio: 2.2 (ref 1.2–2.2)
Albumin: 4.9 g/dL — ABNORMAL HIGH (ref 3.8–4.8)
Alkaline Phosphatase: 48 IU/L (ref 44–121)
BUN/Creatinine Ratio: 10 (ref 10–24)
BUN: 10 mg/dL (ref 8–27)
Bilirubin Total: 0.8 mg/dL (ref 0.0–1.2)
CO2: 23 mmol/L (ref 20–29)
Calcium: 10.1 mg/dL (ref 8.6–10.2)
Chloride: 99 mmol/L (ref 96–106)
Creatinine, Ser: 1.05 mg/dL (ref 0.76–1.27)
GFR calc Af Amer: 84 mL/min/{1.73_m2} (ref >59)
GFR calc non Af Amer: 73 mL/min/{1.73_m2} (ref >59)
Globulin, Total: 2.2 g/dL (ref 1.5–4.5)
Glucose: 118 mg/dL — ABNORMAL HIGH (ref 65–99)
Potassium: 4.3 mmol/L (ref 3.5–5.2)
Sodium: 140 mmol/L (ref 134–144)
Total Protein: 7.1 g/dL (ref 6.0–8.5)

## 2020-06-09 LAB — LIPID PANEL
Chol/HDL Ratio: 4.9 ratio (ref 0.0–5.0)
Cholesterol, Total: 162 mg/dL (ref 100–199)
HDL: 33 mg/dL — ABNORMAL LOW (ref >39)
LDL Chol Calc (NIH): 95 mg/dL (ref 0–99)
Triglycerides: 198 mg/dL — ABNORMAL HIGH (ref 0–149)
VLDL Cholesterol Cal: 34 mg/dL (ref 5–40)

## 2020-06-09 LAB — PSA, TOTAL AND FREE
PSA, Free Pct: 66 %
PSA, Free: 0.33 ng/mL
Prostate Specific Ag, Serum: 0.5 ng/mL (ref 0.0–4.0)

## 2020-06-11 ENCOUNTER — Ambulatory Visit (INDEPENDENT_AMBULATORY_CARE_PROVIDER_SITE_OTHER): Payer: Medicare HMO | Admitting: Nurse Practitioner

## 2020-06-11 ENCOUNTER — Other Ambulatory Visit: Payer: Self-pay

## 2020-06-11 ENCOUNTER — Encounter: Payer: Self-pay | Admitting: Nurse Practitioner

## 2020-06-11 VITALS — BP 151/95 | HR 69 | Temp 97.6°F | Resp 20 | Ht 68.0 in | Wt 215.0 lb

## 2020-06-11 DIAGNOSIS — E781 Pure hyperglyceridemia: Secondary | ICD-10-CM

## 2020-06-11 DIAGNOSIS — M1A0710 Idiopathic chronic gout, right ankle and foot, without tophus (tophi): Secondary | ICD-10-CM | POA: Diagnosis not present

## 2020-06-11 DIAGNOSIS — I10 Essential (primary) hypertension: Secondary | ICD-10-CM | POA: Diagnosis not present

## 2020-06-11 DIAGNOSIS — I6930 Unspecified sequelae of cerebral infarction: Secondary | ICD-10-CM

## 2020-06-11 DIAGNOSIS — G63 Polyneuropathy in diseases classified elsewhere: Secondary | ICD-10-CM | POA: Diagnosis not present

## 2020-06-11 DIAGNOSIS — K219 Gastro-esophageal reflux disease without esophagitis: Secondary | ICD-10-CM

## 2020-06-11 DIAGNOSIS — Z6833 Body mass index (BMI) 33.0-33.9, adult: Secondary | ICD-10-CM

## 2020-06-11 MED ORDER — GABAPENTIN 100 MG PO CAPS: 200.0000 mg | ORAL_CAPSULE | Freq: Two times a day (BID) | ORAL | 1 refills | Status: DC

## 2020-06-11 MED ORDER — DULOXETINE HCL 60 MG PO CPEP: 60.0000 mg | ORAL_CAPSULE | Freq: Every day | ORAL | 1 refills | Status: DC

## 2020-06-11 MED ORDER — LISINOPRIL-HYDROCHLOROTHIAZIDE 20-25 MG PO TABS
1.0000 | ORAL_TABLET | Freq: Every day | ORAL | 1 refills | Status: DC
Start: 2020-06-11 — End: 2020-09-08

## 2020-06-11 MED ORDER — CLOPIDOGREL BISULFATE 75 MG PO TABS
75.0000 mg | ORAL_TABLET | Freq: Every day | ORAL | 1 refills | Status: DC
Start: 2020-06-11 — End: 2020-09-08

## 2020-06-11 MED ORDER — PRAVASTATIN SODIUM 80 MG PO TABS: 80.0000 mg | ORAL_TABLET | Freq: Every day | ORAL | 1 refills | Status: DC

## 2020-06-11 MED ORDER — OMEPRAZOLE 40 MG PO CPDR: 40.0000 mg | DELAYED_RELEASE_CAPSULE | Freq: Every day | ORAL | 1 refills | Status: DC

## 2020-06-11 NOTE — Patient Instructions (Signed)

## 2020-06-11 NOTE — Progress Notes (Signed)
Subjective:    Patient ID: David Garza, male    DOB: 1951-03-16, 70 y.o.   MRN: [id number]   Chief Complaint: Follow up for chronic disease management.    HPI:  1. Primary hypertension Tolerating Lisinopril/hctz well. Low sodium diet. Had potato chips last night, so this is why he may be HTN today. Checks BP at home "once in a while."   BP Readings from Last 3 Encounters:  06/11/20 (!) 151/95  03/05/20 128/84  11/22/19 123/80     2. Pure hypertriglyceridemia Tolerating Pravastatin well. Low fat diet.   Lab Results  Component Value Date   CHOL 162 06/08/2020   HDL 33 (L) 06/08/2020   LDLCALC 95 06/08/2020   TRIG 198 (H) 06/08/2020   CHOLHDL 4.9 06/08/2020     3. Idiopathic chronic gout of right foot without tophus Taking colchicine with flare-ups. Been > 47mo since last one. Watches diet to avoid flare-ups.   4. Gastroesophageal reflux disease without esophagitis Taking omperazole, working well for him. Avoids acidic foods. No complaints.   5. Neuropathy due to medical condition (HCC) Taking gabapentin. Continues to have neuropathy pain in bilateral feet and hands.   6. BMI 33.0-33.9,adult Low fat diet.   Wt Readings from Last 3 Encounters:  06/11/20 215 lb (97.5 kg)  03/05/20 211 lb (95.7 kg)  11/22/19 (!) 214 lb 9.6 oz (97.3 kg)     Outpatient Encounter Medications as of 06/11/2020  Medication Sig  . clopidogrel (PLAVIX) 75 MG tablet Take 1 tablet (75 mg total) by mouth daily.  . colchicine 0.6 MG tablet Take 1.2 mg (2 tablets) by mouth x1, then 0.6 mg (1 tablet) 1 hour later.  . DULoxetine (CYMBALTA) 60 MG capsule Take 1 capsule (60 mg total) by mouth daily.  . fish oil-omega-3 fatty acids 1000 MG capsule Take 2 g by mouth daily.  Marland Kitchen gabapentin (NEURONTIN) 100 MG capsule Take 2 capsules (200 mg total) by mouth 2 (two) times daily.  Marland Kitchen lisinopril-hydrochlorothiazide (ZESTORETIC) 20-25 MG tablet Take 1 tablet by mouth daily.  . meclizine (ANTIVERT) 12.5  MG tablet Take 1 tablet (12.5 mg total) by mouth 3 (three) times daily as needed for dizziness.  Marland Kitchen omeprazole (PRILOSEC) 40 MG capsule Take 1 capsule (40 mg total) by mouth daily.  . pravastatin (PRAVACHOL) 80 MG tablet Take 1 tablet (80 mg total) by mouth daily.   No facility-administered encounter medications on file as of 06/11/2020.    Past Surgical History:  Procedure Laterality Date  . KNEE ARTHROSCOPY Right   . SHOULDER SURGERY Right     Family History  Problem Relation Age of Onset  . Cancer Mother        pancreatic  . Alzheimer's disease Mother 52  . Diabetes Sister   . Aneurysm Sister        questionable - possible heart attack   . Coronary artery disease Maternal Grandfather   . Alcohol abuse Maternal Grandfather   . GI Disease Maternal Grandfather   . Early death Father   . Alzheimer's disease Paternal Uncle 68  . Aneurysm Paternal Uncle   . Cancer Sister        male    New complaints: Right knee pain. May need a consult for a knee replacement.   Social history: Lives with spouse who helps with medications and doctors appointments.   Controlled substance contract: N/A     Review of Systems  Constitutional: Negative for activity change and appetite change.  Respiratory:  Negative for cough, shortness of breath and wheezing.   Cardiovascular: Negative for chest pain, palpitations and leg swelling.  Gastrointestinal: Negative for abdominal pain, constipation and diarrhea.  Genitourinary: Negative for difficulty urinating and dysuria.  Musculoskeletal: Positive for arthralgias. Negative for joint swelling.  Neurological: Negative for dizziness, numbness and headaches.  Psychiatric/Behavioral: Negative for agitation and confusion.       Objective:   Physical Exam Constitutional:      General: He is not in acute distress.    Appearance: He is not ill-appearing.  Cardiovascular:     Rate and Rhythm: Normal rate and regular rhythm.     Pulses: Normal  pulses.     Heart sounds: Normal heart sounds.  Pulmonary:     Effort: Pulmonary effort is normal.     Breath sounds: Normal breath sounds.  Abdominal:     General: Bowel sounds are normal.     Palpations: Abdomen is soft.  Musculoskeletal:        General: Normal range of motion.     Cervical back: Normal range of motion and neck supple. No rigidity.  Skin:    General: Skin is warm and dry.     Capillary Refill: Capillary refill takes less than 2 seconds.  Neurological:     Mental Status: He is oriented to person, place, and time.  Psychiatric:        Mood and Affect: Mood normal.        Behavior: Behavior normal.        Thought Content: Thought content normal.        Judgment: Judgment normal.     BP recheck 142/82  Vitals:   06/11/20 0826  BP: (!) 151/95  Pulse: 69  Resp: 20  Temp: 97.6 F (36.4 C)  SpO2: 98%        Assessment & Plan:   David Garza comes in today with chief complaint of No chief complaint on file.   Diagnosis and orders addressed:   2. Pure hypertriglyceridemia Avoid fried foods, high fat foods. Continue pravastain.  - pravastatin (PRAVACHOL) 80 MG tablet; Take 1 tablet (80 mg total) by mouth daily.  Dispense: 90 tablet; Refill: 1  3. Idiopathic chronic gout of right foot without tophus Continue low nitrate diets, avoiding seafood, processed meats.   4. Gastroesophageal reflux disease without esophagitis Continue omeperazole, low acidic diet, do not eat within 2 hours of bedtime.  - omeprazole (PRILOSEC) 40 MG capsule; Take 1 capsule (40 mg total) by mouth daily.  Dispense: 90 capsule; Refill: 1  5. Neuropathy due to medical condition (HCC) Continue Neurontin and Cymbalta.   - DULoxetine (CYMBALTA) 60 MG capsule; Take 1 capsule (60 mg total) by mouth daily.  Dispense: 90 capsule; Refill: 1 - gabapentin (NEURONTIN) 100 MG capsule; Take 2 capsules (200 mg total) by mouth 2 (two) times daily.  Dispense: 120 capsule; Refill: 1  6.  BMI 33.0-33.9,adult Low fat diet, low sodium diet. Try to cook more at home versus take out.   7. Essential hypertension Avoid salty foods and take out. Monitor BP daily for the next couple of weeks. Report to the office is BP is consistently >140/80   - lisinopril-hydrochlorothiazide (ZESTORETIC) 20-25 MG tablet; Take 1 tablet by mouth daily.  Dispense: 90 tablet; Refill: 1  8. Late effect of cerebrovascular accident (CVA) Continue low fat diet, daily exercise on the farm. Watch BP at home daily.   - clopidogrel (PLAVIX) 75 MG tablet; Take 1 tablet (75  mg total) by mouth daily.  Dispense: 90 tablet; Refill: 1   Labs pending Health Maintenance reviewed Diet and exercise encouraged  Follow up plan: 3 months  Dollene Primrose, RN, BSN, FNP-Student  Mary-Margaret Hassell Done, FNP

## 2020-09-03 ENCOUNTER — Other Ambulatory Visit: Payer: Medicare HMO

## 2020-09-03 ENCOUNTER — Other Ambulatory Visit: Payer: Self-pay

## 2020-09-03 DIAGNOSIS — E781 Pure hyperglyceridemia: Secondary | ICD-10-CM | POA: Diagnosis not present

## 2020-09-03 DIAGNOSIS — I10 Essential (primary) hypertension: Secondary | ICD-10-CM

## 2020-09-03 DIAGNOSIS — R739 Hyperglycemia, unspecified: Secondary | ICD-10-CM | POA: Diagnosis not present

## 2020-09-03 LAB — CBC WITH DIFFERENTIAL/PLATELET
Basophils Absolute: 0.1 10*3/uL (ref 0.0–0.2)
Basos: 1 %
EOS (ABSOLUTE): 0.3 10*3/uL (ref 0.0–0.4)
Eos: 4 %
Hematocrit: 44 % (ref 37.5–51.0)
Hemoglobin: 15.4 g/dL (ref 13.0–17.7)
Immature Grans (Abs): 0 10*3/uL (ref 0.0–0.1)
Immature Granulocytes: 0 %
Lymphocytes Absolute: 1 10*3/uL (ref 0.7–3.1)
Lymphs: 14 %
MCH: 30.4 pg (ref 26.6–33.0)
MCHC: 35 g/dL (ref 31.5–35.7)
MCV: 87 fL (ref 79–97)
Monocytes Absolute: 0.5 10*3/uL (ref 0.1–0.9)
Monocytes: 8 %
Neutrophils Absolute: 5.2 10*3/uL (ref 1.4–7.0)
Neutrophils: 73 %
Platelets: 206 10*3/uL (ref 150–450)
RBC: 5.07 x10E6/uL (ref 4.14–5.80)
RDW: 12.5 % (ref 11.6–15.4)
WBC: 7.2 10*3/uL (ref 3.4–10.8)

## 2020-09-03 LAB — LIPID PANEL
Chol/HDL Ratio: 4.7 ratio (ref 0.0–5.0)
Cholesterol, Total: 140 mg/dL (ref 100–199)
HDL: 30 mg/dL — ABNORMAL LOW (ref >39)
LDL Chol Calc (NIH): 82 mg/dL (ref 0–99)
Triglycerides: 157 mg/dL — ABNORMAL HIGH (ref 0–149)
VLDL Cholesterol Cal: 28 mg/dL (ref 5–40)

## 2020-09-03 LAB — CMP14+EGFR
ALT: 19 IU/L (ref 0–44)
AST: 18 IU/L (ref 0–40)
Albumin/Globulin Ratio: 2 (ref 1.2–2.2)
Albumin: 4.3 g/dL (ref 3.8–4.8)
Alkaline Phosphatase: 49 IU/L (ref 44–121)
BUN/Creatinine Ratio: 12 (ref 10–24)
BUN: 16 mg/dL (ref 8–27)
Bilirubin Total: 1.1 mg/dL (ref 0.0–1.2)
CO2: 22 mmol/L (ref 20–29)
Calcium: 9.6 mg/dL (ref 8.6–10.2)
Chloride: 100 mmol/L (ref 96–106)
Creatinine, Ser: 1.31 mg/dL — ABNORMAL HIGH (ref 0.76–1.27)
Globulin, Total: 2.2 g/dL (ref 1.5–4.5)
Glucose: 111 mg/dL — ABNORMAL HIGH (ref 65–99)
Potassium: 3.9 mmol/L (ref 3.5–5.2)
Sodium: 138 mmol/L (ref 134–144)
Total Protein: 6.5 g/dL (ref 6.0–8.5)
eGFR: 59 mL/min/{1.73_m2} — ABNORMAL LOW (ref >59)

## 2020-09-03 LAB — BAYER DCA HB A1C WAIVED: HB A1C (BAYER DCA - WAIVED): 5.6 % (ref <7.0)

## 2020-09-08 ENCOUNTER — Ambulatory Visit (INDEPENDENT_AMBULATORY_CARE_PROVIDER_SITE_OTHER): Payer: Medicare HMO | Admitting: Nurse Practitioner

## 2020-09-08 ENCOUNTER — Encounter: Payer: Self-pay | Admitting: Nurse Practitioner

## 2020-09-08 ENCOUNTER — Other Ambulatory Visit: Payer: Self-pay

## 2020-09-08 VITALS — BP 140/90 | HR 69 | Temp 98.6°F | Resp 20 | Ht 68.0 in | Wt 212.0 lb

## 2020-09-08 DIAGNOSIS — I6930 Unspecified sequelae of cerebral infarction: Secondary | ICD-10-CM | POA: Diagnosis not present

## 2020-09-08 DIAGNOSIS — M1A0710 Idiopathic chronic gout, right ankle and foot, without tophus (tophi): Secondary | ICD-10-CM

## 2020-09-08 DIAGNOSIS — Z6833 Body mass index (BMI) 33.0-33.9, adult: Secondary | ICD-10-CM

## 2020-09-08 DIAGNOSIS — K219 Gastro-esophageal reflux disease without esophagitis: Secondary | ICD-10-CM

## 2020-09-08 DIAGNOSIS — G63 Polyneuropathy in diseases classified elsewhere: Secondary | ICD-10-CM | POA: Diagnosis not present

## 2020-09-08 DIAGNOSIS — I10 Essential (primary) hypertension: Secondary | ICD-10-CM

## 2020-09-08 DIAGNOSIS — E781 Pure hyperglyceridemia: Secondary | ICD-10-CM

## 2020-09-08 MED ORDER — GABAPENTIN 100 MG PO CAPS: 200.0000 mg | ORAL_CAPSULE | Freq: Two times a day (BID) | ORAL | 1 refills | Status: DC

## 2020-09-08 MED ORDER — LISINOPRIL-HYDROCHLOROTHIAZIDE 20-25 MG PO TABS: 1.0000 | ORAL_TABLET | Freq: Every day | ORAL | 1 refills | Status: DC

## 2020-09-08 MED ORDER — DULOXETINE HCL 60 MG PO CPEP: 60.0000 mg | ORAL_CAPSULE | Freq: Every day | ORAL | 1 refills | Status: DC

## 2020-09-08 MED ORDER — OMEPRAZOLE 40 MG PO CPDR: 40.0000 mg | DELAYED_RELEASE_CAPSULE | Freq: Every day | ORAL | 1 refills | Status: DC

## 2020-09-08 MED ORDER — CLOPIDOGREL BISULFATE 75 MG PO TABS: 75.0000 mg | ORAL_TABLET | Freq: Every day | ORAL | 1 refills | Status: DC

## 2020-09-08 MED ORDER — PRAVASTATIN SODIUM 80 MG PO TABS: 80.0000 mg | ORAL_TABLET | Freq: Every day | ORAL | 1 refills | Status: DC

## 2020-09-08 NOTE — Progress Notes (Signed)
Subjective:    Patient ID: David Garza, male    DOB: January 24, 1951, 70 y.o.   MRN: [id number]   Chief Complaint: Medical Management of Chronic Issues    HPI:  1. Primary hypertension No c/o chest pain, sob or headache. Does not check blood pressure at home. BP Readings from Last 3 Encounters:  09/08/20 140/90  06/11/20 (!) 151/95  03/05/20 128/84     2. 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 Lab Results  Component Value Date   CHOL 140 09/03/2020   HDL 30 (L) 09/03/2020   LDLCALC 82 09/03/2020   TRIG 157 (H) 09/03/2020   CHOLHDL 4.7 09/03/2020     3. Gastroesophageal reflux disease without esophagitis Is on omeprazole daily and is doing well.  4. Neuropathy due to medical condition ([place]) mainly in right hand from stroke. He is starting to get use to h ow it feels. neurontin helps  5. Idiopathic chronic gout of right foot without tophus No recent flare ups  6. Late effect of cerebrovascular accident (CVA) Only late effect he has is neuropathy previously mentioned  7. BMI 33.0-33.9,adult No recent weight changes Wt Readings from Last 3 Encounters:  09/08/20 212 lb (96.2 kg)  06/11/20 215 lb (97.5 kg)  03/05/20 211 lb (95.7 kg)   BMI Readings from Last 3 Encounters:  09/08/20 32.23 kg/m  06/11/20 32.69 kg/m  03/05/20 32.08 kg/m       Outpatient Encounter Medications as of 09/08/2020  Medication Sig  . clopidogrel (PLAVIX) 75 MG tablet Take 1 tablet (75 mg total) by mouth daily.  . colchicine 0.6 MG tablet Take 1.2 mg (2 tablets) by mouth x1, then 0.6 mg (1 tablet) 1 hour later.  . DULoxetine (CYMBALTA) 60 MG capsule Take 1 capsule (60 mg total) by mouth daily.  . fish oil-omega-3 fatty acids 1000 MG capsule Take 2 g by mouth daily.  Marland Kitchen gabapentin (NEURONTIN) 100 MG capsule Take 2 capsules (200 mg total) by mouth 2 (two) times daily.  Marland Kitchen lisinopril-hydrochlorothiazide (ZESTORETIC) 20-25 MG tablet Take 1  tablet by mouth daily.  . meclizine (ANTIVERT) 12.5 MG tablet Take 1 tablet (12.5 mg total) by mouth 3 (three) times daily as needed for dizziness.  Marland Kitchen omeprazole (PRILOSEC) 40 MG capsule Take 1 capsule (40 mg total) by mouth daily.  . pravastatin (PRAVACHOL) 80 MG tablet Take 1 tablet (80 mg total) by mouth daily.   No facility-administered encounter medications on file as of 09/08/2020.    Past Surgical History:  Procedure Laterality Date  . KNEE ARTHROSCOPY Right   . SHOULDER SURGERY Right     Family History  Problem Relation Age of Onset  . Cancer Mother        pancreatic  . Alzheimer's disease Mother 75  . Diabetes Sister   . Aneurysm Sister        questionable - possible heart attack   . Coronary artery disease Maternal Grandfather   . Alcohol abuse Maternal Grandfather   . GI Disease Maternal Grandfather   . Early death Father   . Alzheimer's disease Paternal Uncle 78  . Aneurysm Paternal Uncle   . 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  Eyes: Negative for pain.  Respiratory: Negative  for shortness of breath.   Cardiovascular: Negative for chest pain, palpitations and leg swelling.  Gastrointestinal: Negative for abdominal pain.  Endocrine: Negative for polydipsia.  Skin: Negative for rash.  Neurological: Negative for dizziness, weakness and headaches.  Hematological: Does not bruise/bleed easily.  All other systems reviewed and are negative.      Objective:   Physical Exam Vitals and nursing note reviewed.  Constitutional:      Appearance: Normal appearance. He is well-developed.  HENT:     Head: Normocephalic.     Nose: Nose normal.  Eyes:     Pupils: Pupils are equal, round, and reactive to light.  Neck:     Thyroid: No thyroid mass or thyromegaly.     Vascular: No carotid  bruit or JVD.     Trachea: Phonation normal.  Cardiovascular:     Rate and Rhythm: Normal rate and regular rhythm.  Pulmonary:     Effort: Pulmonary effort is normal. No respiratory distress.     Breath sounds: Normal breath sounds.  Abdominal:     General: Bowel sounds are normal.     Palpations: Abdomen is soft.     Tenderness: There is no abdominal tenderness.  Musculoskeletal:        General: Normal range of motion.     Cervical back: Normal range of motion and neck supple.  Lymphadenopathy:     Cervical: No cervical adenopathy.  Skin:    General: Skin is warm and dry.  Neurological:     Mental Status: He is alert and oriented to person, place, and time.  Psychiatric:        Behavior: Behavior normal.        Thought Content: Thought content normal.        Judgment: Judgment normal.     BP 140/90   Pulse 69   Temp 98.6 F (37 C) (Temporal)   Resp 20   Ht 5\' 8"  (1.727 m)   Wt 212 lb (96.2 kg)   SpO2 97%   BMI 32.23 kg/m       Assessment & Plan:  David Garza comes in today with chief complaint of Medical Management of Chronic Issues   Diagnosis and orders addressed:  1. Primary hypertension Low sodium diet - lisinopril-hydrochlorothiazide (ZESTORETIC) 20-25 MG tablet; Take 1 tablet by mouth daily.  Dispense: 90 tablet; Refill: 1  2. Pure hypertriglyceridemia Low fat diet - pravastatin (PRAVACHOL) 80 MG tablet; Take 1 tablet (80 mg total) by mouth daily.  Dispense: 90 tablet; Refill: 1  3. Gastroesophageal reflux disease without esophagitis Avoid spicy foods Do not eat 2 hours prior to bedtime - omeprazole (PRILOSEC) 40 MG capsule; Take 1 capsule (40 mg total) by mouth daily.  Dispense: 90 capsule; Refill: 1  4. Neuropathy due to medical condition ([place]) continue neurontin - DULoxetine (CYMBALTA) 60 MG capsule; Take 1 capsule (60 mg total) by mouth daily.  Dispense: 90 capsule; Refill: 1 - gabapentin (NEURONTIN) 100 MG capsule; Take 2 capsules (200  mg total) by mouth 2 (two) times daily.  Dispense: 120 capsule; Refill: 1  5. Idiopathic chronic gout of right foot without tophus Low purine diet  6. Late effect of cerebrovascular accident (CVA) - clopidogrel (PLAVIX) 75 MG tablet; Take 1 tablet (75 mg total) by mouth daily.  Dispense: 90 tablet; Refill: 1  7. BMI 33.0-33.9,adult Discussed diet and exercise for person with BMI >25 Will recheck weight in 3-6 months   Labs pending Health Maintenance reviewed Diet  and exercise encouraged  Follow up plan: 6 months   Mary-Margaret Hassell Done, FNP

## 2020-09-08 NOTE — Patient Instructions (Signed)
Bradley and Daroff's neurology in clinical practice (8th ed., pp. [phone]- 1929). Elsevier."> Goldman-Cecil medicine (26th ed., pp. 2489- [phone]). Elsevier.">  Peripheral Neuropathy Peripheral neuropathy is a type of nerve damage. It affects nerves that carry signals between the spinal cord and the arms, legs, and the rest of the body (peripheral nerves). It does not affect nerves in the spinal cord or brain. In peripheral neuropathy, one nerve or a group of nerves may be damaged. Peripheral neuropathy is a broad category that includes many specific nerve disorders, like diabetic neuropathy, hereditary neuropathy, and carpal tunnel syndrome. What are the causes? This condition may be caused by:  Diabetes. This is the most common cause of peripheral neuropathy.  Nerve injury.  Pressure or stress on a nerve that lasts a long time.  Lack (deficiency) of B vitamins. This can result from alcoholism, poor diet, or a restricted diet.  Infections.  Autoimmune diseases, such as rheumatoid arthritis and systemic lupus erythematosus.  Nerve diseases that are passed from parent to child (inherited).  Some medicines, such as cancer medicines (chemotherapy).  Poisonous (toxic) substances, such as lead and mercury.  Too little blood flowing to the legs.  Kidney disease.  Thyroid disease. In some cases, the cause of this condition is not known. What are the signs or symptoms? Symptoms of this condition depend on which of your nerves is damaged. Common symptoms include:  Loss of feeling (numbness) in the feet, hands, or both.  Tingling in the feet, hands, or both.  Burning pain.  Very sensitive skin.  Weakness.  Not being able to move a part of the body (paralysis).  Muscle twitching.  Clumsiness or poor coordination.  Loss of balance.  Not being able to control your bladder.  Feeling dizzy.  Sexual problems. How is this diagnosed? Diagnosing and finding the cause of peripheral  neuropathy can be difficult. Your health care provider will take your medical history and do a physical exam. A neurological exam will also be done. This involves checking things that are affected by your brain, spinal cord, and nerves (nervous system). For example, your health care provider will check your reflexes, how you move, and what you can feel. You may have other tests, such as:  Blood tests.  Electromyogram (EMG) and nerve conduction tests. These tests check nerve function and how well the nerves are controlling the muscles.  Imaging tests, such as CT scans or MRI to rule out other causes of your symptoms.  Removing a small piece of nerve to be examined in a lab (nerve biopsy).  Removing and examining a small amount of the fluid that surrounds the brain and spinal cord (lumbar puncture). How is this treated? Treatment for this condition may involve:  Treating the underlying cause of the neuropathy, such as diabetes, kidney disease, or vitamin deficiencies.  Stopping medicines that can cause neuropathy, such as chemotherapy.  Medicine to help relieve pain. Medicines may include: ? Prescription or over-the-counter pain medicine. ? Antiseizure medicine. ? Antidepressants. ? Pain-relieving patches that are applied to painful areas of skin.  Surgery to relieve pressure on a nerve or to destroy a nerve that is causing pain.  Physical therapy to help improve movement and balance.  Devices to help you move around (assistive devices). Follow these instructions at home: Medicines  Take over-the-counter and prescription medicines only as told by your health care provider. Do not take any other medicines without first asking your health care provider.  Do not drive or use heavy   machinery while taking prescription pain medicine. Lifestyle  Do not use any products that contain nicotine or tobacco, such as cigarettes and e-cigarettes. Smoking keeps blood from reaching damaged nerves.  If you need help quitting, ask your health care provider.  Avoid or limit alcohol. Too much alcohol can cause a vitamin B deficiency, and vitamin B is needed for healthy nerves.  Eat a healthy diet. This includes: ? Eating foods that are high in fiber, such as fresh fruits and vegetables, whole grains, and beans. ? Limiting foods that are high in fat and processed sugars, such as fried or sweet foods.   General instructions  If you have diabetes, work closely with your health care provider to keep your blood sugar under control.  If you have numbness in your feet: ? Check every day for signs of injury or infection. Watch for redness, warmth, and swelling. ? Wear padded socks and comfortable shoes. These help protect your feet.  Develop a good support system. Living with peripheral neuropathy can be stressful. Consider talking with a mental health specialist or joining a support group.  Use assistive devices and attend physical therapy as told by your health care provider. This may include using a walker or a cane.  Keep all follow-up visits as told by your health care provider. This is important.   Contact a health care provider if:  You have new signs or symptoms of peripheral neuropathy.  You are struggling emotionally from dealing with peripheral neuropathy.  Your pain is not well-controlled. Get help right away if:  You have an injury or infection that is not healing normally.  You develop new weakness in an arm or leg.  You have fallen or do so frequently. Summary  Peripheral neuropathy is when the nerves in the arms, or legs are damaged, resulting in numbness, weakness, or pain.  There are many causes of peripheral neuropathy, including diabetes, pinched nerves, vitamin deficiencies, autoimmune disease, and hereditary conditions.  Diagnosing and finding the cause of peripheral neuropathy can be difficult. Your health care provider will take your medical history, do a  physical exam, and do tests, including blood tests and nerve function tests.  Treatment involves treating the underlying cause of the neuropathy and taking medicines to help control pain. Physical therapy and assistive devices may also help. This information is not intended to replace advice given to you by your health care provider. Make sure you discuss any questions you have with your health care provider. Document Revised: 01/28/2020 Document Reviewed: 01/28/2020 Elsevier Patient Education  2021 Elsevier Inc.  

## 2020-10-27 ENCOUNTER — Ambulatory Visit (INDEPENDENT_AMBULATORY_CARE_PROVIDER_SITE_OTHER): Payer: Medicare HMO

## 2020-10-27 VITALS — Ht 68.0 in | Wt 212.0 lb

## 2020-10-27 DIAGNOSIS — Z0000 Encounter for general adult medical examination without abnormal findings: Secondary | ICD-10-CM

## 2020-10-27 NOTE — Patient Instructions (Signed)
David Garza , Thank you for taking time to come for your Medicare Wellness Visit. I appreciate your ongoing commitment to your health goals. Please review the following plan we discussed and let me know if I can assist you in the future.   Screening recommendations/referrals: Colonoscopy: Done 05/05/2013 - Repeat in 10 years Recommended yearly ophthalmology/optometry visit for glaucoma screening and checkup Recommended yearly dental visit for hygiene and checkup  Vaccinations: Influenza vaccine: Done 02/06/2020 - Repeat annually Pneumococcal vaccine: done 03/14/2017 & 07/05/2018 Tdap vaccine: Done 09/17/2012 - Repeat in 10 years Shingles vaccine: Zostavax done 2016; due for Shingrix - discussed. Please contact your pharmacy for coverage information.    Covid-19: Done 07/01/2019 & 08/01/2019 - due for booster  Advanced directives: Advance directive discussed with you today. I have provided a copy for you to complete at home and have notarized. Once this is complete please bring a copy in to our office so we can scan it into your chart.  Conditions/risks identified: Aim for 30 minutes of exercise or brisk walking each day, drink 6-8 glasses of water and eat lots of fruits and vegetables - less carbs, fats and sugars.  Next appointment: Follow up in one year for your annual wellness visit.   Preventive Care 50 Years and Older, Male  Preventive care refers to lifestyle choices and visits with your health care provider that can promote health and wellness. What does preventive care include? A yearly physical exam. This is also called an annual well check. Dental exams once or twice a year. Routine eye exams. Ask your health care provider how often you should have your eyes checked. Personal lifestyle choices, including: Daily care of your teeth and gums. Regular physical activity. Eating a healthy diet. Avoiding tobacco and drug use. Limiting alcohol use. Practicing safe sex. Taking low doses of  aspirin every day. Taking vitamin and mineral supplements as recommended by your health care provider. What happens during an annual well check? The services and screenings done by your health care provider during your annual well check will depend on your age, overall health, lifestyle risk factors, and family history of disease. Counseling  Your health care provider may ask you questions about your: Alcohol use. Tobacco use. Drug use. Emotional well-being. Home and relationship well-being. Sexual activity. Eating habits. History of falls. Memory and ability to understand (cognition). Work and work Statistician. Screening  You may have the following tests or measurements: Height, weight, and BMI. Blood pressure. Lipid and cholesterol levels. These may be checked every 5 years, or more frequently if you are over 36 years old. Skin check. Lung cancer screening. You may have this screening every year starting at age 60 if you have a 30-pack-year history of smoking and currently smoke or have quit within the past 15 years. Fecal occult blood test (FOBT) of the stool. You may have this test every year starting at age 68. Flexible sigmoidoscopy or colonoscopy. You may have a sigmoidoscopy every 5 years or a colonoscopy every 10 years starting at age 55. Prostate cancer screening. Recommendations will vary depending on your family history and other risks. Hepatitis C blood test. Hepatitis B blood test. Sexually transmitted disease (STD) testing. Diabetes screening. This is done by checking your blood sugar (glucose) after you have not eaten for a while (fasting). You may have this done every 1-3 years. Abdominal aortic aneurysm (AAA) screening. You may need this if you are a current or former smoker. Osteoporosis. You may be screened starting  at age 57 if you are at high risk. Talk with your health care provider about your test results, treatment options, and if necessary, the need for more  tests. Vaccines  Your health care provider may recommend certain vaccines, such as: Influenza vaccine. This is recommended every year. Tetanus, diphtheria, and acellular pertussis (Tdap, Td) vaccine. You may need a Td booster every 10 years. Zoster vaccine. You may need this after age 35. Pneumococcal 13-valent conjugate (PCV13) vaccine. One dose is recommended after age 35. Pneumococcal polysaccharide (PPSV23) vaccine. One dose is recommended after age 67. Talk to your health care provider about which screenings and vaccines you need and how often you need them. This information is not intended to replace advice given to you by your health care provider. Make sure you discuss any questions you have with your health care provider. Document Released: 05/15/2015 Document Revised: 01/06/2016 Document Reviewed: 02/17/2015 Elsevier Interactive Patient Education  2017 [place] Prevention in the Home Falls can cause injuries. They can happen to people of all ages. There are many things you can do to make your home safe and to help prevent falls. What can I do on the outside of my home? Regularly fix the edges of walkways and driveways and fix any cracks. Remove anything that might make you trip as you walk through a door, such as a raised step or threshold. Trim any bushes or trees on the path to your home. Use bright outdoor lighting. Clear any walking paths of anything that might make someone trip, such as rocks or tools. Regularly check to see if handrails are loose or broken. Make sure that both sides of any steps have handrails. Any raised decks and porches should have guardrails on the edges. Have any leaves, snow, or ice cleared regularly. Use sand or salt on walking paths during winter. Clean up any spills in your garage right away. This includes oil or grease spills. What can I do in the bathroom? Use night lights. Install grab bars by the toilet and in the tub and shower.  Do not use towel bars as grab bars. Use non-skid mats or decals in the tub or shower. If you need to sit down in the shower, use a plastic, non-slip stool. Keep the floor dry. Clean up any water that spills on the floor as soon as it happens. Remove soap buildup in the tub or shower regularly. Attach bath mats securely with double-sided non-slip rug tape. Do not have throw rugs and other things on the floor that can make you trip. What can I do in the bedroom? Use night lights. Make sure that you have a light by your bed that is easy to reach. Do not use any sheets or blankets that are too big for your bed. They should not hang down onto the floor. Have a firm chair that has side arms. You can use this for support while you get dressed. Do not have throw rugs and other things on the floor that can make you trip. What can I do in the kitchen? Clean up any spills right away. Avoid walking on wet floors. Keep items that you use a lot in easy-to-reach places. If you need to reach something above you, use a strong step stool that has a grab bar. Keep electrical cords out of the way. Do not use floor polish or wax that makes floors slippery. If you must use wax, use non-skid floor wax. Do not have throw rugs  and other things on the floor that can make you trip. What can I do with my stairs? Do not leave any items on the stairs. Make sure that there are handrails on both sides of the stairs and use them. Fix handrails that are broken or loose. Make sure that handrails are as long as the stairways. Check any carpeting to make sure that it is firmly attached to the stairs. Fix any carpet that is loose or worn. Avoid having throw rugs at the top or bottom of the stairs. If you do have throw rugs, attach them to the floor with carpet tape. Make sure that you have a light switch at the top of the stairs and the bottom of the stairs. If you do not have them, ask someone to add them for you. What else  can I do to help prevent falls? Wear shoes that: Do not have high heels. Have rubber bottoms. Are comfortable and fit you well. Are closed at the toe. Do not wear sandals. If you use a stepladder: Make sure that it is fully opened. Do not climb a closed stepladder. Make sure that both sides of the stepladder are locked into place. Ask someone to hold it for you, if possible. Clearly mark and make sure that you can see: Any grab bars or handrails. First and last steps. Where the edge of each step is. Use tools that help you move around (mobility aids) if they are needed. These include: Canes. Walkers. Scooters. Crutches. Turn on the lights when you go into a dark area. Replace any light bulbs as soon as they burn out. Set up your furniture so you have a clear path. Avoid moving your furniture around. If any of your floors are uneven, fix them. If there are any pets around you, be aware of where they are. Review your medicines with your doctor. Some medicines can make you feel dizzy. This can increase your chance of falling. Ask your doctor what other things that you can do to help prevent falls. This information is not intended to replace advice given to you by your health care provider. Make sure you discuss any questions you have with your health care provider. Document Released: 02/12/2009 Document Revised: 09/24/2015 Document Reviewed: 05/23/2014 Elsevier Interactive Patient Education  2017 [employer].  [email or web address]">  DASH Eating Plan DASH stands for Dietary Approaches to Stop Hypertension. The DASH eating plan is a healthy eating plan that has been shown to: Reduce high blood pressure (hypertension). Reduce your risk for type 2 diabetes, heart disease, and stroke. Help with weight loss. What are tips for following this plan? Reading food labels Check food labels for the amount of salt (sodium) per serving. Choose foods  with less than 5 percent of the Daily Value of sodium. Generally, foods with less than 300 milligrams (mg) of sodium per serving fit into this eating plan. To find whole grains, look for the word "whole" as the first word in the ingredient list. Shopping Buy products labeled as "low-sodium" or "no salt added." Buy fresh foods. Avoid canned foods and pre-made or frozen meals. Cooking Avoid adding salt when cooking. Use salt-free seasonings or herbs instead of table salt or sea salt. Check with your health care provider or pharmacist before using salt substitutes. Do not fry foods. Cook foods using healthy methods such as baking, boiling, grilling, roasting, and broiling instead. Cook with heart-healthy oils, such as olive, canola, avocado, soybean, or sunflower oil. Meal planning  Eat a  balanced diet that includes: 4 or more servings of fruits and 4 or more servings of vegetables each day. Try to fill one-half of your plate with fruits and vegetables. 6-8 servings of whole grains each day. Less than 6 oz (170 g) of lean meat, poultry, or fish each day. A 3-oz (85-g) serving of meat is about the same size as a deck of cards. One egg equals 1 oz (28 g). 2-3 servings of low-fat dairy each day. One serving is 1 cup (237 mL). 1 serving of nuts, seeds, or beans 5 times each week. 2-3 servings of heart-healthy fats. Healthy fats called omega-3 fatty acids are found in foods such as walnuts, flaxseeds, fortified milks, and eggs. These fats are also found in cold-water fish, such as sardines, salmon, and mackerel. Limit how much you eat of: Canned or prepackaged foods. Food that is high in trans fat, such as some fried foods. Food that is high in saturated fat, such as fatty meat. Desserts and other sweets, sugary drinks, and other foods with added sugar. Full-fat dairy products. Do not salt foods before eating. Do not eat more than 4 egg yolks a week. Try to eat at least 2 vegetarian meals a  week. Eat more home-cooked food and less restaurant, buffet, and fast food.  Lifestyle When eating at a restaurant, ask that your food be prepared with less salt or no salt, if possible. If you drink alcohol: Limit how much you use to: 0-1 drink a day for women who are not pregnant. 0-2 drinks a day for men. Be aware of how much alcohol is in your drink. In the [place] one drink equals one 12 oz bottle of beer (355 mL), one 5 oz glass of wine (148 mL), or one 1 oz glass of hard liquor (44 mL). General information Avoid eating more than 2,300 mg of salt a day. If you have hypertension, you may need to reduce your sodium intake to 1,500 mg a day. Work with your health care provider to maintain a healthy body weight or to lose weight. Ask what an ideal weight is for you. Get at least 30 minutes of exercise that causes your heart to beat faster (aerobic exercise) most days of the week. Activities may include walking, swimming, or biking. Work with your health care provider or dietitian to adjust your eating plan to your individual calorie needs. What foods should I eat? Fruits All fresh, dried, or frozen fruit. Canned fruit in natural juice (without addedsugar). Vegetables Fresh or frozen vegetables (raw, steamed, roasted, or grilled). Low-sodium or reduced-sodium tomato and vegetable juice. Low-sodium or reduced-sodium tomatosauce and tomato paste. Low-sodium or reduced-sodium canned vegetables. Grains Whole-grain or whole-wheat bread. Whole-grain or whole-wheat pasta. Brown rice. [place]. Bulgur. Whole-grain and low-sodium cereals. Pita bread.Low-fat, low-sodium crackers. Whole-wheat flour tortillas. Meats and other proteins Skinless chicken or [place]. Ground chicken or [place]. Pork with fat trimmed off. Fish and seafood. Egg whites. Dried beans, peas, or lentils. Unsalted nuts, nut butters, and seeds. Unsalted canned beans. Lean cuts of beef with fat trimmed off. Low-sodium, lean  precooked or cured meat, such as sausages or meatloaves. Dairy Low-fat (1%) or fat-free (skim) milk. Reduced-fat, low-fat, or fat-free cheeses. Nonfat, low-sodium ricotta or cottage cheese. Low-fat or nonfatyogurt. Low-fat, low-sodium cheese. Fats and oils Soft margarine without trans fats. Vegetable oil. Reduced-fat, low-fat, or light mayonnaise and salad dressings (reduced-sodium). Canola, safflower, olive, avocado, soybean, andsunflower oils. Avocado. Seasonings and condiments Herbs. Spices. Seasoning mixes without salt. Other  foods Unsalted popcorn and pretzels. Fat-free sweets. The items listed above may not be a complete list of foods and beverages you can eat. Contact a dietitian for more information. What foods should I avoid? Fruits Canned fruit in a light or heavy syrup. Fried fruit. Fruit in cream or buttersauce. Vegetables Creamed or fried vegetables. Vegetables in a cheese sauce. Regular canned vegetables (not low-sodium or reduced-sodium). Regular canned tomato sauce and paste (not low-sodium or reduced-sodium). Regular tomato and vegetable juice(not low-sodium or reduced-sodium). Angie Fava. Olives. Grains Baked goods made with fat, such as croissants, muffins, or some breads. Drypasta or rice meal packs. Meats and other proteins Fatty cuts of meat. Ribs. Fried meat. Berniece Salines. Bologna, salami, and other precooked or cured meats, such as sausages or meat loaves. Fat from the back of a pig (fatback). Bratwurst. Salted nuts and seeds. Canned beans with added salt. Canned orsmoked fish. Whole eggs or egg yolks. Chicken or [place] with skin. Dairy Whole or 2% milk, cream, and half-and-half. Whole or full-fat cream cheese. Whole-fat or sweetened yogurt. Full-fat cheese. Nondairy creamers. Whippedtoppings. Processed cheese and cheese spreads. Fats and oils Butter. Stick margarine. Lard. Shortening. Ghee. Bacon fat. Tropical oils, suchas coconut, palm kernel, or palm oil. Seasonings and  condiments Onion salt, garlic salt, seasoned salt, table salt, and sea salt. Worcestershire sauce. Tartar sauce. Barbecue sauce. Teriyaki sauce. Soy sauce, including reduced-sodium. Steak sauce. Canned and packaged gravies. Fish sauce. Oyster sauce. Cocktail sauce. Store-bought horseradish. Ketchup. Mustard. Meat flavorings and tenderizers. Bouillon cubes. Hot sauces. Pre-made or packaged marinades. Pre-made or packaged taco seasonings. Relishes. Regular saladdressings. Other foods Salted popcorn and pretzels. The items listed above may not be a complete list of foods and beverages you should avoid. Contact a dietitian for more information. Where to find more information National Heart, Lung, and Blood Institute: https://wilson-eaton.com/ American Heart Association: www.heart.org Academy of Nutrition and Dietetics: www.eatright.[place]: www.kidney.org Summary The DASH eating plan is a healthy eating plan that has been shown to reduce high blood pressure (hypertension). It may also reduce your risk for type 2 diabetes, heart disease, and stroke. When on the DASH eating plan, aim to eat more fresh fruits and vegetables, whole grains, lean proteins, low-fat dairy, and heart-healthy fats. With the DASH eating plan, you should limit salt (sodium) intake to 2,300 mg a day. If you have hypertension, you may need to reduce your sodium intake to 1,500 mg a day. Work with your health care provider or dietitian to adjust your eating plan to your individual calorie needs. This information is not intended to replace advice given to you by your health care provider. Make sure you discuss any questions you have with your healthcare provider. Document Revised: 03/22/2019 Document Reviewed: 03/22/2019 Elsevier Patient Education  2022 [employer].

## 2020-10-27 NOTE — Progress Notes (Signed)
Subjective:   David Garza is a 70 y.o. male who presents for Medicare Annual/Subsequent preventive examination.  Virtual Visit via Telephone Note  I connected with  David Garza on 10/27/20 at  8:15 AM EDT by telephone and verified that I am speaking with the correct person using two identifiers.  Location: Patient: Home Provider: [facility] Persons participating in the virtual visit: patient/Nurse Health Advisor   I discussed the limitations, risks, security and privacy concerns of performing an evaluation and management service by telephone and the availability of in person appointments. The patient expressed understanding and agreed to proceed.  Interactive audio and video telecommunications were attempted between this nurse and patient, however failed, due to patient having technical difficulties OR patient did not have access to video capability.  We continued and completed visit with audio only.  Some vital signs may be absent or patient reported.   David Glad E Maylea Soria, LPN   Review of Systems     Cardiac Risk Factors include: advanced age (>58men, >40 women);hypertension;male gender;obesity (BMI >30kg/m2);dyslipidemia     Objective:    Today's Vitals   10/27/20 0817  Weight: 212 lb (96.2 kg)  Height: 5\' 8"  (1.727 m)   Body mass index is 32.23 kg/m.  Advanced Directives 10/27/2020 10/25/2019 09/19/2018 05/16/2014  Does Patient Have a Medical Advance Directive? No No No No  Would patient like information on creating a medical advance directive? Yes (MAU/Ambulatory/Procedural Areas - Information given) No - Patient declined No - Patient declined Yes - Educational materials given    Current Medications (verified) Outpatient Encounter Medications as of 10/27/2020  Medication Sig   clopidogrel (PLAVIX) 75 MG tablet Take 1 tablet (75 mg total) by mouth daily.   colchicine 0.6 MG tablet Take 1.2 mg (2 tablets) by mouth x1, then 0.6 mg (1 tablet) 1 hour later.   DULoxetine  (CYMBALTA) 60 MG capsule Take 1 capsule (60 mg total) by mouth daily.   fish oil-omega-3 fatty acids 1000 MG capsule Take 2 g by mouth daily.   gabapentin (NEURONTIN) 100 MG capsule Take 2 capsules (200 mg total) by mouth 2 (two) times daily.   lisinopril-hydrochlorothiazide (ZESTORETIC) 20-25 MG tablet Take 1 tablet by mouth daily.   meclizine (ANTIVERT) 12.5 MG tablet Take 1 tablet (12.5 mg total) by mouth 3 (three) times daily as needed for dizziness.   omeprazole (PRILOSEC) 40 MG capsule Take 1 capsule (40 mg total) by mouth daily.   pravastatin (PRAVACHOL) 80 MG tablet Take 1 tablet (80 mg total) by mouth daily.   No facility-administered encounter medications on file as of 10/27/2020.    Allergies (verified) Meloxicam and Nsaids   History: Past Medical History:  Diagnosis Date   Hyperlipidemia    Hypertension    Metabolic syndrome    Stroke ([place]) 06/2016   Past Surgical History:  Procedure Laterality Date   KNEE ARTHROSCOPY Right    SHOULDER SURGERY Right    Family History  Problem Relation Age of Onset   Cancer Mother        pancreatic   Alzheimer's disease Mother 75   Diabetes Sister    Aneurysm Sister        questionable - possible heart attack    Coronary artery disease Maternal Grandfather    Alcohol abuse Maternal Grandfather    GI Disease Maternal Grandfather    Early death Father    Alzheimer's disease Paternal Uncle 56   Aneurysm Paternal Uncle    Cancer Sister  male   Social History   Socioeconomic History   Marital status: Married    Spouse name: [name]    Number of children: 1   Years of education: 12   Highest education level: High school graduate  Occupational History   Occupation: Retired    Comment: continues to farm   Tobacco Use   Smoking status: Former    Pack years: 0.00    Types: Cigarettes    Quit date: 04/01/1998    Years since quitting: 22.5   Smokeless tobacco: Never  Vaping Use   Vaping Use: Never used  Substance and  Sexual Activity   Alcohol use: No   Drug use: No   Sexual activity: Not on file  Other Topics Concern   Not on file  Social History Narrative   Lives at home with Wife [name] and daughter [name]    He and his wife go to [place] and the [employer] 3x per week and 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   Social Determinants of Radio broadcast assistant Strain: Low Risk    Difficulty of Paying Living Expenses: Not hard at all  Food Insecurity: No Food Insecurity   Worried About Charity fundraiser in the Last Year: Never true   Arboriculturist in the Last Year: Never true  Transportation Needs: No Transportation Needs   Lack of Transportation (Medical): No   Lack of Transportation (Non-Medical): No  Physical Activity: Sufficiently Active   Days of Exercise per Week: 7 days   Minutes of Exercise per Session: 60 min  Stress: No Stress Concern Present   Feeling of Stress : Not at all  Social Connections: Socially Integrated   Frequency of Communication with Friends and Family: More than three times a week   Frequency of Social Gatherings with Friends and Family: More than three times a week   Attends Religious Services: More than 4 times per year   Active Member of [employer] or Organizations: Yes   Attends Music therapist: More than 4 times per year   Marital Status: Married    Tobacco Counseling Counseling given: Not Answered   Clinical Intake:  Pre-visit preparation completed: Yes  Pain : No/denies pain     BMI - recorded: 32.23 Nutritional Status: BMI > 30  Obese Nutritional Risks: None Diabetes: No  How often do you need to have someone help you when you read instructions, pamphlets, or other written materials from your doctor or pharmacy?: 1 - Never  Diabetic? No  Interpreter Needed?: No  Information entered by :: Melesa Lecy, LPN   Activities of Daily Living In your present state of health, do you have any difficulty performing the following  activities: 10/27/2020  Hearing? N  Vision? N  Difficulty concentrating or making decisions? N  Walking or climbing stairs? N  Dressing or bathing? N  Doing errands, shopping? N  Preparing Food and eating ? N  Using the Toilet? N  In the past six months, have you accidently leaked urine? N  Do you have problems with loss of bowel control? N  Managing your Medications? N  Managing your Finances? N  Housekeeping or managing your Housekeeping? N  Some recent data might be hidden    Patient Care Team: [name], FNP as PCP - General (Nurse Practitioner) Burke Keels, MD (Surgery) [place], Tremond Shimabukuro, MD as Consulting Physician (Dermatology)  Indicate any recent Medical Services you may have received from other than [facility]  providers in the past year (date may be approximate).     Assessment:   This is a routine wellness examination for David Garza.  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       Prevent falls   On track    [name] active         Depression Screen PHQ 2/9 Scores 10/27/2020 09/08/2020 06/11/2020 03/05/2020 11/22/2019 10/25/2019 08/09/2019  PHQ - 2 Score 0 0 0 0 0 0 0    Fall  Risk Fall Risk  10/27/2020 09/08/2020 06/11/2020 03/05/2020 11/22/2019  Falls in the past year? 0 0 0 0 0  Number falls in past yr: 0 - - - 0  Injury with Fall? 0 - - - 0  Risk for fall due to : Medication side effect;Other (Comment) - - - No Fall Risks  Risk for fall due to: Comment neuropathy - - - -  Follow up Falls prevention discussed - - - Falls evaluation completed    FALL RISK PREVENTION PERTAINING TO THE HOME:  Any stairs in or around the home? Yes  If so, are there any without handrails? No  Home free of loose throw rugs in walkways, pet beds, electrical cords, etc? Yes  Adequate lighting in your home to reduce risk of falls? Yes   ASSISTIVE DEVICES UTILIZED TO PREVENT FALLS:  Life alert? No  Use of a cane, walker or w/c? No  Grab bars in the bathroom? No  Shower chair or bench in shower? Yes  Elevated toilet seat or a handicapped toilet? No   TIMED UP AND GO:  Was the test performed? No . Telephonic visit.  Cognitive Function: Normal cognitive status assessed by direct observation by this Nurse Health Advisor. No abnormalities found.   MMSE - Mini Mental State Exam 09/13/2017  Orientation to time 5  Orientation to Place 5  Registration 3  Attention/ Calculation 5  Recall 2  Language- name 2 objects 2  Language- repeat 1  Language- follow 3 step command 3  Language- read & follow direction 1  Write a sentence 1  Copy design 1  Total score 29     6CIT Screen 10/25/2019 09/19/2018  What Year? 0 points 0 points  What month? 0 points 0 points  What time? 0 points 0 points  Count back from 20 0 points 0 points  Months in reverse 0 points 0 points  Repeat phrase 0 points 0 points  Total Score 0 0    Immunizations Immunization History  Administered Date(s) Administered   Fluad Quad(high Dose 65+) 01/31/2019   Influenza, High Dose Seasonal PF 01/24/2018   Influenza,inj,Quad PF,6+ Mos 03/19/2013, 02/03/2014, 01/29/2015, 02/03/2017   Influenza-Unspecified  02/11/2016, 02/06/2020  Moderna Sars-Covid-2 Vaccination 07/01/2019, 08/01/2019   Pneumococcal Conjugate-13 03/14/2017   Pneumococcal Polysaccharide-23 07/05/2018   Tdap 09/17/2012   Zoster, Live 05/16/2014    TDAP status: Up to date  Flu Vaccine status: Up to date  Pneumococcal vaccine status: Up to date  Covid-19 vaccine status: Completed vaccines  Qualifies for Shingles Vaccine? Yes   Zostavax completed Yes   Shingrix Completed?: No.    Education has been provided regarding the importance of this vaccine. Patient has been advised to call insurance company to determine out of pocket expense if they have not yet received this vaccine. Advised may also receive vaccine at local pharmacy or Health Dept. Verbalized acceptance and understanding.  Screening Tests Health Maintenance  Topic Date Due   Zoster Vaccines- Shingrix (1 of 2) Never done   COVID-19 Vaccine (3 - Booster for Moderna series) 01/01/2020   INFLUENZA VACCINE  11/30/2020   TETANUS/TDAP  09/18/2022   COLONOSCOPY (Pts 45-20yrs Insurance coverage will need to be confirmed)  05/06/2023   Hepatitis C Screening  Completed   PNA vac Low Risk Adult  Completed   HPV VACCINES  Aged Out    Health Maintenance  Health Maintenance Due  Topic Date Due   Zoster Vaccines- Shingrix (1 of 2) Never done   COVID-19 Vaccine (3 - Booster for Moderna series) 01/01/2020    Colorectal cancer screening: Type of screening: Colonoscopy. Completed 05/05/2013. Repeat every 10 years  Lung Cancer Screening: (Low Dose CT Chest recommended if Age 108-80 years, 30 pack-year currently smoking OR have quit w/in 15years.) does not qualify.   Additional Screening:  Hepatitis C Screening: does qualify; Completed 03/04/2015  Vision Screening: Recommended annual ophthalmology exams for early detection of glaucoma and other disorders of the eye. Is the patient up to date with their annual eye exam?  Yes  Who is the provider or what is the name of the  office in which the patient attends annual eye exams? [place] If pt is not established with a provider, would they like to be referred to a provider to establish care? No .   Dental Screening: Recommended annual dental exams for proper oral hygiene  Community Resource Referral / Chronic Care Management: CRR required this visit?  No   CCM required this visit?  No      Plan:     I have personally reviewed and noted the following in the patient's chart:   Medical and social history Use of alcohol, tobacco or illicit drugs  Current medications and supplements including opioid prescriptions. Patient is not currently taking opioid prescriptions. Functional ability and status Nutritional status Physical activity Advanced directives List of other physicians Hospitalizations, surgeries, and ER visits in previous 12 months Vitals Screenings to include cognitive, depression, and falls Referrals and appointments  In addition, I have reviewed and discussed with patient certain preventive protocols, quality metrics, and best practice recommendations. A written personalized care plan for preventive services as well as general preventive health recommendations were provided to patient.     Sandrea Hammond, LPN   7/98/9211   Nurse Notes: None

## 2020-11-30 DIAGNOSIS — L918 Other hypertrophic disorders of the skin: Secondary | ICD-10-CM | POA: Diagnosis not present

## 2020-11-30 DIAGNOSIS — D2261 Melanocytic nevi of right upper limb, including shoulder: Secondary | ICD-10-CM | POA: Diagnosis not present

## 2020-11-30 DIAGNOSIS — D485 Neoplasm of uncertain behavior of skin: Secondary | ICD-10-CM | POA: Diagnosis not present

## 2020-11-30 DIAGNOSIS — Z85828 Personal history of other malignant neoplasm of skin: Secondary | ICD-10-CM | POA: Diagnosis not present

## 2020-11-30 DIAGNOSIS — L814 Other melanin hyperpigmentation: Secondary | ICD-10-CM | POA: Diagnosis not present

## 2020-11-30 DIAGNOSIS — D2262 Melanocytic nevi of left upper limb, including shoulder: Secondary | ICD-10-CM | POA: Diagnosis not present

## 2020-11-30 DIAGNOSIS — L821 Other seborrheic keratosis: Secondary | ICD-10-CM | POA: Diagnosis not present

## 2020-12-17 ENCOUNTER — Other Ambulatory Visit: Payer: Self-pay | Admitting: Nurse Practitioner

## 2020-12-17 MED ORDER — COLCHICINE 0.6 MG PO TABS: ORAL_TABLET | ORAL | 0 refills | Status: DC

## 2021-03-08 ENCOUNTER — Other Ambulatory Visit: Payer: Self-pay

## 2021-03-08 ENCOUNTER — Other Ambulatory Visit: Payer: Medicare HMO

## 2021-03-08 DIAGNOSIS — E781 Pure hyperglyceridemia: Secondary | ICD-10-CM

## 2021-03-08 DIAGNOSIS — I10 Essential (primary) hypertension: Secondary | ICD-10-CM | POA: Diagnosis not present

## 2021-03-08 DIAGNOSIS — R739 Hyperglycemia, unspecified: Secondary | ICD-10-CM | POA: Diagnosis not present

## 2021-03-08 LAB — CBC WITH DIFFERENTIAL/PLATELET
Basophils Absolute: 0.1 10*3/uL (ref 0.0–0.2)
Basos: 1 %
EOS (ABSOLUTE): 0.3 10*3/uL (ref 0.0–0.4)
Eos: 4 %
Hematocrit: 46.6 % (ref 37.5–51.0)
Hemoglobin: 16.4 g/dL (ref 13.0–17.7)
Immature Grans (Abs): 0 10*3/uL (ref 0.0–0.1)
Immature Granulocytes: 0 %
Lymphocytes Absolute: 1.4 10*3/uL (ref 0.7–3.1)
Lymphs: 20 %
MCH: 30.7 pg (ref 26.6–33.0)
MCHC: 35.2 g/dL (ref 31.5–35.7)
MCV: 87 fL (ref 79–97)
Monocytes Absolute: 0.5 10*3/uL (ref 0.1–0.9)
Monocytes: 7 %
Neutrophils Absolute: 4.7 10*3/uL (ref 1.4–7.0)
Neutrophils: 68 %
Platelets: 224 10*3/uL (ref 150–450)
RBC: 5.35 x10E6/uL (ref 4.14–5.80)
RDW: 12.6 % (ref 11.6–15.4)
WBC: 7.1 10*3/uL (ref 3.4–10.8)

## 2021-03-08 LAB — CMP14+EGFR
ALT: 18 IU/L (ref 0–44)
AST: 12 IU/L (ref 0–40)
Albumin/Globulin Ratio: 2.6 — ABNORMAL HIGH (ref 1.2–2.2)
Albumin: 4.9 g/dL — ABNORMAL HIGH (ref 3.8–4.8)
Alkaline Phosphatase: 47 IU/L (ref 44–121)
BUN/Creatinine Ratio: 7 — ABNORMAL LOW (ref 10–24)
BUN: 8 mg/dL (ref 8–27)
Bilirubin Total: 0.6 mg/dL (ref 0.0–1.2)
CO2: 26 mmol/L (ref 20–29)
Calcium: 10 mg/dL (ref 8.6–10.2)
Chloride: 101 mmol/L (ref 96–106)
Creatinine, Ser: 1.11 mg/dL (ref 0.76–1.27)
Globulin, Total: 1.9 g/dL (ref 1.5–4.5)
Glucose: 117 mg/dL — ABNORMAL HIGH (ref 70–99)
Potassium: 4.4 mmol/L (ref 3.5–5.2)
Sodium: 140 mmol/L (ref 134–144)
Total Protein: 6.8 g/dL (ref 6.0–8.5)
eGFR: 72 mL/min/{1.73_m2} (ref >59)

## 2021-03-08 LAB — LIPID PANEL
Chol/HDL Ratio: 4.7 ratio (ref 0.0–5.0)
Cholesterol, Total: 159 mg/dL (ref 100–199)
HDL: 34 mg/dL — ABNORMAL LOW (ref >39)
LDL Chol Calc (NIH): 94 mg/dL (ref 0–99)
Triglycerides: 177 mg/dL — ABNORMAL HIGH (ref 0–149)
VLDL Cholesterol Cal: 31 mg/dL (ref 5–40)

## 2021-03-08 LAB — BAYER DCA HB A1C WAIVED: HB A1C (BAYER DCA - WAIVED): 5.6 % (ref 4.8–5.6)

## 2021-03-12 ENCOUNTER — Encounter: Payer: Self-pay | Admitting: Nurse Practitioner

## 2021-03-12 ENCOUNTER — Ambulatory Visit (INDEPENDENT_AMBULATORY_CARE_PROVIDER_SITE_OTHER): Payer: Medicare HMO | Admitting: Nurse Practitioner

## 2021-03-12 ENCOUNTER — Other Ambulatory Visit: Payer: Self-pay

## 2021-03-12 VITALS — BP 138/81 | HR 72 | Temp 98.2°F | Resp 20 | Ht 68.0 in | Wt 211.0 lb

## 2021-03-12 DIAGNOSIS — E781 Pure hyperglyceridemia: Secondary | ICD-10-CM

## 2021-03-12 DIAGNOSIS — G63 Polyneuropathy in diseases classified elsewhere: Secondary | ICD-10-CM | POA: Diagnosis not present

## 2021-03-12 DIAGNOSIS — I6930 Unspecified sequelae of cerebral infarction: Secondary | ICD-10-CM | POA: Diagnosis not present

## 2021-03-12 DIAGNOSIS — M1A0710 Idiopathic chronic gout, right ankle and foot, without tophus (tophi): Secondary | ICD-10-CM

## 2021-03-12 DIAGNOSIS — I10 Essential (primary) hypertension: Secondary | ICD-10-CM | POA: Diagnosis not present

## 2021-03-12 DIAGNOSIS — K219 Gastro-esophageal reflux disease without esophagitis: Secondary | ICD-10-CM | POA: Diagnosis not present

## 2021-03-12 DIAGNOSIS — Z6833 Body mass index (BMI) 33.0-33.9, adult: Secondary | ICD-10-CM | POA: Diagnosis not present

## 2021-03-12 MED ORDER — PRAVASTATIN SODIUM 80 MG PO TABS
80.0000 mg | ORAL_TABLET | Freq: Every day | ORAL | 1 refills | Status: DC
Start: 2021-03-12 — End: 2021-09-14

## 2021-03-12 MED ORDER — OMEPRAZOLE 40 MG PO CPDR: 40.0000 mg | DELAYED_RELEASE_CAPSULE | Freq: Every day | ORAL | 1 refills | Status: DC

## 2021-03-12 MED ORDER — COLCHICINE 0.6 MG PO TABS: ORAL_TABLET | ORAL | 3 refills | Status: DC

## 2021-03-12 MED ORDER — GABAPENTIN 100 MG PO CAPS: 200.0000 mg | ORAL_CAPSULE | Freq: Two times a day (BID) | ORAL | 1 refills | Status: DC

## 2021-03-12 MED ORDER — CLOPIDOGREL BISULFATE 75 MG PO TABS: 75.0000 mg | ORAL_TABLET | Freq: Every day | ORAL | 1 refills | Status: DC

## 2021-03-12 MED ORDER — LISINOPRIL-HYDROCHLOROTHIAZIDE 20-25 MG PO TABS: 1.0000 | ORAL_TABLET | Freq: Every day | ORAL | 1 refills | Status: DC

## 2021-03-12 MED ORDER — DULOXETINE HCL 60 MG PO CPEP: 60.0000 mg | ORAL_CAPSULE | Freq: Every day | ORAL | 1 refills | Status: DC

## 2021-03-12 NOTE — Progress Notes (Signed)
Subjective:    Patient ID: David Garza, male    DOB: January 03, 1951, 70 y.o.   MRN: [id number]  Chief Complaint: medical management of chronic issues     HPI:  1. Primary hypertension Np c/o chest pain, sob or headache. He does occasionally check his blood pressure at home. BP Readings from Last 3 Encounters:  03/12/21 138/81  09/08/20 140/90  06/11/20 (!) 151/95      2. 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 Lab Results  Component Value Date   CHOL 159 03/08/2021   HDL 34 (L) 03/08/2021   LDLCALC 94 03/08/2021   TRIG 177 (H) 03/08/2021   CHOLHDL 4.7 03/08/2021     3. Late effect of cerebrovascular accident (CVA) Only effect he has is his right hand is tingly and slight numb.  4. Gastroesophageal reflux disease without esophagitis Is on omeprazole daily and is doing well  5. Neuropathy due to medical condition ([place]) Mainly in right hand secondary to stroke.  6. Idiopathic chronic gout of right foot without tophus Has gout flare p several weeks ago.   7. BMI 33.0-33.9,adult No recent weight changes  Wt Readings from Last 3 Encounters:  03/12/21 211 lb (95.7 kg)  10/27/20 212 lb (96.2 kg)  09/08/20 212 lb (96.2 kg)   BMI Readings from Last 3 Encounters:  03/12/21 32.08 kg/m  10/27/20 32.23 kg/m  09/08/20 32.23 kg/m      Outpatient Encounter Medications as of 03/12/2021  Medication Sig   clopidogrel (PLAVIX) 75 MG tablet Take 1 tablet (75 mg total) by mouth daily.   colchicine 0.6 MG tablet Take 1.2 mg (2 tablets) by mouth x1, then 0.6 mg (1 tablet) 1 hour later.   DULoxetine (CYMBALTA) 60 MG capsule Take 1 capsule (60 mg total) by mouth daily.   fish oil-omega-3 fatty acids 1000 MG capsule Take 2 g by mouth daily.   gabapentin (NEURONTIN) 100 MG capsule Take 2 capsules (200 mg total) by mouth 2 (two) times daily.   lisinopril-hydrochlorothiazide (ZESTORETIC) 20-25 MG tablet Take 1 tablet by mouth  daily.   meclizine (ANTIVERT) 12.5 MG tablet Take 1 tablet (12.5 mg total) by mouth 3 (three) times daily as needed for dizziness.   omeprazole (PRILOSEC) 40 MG capsule Take 1 capsule (40 mg total) by mouth daily.   pravastatin (PRAVACHOL) 80 MG tablet Take 1 tablet (80 mg total) by mouth daily.   No facility-administered encounter medications on file as of 03/12/2021.    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  Eyes:  Negative for pain.  Respiratory:  Negative for shortness of breath.   Cardiovascular:  Negative for chest pain, palpitations and leg swelling.  Gastrointestinal:  Negative for abdominal pain.  Endocrine: Negative for polydipsia.  Skin:  Negative for rash.  Neurological:  Negative for dizziness, weakness and headaches.  Hematological:  Does not bruise/bleed easily.  All other systems reviewed and are negative.     Objective:   Physical Exam Vitals and nursing note reviewed.  Constitutional:      Appearance: Normal appearance. He is well-developed.  HENT:     Head: Normocephalic.     Nose: Nose normal.  Eyes:     Pupils: Pupils are equal, round, and reactive to light.  Neck:     Thyroid: No thyroid mass or thyromegaly.     Vascular: No carotid bruit or JVD.      Trachea: Phonation normal.  Cardiovascular:     Rate and Rhythm: Normal rate and regular rhythm.  Pulmonary:     Effort: Pulmonary effort is normal. No respiratory distress.     Breath sounds: Normal breath sounds.  Abdominal:     General: Bowel sounds are normal.     Palpations: Abdomen is soft.     Tenderness: There is no abdominal tenderness.  Musculoskeletal:        General: Normal range of motion.     Cervical back: Normal range of motion and neck supple.  Lymphadenopathy:     Cervical: No cervical adenopathy.  Skin:    General: Skin is warm and dry.  Neurological:     Mental Status: He is alert and oriented to person, place, and time.  Psychiatric:        Behavior: Behavior normal.        Thought Content: Thought content normal.        Judgment: Judgment normal.    BP 138/81   Pulse 72   Temp 98.2 F (36.8 C) (Temporal)   Resp 20   Ht _0  (1.727 m)   Wt 211 lb (95.7 kg)   SpO2 97%   BMI 32.08 kg/m          Assessment & Plan:  David Garza comes in today with chief complaint of Medical Management of Chronic Issues   Diagnosis and orders addressed:  1. Primary hypertension Low sodium diet - lisinopril-hydrochlorothiazide (ZESTORETIC) 20-25 MG tablet; Take 1 tablet by mouth daily.  Dispense: 90 tablet; Refill: 1 - CBC with Differential/Platelet - CMP14+EGFR  2. Pure hypertriglyceridemia Low fat diet - pravastatin (PRAVACHOL) 80 MG tablet; Take 1 tablet (80 mg total) by mouth daily.  Dispense: 90 tablet; Refill: 1 - Lipid panel  3. Late effect of cerebrovascular accident (CVA) - clopidogrel (PLAVIX) 75 MG tablet; Take 1 tablet (75 mg total) by mouth daily.  Dispense: 90 tablet; Refill: 1  4. Gastroesophageal reflux disease without esophagitis Avoid spicy foods Do not eat 2 hours prior to bedtime - omeprazole (PRILOSEC) 40 MG capsule; Take 1 capsule (40 mg total) by mouth daily.  Dispense: 90 capsule; Refill: 1  5. Neuropathy due to medical  condition (HCC) - DULoxetine (CYMBALTA) 60 MG capsule; Take 1 capsule (60 mg total) by mouth daily.  Dispense: 90 capsule; Refill: 1 - gabapentin (NEURONTIN) 100 MG capsule; Take 2 capsules (200 mg total) by mouth 2 (two) times daily.  Dispense: 120 capsule; Refill: 1  6. Idiopathic chronic gout of right foot without tophus Handout given on low purine diet  7. BMI 33.0-33.9,adult Discussed diet and exercise  for person with BMI >25 Will recheck weight in 3-6 months    Labs pending Health Maintenance reviewed Diet and exercise encouraged  Follow up plan: 6 months   Mary-Margaret Hassell Done, FNP

## 2021-03-12 NOTE — Addendum Note (Signed)
Addended by: Rolena Infante on: 03/12/2021 08:31 AM   Modules accepted: Orders

## 2021-03-12 NOTE — Patient Instructions (Signed)
Gout ?Gout is painful swelling of your joints. Gout is a type of arthritis. It is caused by having too much uric acid in your body. Uric acid is a chemical that is made when your body breaks down substances called purines. If your body has too much uric acid, sharp crystals can form and build up in your joints. This causes pain and swelling. ?Gout attacks can happen quickly and be very painful (acute gout). Over time, the attacks can affect more joints and happen more often (chronic gout). ?What are the causes? ?Too much uric acid in your blood. This can happen because: ?Your kidneys do not remove enough uric acid from your blood. ?Your body makes too much uric acid. ?You eat too many foods that are high in purines. These foods include organ meats, some seafood, and beer. ?Trauma or stress. ?What increases the risk? ?Having a family history of gout. ?Being male and middle-aged. ?Being male and having gone through menopause. ?Being very overweight (obese). ?Drinking alcohol, especially beer. ?Not having enough water in the body (being dehydrated). ?Losing weight too quickly. ?Having an organ transplant. ?Having lead poisoning. ?Taking certain medicines. ?Having kidney disease. ?Having a skin condition called psoriasis. ?What are the signs or symptoms? ?An attack of acute gout usually happens in just one joint. The most common place is the big toe. Attacks often start at night. Other joints that may be affected include joints of the feet, ankle, knee, fingers, wrist, or elbow. Symptoms of an attack may include: ?Very bad pain. ?Warmth. ?Swelling. ?Stiffness. ?Shiny, red, or purple skin. ?Tenderness. The affected joint may be very painful to touch. ?Chills and fever. ?Chronic gout may cause symptoms more often. More joints may be involved. You may also have white or yellow lumps (tophi) on your hands or feet or in other areas near your joints. ?How is this treated? ?Treatment for this condition has two phases:  treating an acute attack and preventing future attacks. ?Acute gout treatment may include: ?NSAIDs. ?Steroids. These are taken by mouth or injected into a joint. ?Colchicine. This medicine relieves pain and swelling. It can be given by mouth or through an IV tube. ?Preventive treatment may include: ?Taking small doses of NSAIDs or colchicine daily. ?Using a medicine that reduces uric acid levels in your blood. ?Making changes to your diet. You may need to see a food expert (dietitian) about what to eat and drink to prevent gout. ?Follow these instructions at home: ?During a gout attack ? ?If told, put ice on the painful area: ?Put ice in a plastic bag. ?Place a towel between your skin and the bag. ?Leave the ice on for 20 minutes, 2-3 times a day. ?Raise (elevate) the painful joint above the level of your heart as often as you can. ?Rest the joint as much as possible. If the joint is in your leg, you may be given crutches. ?Follow instructions from your doctor about what you cannot eat or drink. ?Avoiding future gout attacks ?Eat a low-purine diet. Avoid foods and drinks such as: ?Liver. ?Kidney. ?Anchovies. ?Asparagus. ?Herring. ?Mushrooms. ?Mussels. ?Beer. ?Stay at a healthy weight. If you want to lose weight, talk with your doctor. Do not lose weight too fast. ?Start or continue an exercise plan as told by your doctor. ?Eating and drinking ?Drink enough fluids to keep your pee (urine) pale yellow. ?If you drink alcohol: ?Limit how much you use to: ?0-1 drink a day for women. ?0-2 drinks a day for men. ?Be aware of   how much alcohol is in your drink. In the [place] one drink equals one 12 oz bottle of beer (355 mL), one 5 oz glass of wine (148 mL), or one 1? oz glass of hard liquor (44 mL). ?General instructions ?Take over-the-counter and prescription medicines only as told by your doctor. ?Do not drive or use heavy machinery while taking prescription pain medicine. ?Return to your normal activities as told by your  doctor. Ask your doctor what activities are safe for you. ?Keep all follow-up visits as told by your doctor. This is important. ?Contact a doctor if: ?You have another gout attack. ?You still have symptoms of a gout attack after 10 days of treatment. ?You have problems (side effects) because of your medicines. ?You have chills or a fever. ?You have burning pain when you pee (urinate). ?You have pain in your lower back or belly. ?Get help right away if: ?You have very bad pain. ?Your pain cannot be controlled. ?You cannot pee. ?Summary ?Gout is painful swelling of the joints. ?The most common site of pain is the big toe, but it can affect other joints. ?Medicines and avoiding some foods can help to prevent and treat gout attacks. ?This information is not intended to replace advice given to you by your health care provider. Make sure you discuss any questions you have with your health care provider. ?Document Revised: 10/27/2017 Document Reviewed: 11/08/2017 ?Elsevier Patient Education ? 2022 [employer] ? ?

## 2021-07-16 ENCOUNTER — Other Ambulatory Visit: Payer: Self-pay

## 2021-07-16 MED ORDER — COLCHICINE 0.6 MG PO TABS: ORAL_TABLET | ORAL | 3 refills | Status: DC

## 2021-09-09 ENCOUNTER — Other Ambulatory Visit: Payer: Medicare HMO

## 2021-09-09 DIAGNOSIS — R7309 Other abnormal glucose: Secondary | ICD-10-CM | POA: Diagnosis not present

## 2021-09-09 DIAGNOSIS — E781 Pure hyperglyceridemia: Secondary | ICD-10-CM

## 2021-09-09 DIAGNOSIS — E119 Type 2 diabetes mellitus without complications: Secondary | ICD-10-CM

## 2021-09-09 DIAGNOSIS — I10 Essential (primary) hypertension: Secondary | ICD-10-CM | POA: Diagnosis not present

## 2021-09-10 LAB — CBC WITH DIFFERENTIAL/PLATELET
Basophils Absolute: 0.1 10*3/uL (ref 0.0–0.2)
Basos: 1 %
EOS (ABSOLUTE): 0.3 10*3/uL (ref 0.0–0.4)
Eos: 4 %
Hematocrit: 47.1 % (ref 37.5–51.0)
Hemoglobin: 16.5 g/dL (ref 13.0–17.7)
Immature Grans (Abs): 0 10*3/uL (ref 0.0–0.1)
Immature Granulocytes: 0 %
Lymphocytes Absolute: 1.3 10*3/uL (ref 0.7–3.1)
Lymphs: 19 %
MCH: 31 pg (ref 26.6–33.0)
MCHC: 35 g/dL (ref 31.5–35.7)
MCV: 88 fL (ref 79–97)
Monocytes Absolute: 0.5 10*3/uL (ref 0.1–0.9)
Monocytes: 7 %
Neutrophils Absolute: 4.8 10*3/uL (ref 1.4–7.0)
Neutrophils: 69 %
Platelets: 218 10*3/uL (ref 150–450)
RBC: 5.33 x10E6/uL (ref 4.14–5.80)
RDW: 12.6 % (ref 11.6–15.4)
WBC: 7 10*3/uL (ref 3.4–10.8)

## 2021-09-10 LAB — CMP14+EGFR
ALT: 16 IU/L (ref 0–44)
AST: 18 IU/L (ref 0–40)
Albumin/Globulin Ratio: 2.3 — ABNORMAL HIGH (ref 1.2–2.2)
Albumin: 4.8 g/dL (ref 3.8–4.8)
Alkaline Phosphatase: 42 IU/L — ABNORMAL LOW (ref 44–121)
BUN/Creatinine Ratio: 12 (ref 10–24)
BUN: 13 mg/dL (ref 8–27)
Bilirubin Total: 0.8 mg/dL (ref 0.0–1.2)
CO2: 23 mmol/L (ref 20–29)
Calcium: 10.1 mg/dL (ref 8.6–10.2)
Chloride: 97 mmol/L (ref 96–106)
Creatinine, Ser: 1.09 mg/dL (ref 0.76–1.27)
Globulin, Total: 2.1 g/dL (ref 1.5–4.5)
Glucose: 125 mg/dL — ABNORMAL HIGH (ref 70–99)
Potassium: 4 mmol/L (ref 3.5–5.2)
Sodium: 136 mmol/L (ref 134–144)
Total Protein: 6.9 g/dL (ref 6.0–8.5)
eGFR: 73 mL/min/{1.73_m2} (ref >59)

## 2021-09-10 LAB — LIPID PANEL
Chol/HDL Ratio: 5 ratio (ref 0.0–5.0)
Cholesterol, Total: 159 mg/dL (ref 100–199)
HDL: 32 mg/dL — ABNORMAL LOW (ref >39)
LDL Chol Calc (NIH): 98 mg/dL (ref 0–99)
Triglycerides: 163 mg/dL — ABNORMAL HIGH (ref 0–149)
VLDL Cholesterol Cal: 29 mg/dL (ref 5–40)

## 2021-09-14 ENCOUNTER — Encounter: Payer: Self-pay | Admitting: Nurse Practitioner

## 2021-09-14 ENCOUNTER — Ambulatory Visit (INDEPENDENT_AMBULATORY_CARE_PROVIDER_SITE_OTHER): Payer: Medicare HMO | Admitting: Nurse Practitioner

## 2021-09-14 VITALS — BP 141/86 | HR 76 | Temp 97.8°F | Resp 20 | Ht 68.0 in | Wt 216.0 lb

## 2021-09-14 DIAGNOSIS — I6930 Unspecified sequelae of cerebral infarction: Secondary | ICD-10-CM

## 2021-09-14 DIAGNOSIS — Z6833 Body mass index (BMI) 33.0-33.9, adult: Secondary | ICD-10-CM

## 2021-09-14 DIAGNOSIS — M1A0710 Idiopathic chronic gout, right ankle and foot, without tophus (tophi): Secondary | ICD-10-CM | POA: Diagnosis not present

## 2021-09-14 DIAGNOSIS — E781 Pure hyperglyceridemia: Secondary | ICD-10-CM

## 2021-09-14 DIAGNOSIS — I10 Essential (primary) hypertension: Secondary | ICD-10-CM

## 2021-09-14 DIAGNOSIS — G63 Polyneuropathy in diseases classified elsewhere: Secondary | ICD-10-CM | POA: Diagnosis not present

## 2021-09-14 DIAGNOSIS — K219 Gastro-esophageal reflux disease without esophagitis: Secondary | ICD-10-CM

## 2021-09-14 MED ORDER — OMEPRAZOLE 40 MG PO CPDR: 40.0000 mg | DELAYED_RELEASE_CAPSULE | Freq: Every day | ORAL | 1 refills | Status: DC

## 2021-09-14 MED ORDER — DULOXETINE HCL 60 MG PO CPEP: 60.0000 mg | ORAL_CAPSULE | Freq: Every day | ORAL | 1 refills | Status: DC

## 2021-09-14 MED ORDER — LISINOPRIL-HYDROCHLOROTHIAZIDE 20-25 MG PO TABS: 1.0000 | ORAL_TABLET | Freq: Every day | ORAL | 1 refills | Status: DC

## 2021-09-14 MED ORDER — CLOPIDOGREL BISULFATE 75 MG PO TABS: 75.0000 mg | ORAL_TABLET | Freq: Every day | ORAL | 1 refills | Status: DC

## 2021-09-14 MED ORDER — PRAVASTATIN SODIUM 80 MG PO TABS: 80.0000 mg | ORAL_TABLET | Freq: Every day | ORAL | 1 refills | Status: DC

## 2021-09-14 MED ORDER — GABAPENTIN 100 MG PO CAPS: 200.0000 mg | ORAL_CAPSULE | Freq: Two times a day (BID) | ORAL | 1 refills | Status: DC

## 2021-09-14 NOTE — Patient Instructions (Signed)

## 2021-09-14 NOTE — Progress Notes (Signed)
? ?Subjective:  ? ? Patient ID: David Garza, male    DOB: 08-Jul-1950, 71 y.o.   MRN: [id number] ? ? ?Chief Complaint: medical management of chronic issues  ?  ? ?HPI: ? ?David Garza is a 71 y.o. who identifies as a male who was assigned male at birth.  ? 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 ? ?Comes in today for follow up of the following chronic medical issues: ? ?1. Primary hypertension ?No c/o chest pain, sob or headaches. His wife checks his blood pressure at home and it rune around 062-694 systolic. ?BP Readings from Last 3 Encounters:  ?03/12/21 138/81  ?09/08/20 140/90  ?06/11/20 (!) 151/95  ? ? ? ?2. Pure hypertriglyceridemia ?He does watch his diet and stays very active on his farm. ?Lab Results  ?Component Value Date  ? CHOL 159 09/09/2021  ? HDL 32 (L) 09/09/2021  ? [place] 98 09/09/2021  ? TRIG 163 (H) 09/09/2021  ? CHOLHDL 5.0 09/09/2021  ? ? ? ?3. Gastroesophageal reflux disease without esophagitis ?Is on omeprazole and is doing well. ? ?4. Late effect of cerebrovascular accident (CVA) ?5. Neuropathy due to medical condition [facility]) ?Only effect he has from his stroke is his neuropathy in his right hand and foot. It has improved some, but still bothers him. Is stlil on plavix without any bleeding issues. ? ?6. Idiopathic chronic gout of right foot without tophus ?No recent gout flare ups ? ?7. BMI 33.0-33.9,adult ?Weight is up 5 lbs ?Wt Readings from Last 3 Encounters:  ?09/14/21 216 lb (98 kg)  ?03/12/21 211 lb (95.7 kg)  ?10/27/20 212 lb (96.2 kg)  ? ?BMI Readings from Last 3 Encounters:  ?09/14/21 32.84 kg/m?  ?03/12/21 32.08 kg/m?  ?10/27/20 32.23 kg/m?  ? ? ? ? ? ?New complaints: ?None today ? ?Allergies  ?Allergen Reactions  ? Meloxicam Hypertension  ? Nsaids Hypertension  ? ?Outpatient Encounter Medications as of 09/14/2021  ?Medication Sig  ? clopidogrel (PLAVIX) 75 MG tablet Take 1 tablet (75 mg total) by mouth daily.  ? colchicine 0.6 MG tablet Take 1.2 mg  (2 tablets) by mouth x1, then 0.6 mg (1 tablet) 1 hour later.  ? DULoxetine (CYMBALTA) 60 MG capsule Take 1 capsule (60 mg total) by mouth daily.  ? fish oil-omega-3 fatty acids 1000 MG capsule Take 2 g by mouth daily.  ? gabapentin (NEURONTIN) 100 MG capsule Take 2 capsules (200 mg total) by mouth 2 (two) times daily.  ? lisinopril-hydrochlorothiazide (ZESTORETIC) 20-25 MG tablet Take 1 tablet by mouth daily.  ? meclizine (ANTIVERT) 12.5 MG tablet Take 1 tablet (12.5 mg total) by mouth 3 (three) times daily as needed for dizziness.  ? omeprazole (PRILOSEC) 40 MG capsule Take 1 capsule (40 mg total) by mouth daily.  ? pravastatin (PRAVACHOL) 80 MG tablet Take 1 tablet (80 mg total) by mouth daily.  ? ?No facility-administered encounter medications on file as of 09/14/2021.  ? ? ?Past Surgical History:  ?Procedure Laterality Date  ? KNEE ARTHROSCOPY Right   ? SHOULDER SURGERY Right   ? ? ?Family History  ?Problem Relation Age of Onset  ? Cancer Mother   ?     pancreatic  ? Alzheimer's disease Mother 62  ? Diabetes Sister   ? Aneurysm Sister   ?     questionable - possible heart attack   ? Coronary artery disease Maternal Grandfather   ? Alcohol abuse Maternal Grandfather   ? GI Disease  Maternal Grandfather   ? Early death Father   ? Alzheimer's disease Paternal Uncle 33  ? Aneurysm Paternal Uncle   ? Cancer Sister   ?     male  ? ? ? ? ?Controlled substance contract: n/a ? ? ? ? ?Review of Systems  ?Constitutional:  Negative for diaphoresis.  ?Eyes:  Negative for pain.  ?Respiratory:  Negative for shortness of breath.   ?Cardiovascular:  Negative for chest pain, palpitations and leg swelling.  ?Gastrointestinal:  Negative for abdominal pain.  ?Endocrine: Negative for polydipsia.  ?Skin:  Negative for rash.  ?Neurological:  Negative for dizziness, weakness and headaches.  ?Hematological:  Does not bruise/bleed easily.  ?All other systems reviewed and are negative. ? ?   ?Objective:  ? Physical Exam ?Vitals and  nursing note reviewed.  ?Constitutional:   ?   Appearance: Normal appearance. He is well-developed.  ?HENT:  ?   Head: Normocephalic.  ?   Nose: Nose normal.  ?   Mouth/Throat:  ?   Mouth: Mucous membranes are moist.  ?   Pharynx: Oropharynx is clear.  ?Eyes:  ?   Pupils: Pupils are equal, round, and reactive to light.  ?Neck:  ?   Thyroid: No thyroid mass or thyromegaly.  ?   Vascular: No carotid bruit or JVD.  ?   Trachea: Phonation normal.  ?Cardiovascular:  ?   Rate and Rhythm: Normal rate and regular rhythm.  ?Pulmonary:  ?   Effort: Pulmonary effort is normal. No respiratory distress.  ?   Breath sounds: Normal breath sounds.  ?Abdominal:  ?   General: Bowel sounds are normal.  ?   Palpations: Abdomen is soft.  ?   Tenderness: There is no abdominal tenderness.  ?Musculoskeletal:     ?   General: Normal range of motion.  ?   Cervical back: Normal range of motion and neck supple.  ?Lymphadenopathy:  ?   Cervical: No cervical adenopathy.  ?Skin: ?   General: Skin is warm and dry.  ?Neurological:  ?   Mental Status: He is alert and oriented to person, place, and time.  ?Psychiatric:     ?   Behavior: Behavior normal.     ?   Thought Content: Thought content normal.     ?   Judgment: Judgment normal.  ? ? ?BP (!) 141/86   Pulse 76   Temp 97.8 ?F (36.6 ?C) (Temporal)   Resp 20   Ht '5\' 8"'$  (6.144 m)   Wt 216 lb (98 kg)   SpO2 96%   BMI 32.84 kg/m?  ? ? ? ?   ?Assessment & Plan:  ? ?David Garza comes in today with chief complaint of No chief complaint on file. ? ? ?Diagnosis and orders addressed: ? ?1. Primary hypertension ?Low sodium diet ?- lisinopril-hydrochlorothiazide (ZESTORETIC) 20-25 MG tablet; Take 1 tablet by mouth daily.  Dispense: 90 tablet; Refill: 1 ? ?2. Pure hypertriglyceridemia ?Low fat diet ?- pravastatin (PRAVACHOL) 80 MG tablet; Take 1 tablet (80 mg total) by mouth daily.  Dispense: 90 tablet; Refill: 1 ? ?3. Gastroesophageal reflux disease without esophagitis ?Avoid spicy foods ?Do not  eat 2 hours prior to bedtime ?- omeprazole (PRILOSEC) 40 MG capsule; Take 1 capsule (40 mg total) by mouth daily.  Dispense: 90 capsule; Refill: 1 ? ?4. Late effect of cerebrovascular accident (CVA) ?Fall ptrevention ?- clopidogrel (PLAVIX) 75 MG tablet; Take 1 tablet (75 mg total) by mouth daily.  Dispense: 90 tablet; Refill:  1 ? ?5. Neuropathy due to medical condition [facility]) ?- DULoxetine (CYMBALTA) 60 MG capsule; Take 1 capsule (60 mg total) by mouth daily.  Dispense: 90 capsule; Refill: 1 ?- gabapentin (NEURONTIN) 100 MG capsule; Take 2 capsules (200 mg total) by mouth 2 (two) times daily.  Dispense: 120 capsule; Refill: 1 ? ?6. Idiopathic chronic gout of right foot without tophus ? ?7. BMI 33.0-33.9,adult ?Discussed diet and exercise for person with BMI >25 ?Will recheck weight in 3-6 months ? ?Adde HGBA1c ? ?Labs pending ?Health Maintenance reviewed ?Diet and exercise encouraged ? ?Follow up plan: ?3 months ? ? ?Mary-Margaret Hassell Done, FNP ? ?

## 2021-09-15 LAB — HGB A1C W/O EAG: Hgb A1c MFr Bld: 5.9 % — ABNORMAL HIGH (ref 4.8–5.6)

## 2021-09-15 LAB — SPECIMEN STATUS REPORT

## 2021-09-16 DIAGNOSIS — E119 Type 2 diabetes mellitus without complications: Secondary | ICD-10-CM | POA: Insufficient documentation

## 2021-09-16 NOTE — Progress Notes (Signed)
diabetes

## 2021-10-28 ENCOUNTER — Ambulatory Visit (INDEPENDENT_AMBULATORY_CARE_PROVIDER_SITE_OTHER): Payer: Medicare HMO

## 2021-10-28 VITALS — Wt 211.0 lb

## 2021-10-28 DIAGNOSIS — Z0000 Encounter for general adult medical examination without abnormal findings: Secondary | ICD-10-CM

## 2021-10-28 NOTE — Patient Instructions (Signed)
David Garza , Thank you for taking time to come for your Medicare Wellness Visit. I appreciate your ongoing commitment to your health goals. Please review the following plan we discussed and let me know if I can assist you in the future.   Screening recommendations/referrals: Colonoscopy: Done 05/05/2013 - Repeat in 10 years Recommended yearly ophthalmology/optometry visit for glaucoma screening and checkup Recommended yearly dental visit for hygiene and checkup  Vaccinations: Influenza vaccine: Done 01/30/2021 - Repeat annually  Pneumococcal vaccine: Done 03/14/2017 & 07/05/2018       Tdap vaccine: Done 09/17/2012 - Repeat in 10 years Shingles vaccine: Done 11/20/2020 & 01/30/2021   Covid-19: Done 07/01/2019 & 08/01/2019  Advanced directives: Please bring a copy of your health care power of attorney and living will to the office to be added to your chart at your convenience.   Conditions/risks identified: Keep up the great work going to the gym! Aim for 30 minutes of exercise or brisk walking, 6-8 glasses of water, and 5 servings of fruits and vegetables each day. Review the tips at the end of this summary for tips to lose weight  Next appointment: Follow up in one year for your annual wellness visit.   Preventive Care 70 Years and Older, Male  Preventive care refers to lifestyle choices and visits with your health care provider that can promote health and wellness. What does preventive care include? A yearly physical exam. This is also called an annual well check. Dental exams once or twice a year. Routine eye exams. Ask your health care provider how often you should have your eyes checked. Personal lifestyle choices, including: Daily care of your teeth and gums. Regular physical activity. Eating a healthy diet. Avoiding tobacco and drug use. Limiting alcohol use. Practicing safe sex. Taking low doses of aspirin every day. Taking vitamin and mineral supplements as recommended by your health  care provider. What happens during an annual well check? The services and screenings done by your health care provider during your annual well check will depend on your age, overall health, lifestyle risk factors, and family history of disease. Counseling  Your health care provider may ask you questions about your: Alcohol use. Tobacco use. Drug use. Emotional well-being. Home and relationship well-being. Sexual activity. Eating habits. History of falls. Memory and ability to understand (cognition). Work and work Statistician. Screening  You may have the following tests or measurements: Height, weight, and BMI. Blood pressure. Lipid and cholesterol levels. These may be checked every 5 years, or more frequently if you are over 70 years old. Skin check. Lung cancer screening. You may have this screening every year starting at age 70 if you have a 30-pack-year history of smoking and currently smoke or have quit within the past 15 years. Fecal occult blood test (FOBT) of the stool. You may have this test every year starting at age 70. Flexible sigmoidoscopy or colonoscopy. You may have a sigmoidoscopy every 5 years or a colonoscopy every 10 years starting at age 70. Prostate cancer screening. Recommendations will vary depending on your family history and other risks. Hepatitis C blood test. Hepatitis B blood test. Sexually transmitted disease (STD) testing. Diabetes screening. This is done by checking your blood sugar (glucose) after you have not eaten for a while (fasting). You may have this done every 1-3 years. Abdominal aortic aneurysm (AAA) screening. You may need this if you are a current or former smoker. Osteoporosis. You may be screened starting at age 70 if you are  at high risk. Talk with your health care provider about your test results, treatment options, and if necessary, the need for more tests. Vaccines  Your health care provider may recommend certain vaccines, such  as: Influenza vaccine. This is recommended every year. Tetanus, diphtheria, and acellular pertussis (Tdap, Td) vaccine. You may need a Td booster every 10 years. Zoster vaccine. You may need this after age 70. Pneumococcal 13-valent conjugate (PCV13) vaccine. One dose is recommended after age 70. Pneumococcal polysaccharide (PPSV23) vaccine. One dose is recommended after age 70. Talk to your health care provider about which screenings and vaccines you need and how often you need them. This information is not intended to replace advice given to you by your health care provider. Make sure you discuss any questions you have with your health care provider. Document Released: 05/15/2015 Document Revised: 01/06/2016 Document Reviewed: 02/17/2015 Elsevier Interactive Patient Education  2017 [place] Prevention in the Home Falls can cause injuries. They can happen to people of all ages. There are many things you can do to make your home safe and to help prevent falls. What can I do on the outside of my home? Regularly fix the edges of walkways and driveways and fix any cracks. Remove anything that might make you trip as you walk through a door, such as a raised step or threshold. Trim any bushes or trees on the path to your home. Use bright outdoor lighting. Clear any walking paths of anything that might make someone trip, such as rocks or tools. Regularly check to see if handrails are loose or broken. Make sure that both sides of any steps have handrails. Any raised decks and porches should have guardrails on the edges. Have any leaves, snow, or ice cleared regularly. Use sand or salt on walking paths during winter. Clean up any spills in your garage right away. This includes oil or grease spills. What can I do in the bathroom? Use night lights. Install grab bars by the toilet and in the tub and shower. Do not use towel bars as grab bars. Use non-skid mats or decals in the tub or  shower. If you need to sit down in the shower, use a plastic, non-slip stool. Keep the floor dry. Clean up any water that spills on the floor as soon as it happens. Remove soap buildup in the tub or shower regularly. Attach bath mats securely with double-sided non-slip rug tape. Do not have throw rugs and other things on the floor that can make you trip. What can I do in the bedroom? Use night lights. Make sure that you have a light by your bed that is easy to reach. Do not use any sheets or blankets that are too big for your bed. They should not hang down onto the floor. Have a firm chair that has side arms. You can use this for support while you get dressed. Do not have throw rugs and other things on the floor that can make you trip. What can I do in the kitchen? Clean up any spills right away. Avoid walking on wet floors. Keep items that you use a lot in easy-to-reach places. If you need to reach something above you, use a strong step stool that has a grab bar. Keep electrical cords out of the way. Do not use floor polish or wax that makes floors slippery. If you must use wax, use non-skid floor wax. Do not have throw rugs and other things on the floor  that can make you trip. What can I do with my stairs? Do not leave any items on the stairs. Make sure that there are handrails on both sides of the stairs and use them. Fix handrails that are broken or loose. Make sure that handrails are as long as the stairways. Check any carpeting to make sure that it is firmly attached to the stairs. Fix any carpet that is loose or worn. Avoid having throw rugs at the top or bottom of the stairs. If you do have throw rugs, attach them to the floor with carpet tape. Make sure that you have a light switch at the top of the stairs and the bottom of the stairs. If you do not have them, ask someone to add them for you. What else can I do to help prevent falls? Wear shoes that: Do not have high heels. Have  rubber bottoms. Are comfortable and fit you well. Are closed at the toe. Do not wear sandals. If you use a stepladder: Make sure that it is fully opened. Do not climb a closed stepladder. Make sure that both sides of the stepladder are locked into place. Ask someone to hold it for you, if possible. Clearly mark and make sure that you can see: Any grab bars or handrails. First and last steps. Where the edge of each step is. Use tools that help you move around (mobility aids) if they are needed. These include: Canes. Walkers. Scooters. Crutches. Turn on the lights when you go into a dark area. Replace any light bulbs as soon as they burn out. Set up your furniture so you have a clear path. Avoid moving your furniture around. If any of your floors are uneven, fix them. If there are any pets around you, be aware of where they are. Review your medicines with your doctor. Some medicines can make you feel dizzy. This can increase your chance of falling. Ask your doctor what other things that you can do to help prevent falls. This information is not intended to replace advice given to you by your health care provider. Make sure you discuss any questions you have with your health care provider. Document Released: 02/12/2009 Document Revised: 09/24/2015 Document Reviewed: 05/23/2014 Elsevier Interactive Patient Education  2017 [place] for [employer] Calories are units of energy. Your body needs a certain number of calories from food to keep going throughout the day. When you eat or drink more calories than your body needs, your body stores the extra calories mostly as fat. When you eat or drink fewer calories than your body needs, your body burns fat to get the energy it needs. Calorie counting means keeping track of how many calories you eat and drink each day. Calorie counting can be helpful if you need to lose weight. If you eat fewer calories than your body needs, you  should lose weight. Ask your health care provider what a healthy weight is for you. For calorie counting to work, you will need to eat the right number of calories each day to lose a healthy amount of weight per week. A dietitian can help you figure out how many calories you need in a day and will suggest ways to reach your calorie goal. A healthy amount of weight to lose each week is usually 1-2 lb (0.5-0.9 kg). This usually means that your daily calorie intake should be reduced by 500-750 calories. Eating 1,200-1,500 calories a day can help most women lose  weight. Eating 1,500-1,800 calories a day can help most men lose weight. What do I need to know about calorie counting? Work with your health care provider or dietitian to determine how many calories you should get each day. To meet your daily calorie goal, you will need to: Find out how many calories are in each food that you would like to eat. Try to do this before you eat. Decide how much of the food you plan to eat. Keep a food log. Do this by writing down what you ate and how many calories it had. To successfully lose weight, it is important to balance calorie counting with a healthy lifestyle that includes regular activity. Where do I find calorie information?  The number of calories in a food can be found on a Nutrition Facts label. If a food does not have a Nutrition Facts label, try to look up the calories online or ask your dietitian for help. Remember that calories are listed per serving. If you choose to have more than one serving of a food, you will have to multiply the calories per serving by the number of servings you plan to eat. For example, the label on a package of bread might say that a serving size is 1 slice and that there are 90 calories in a serving. If you eat 1 slice, you will have eaten 90 calories. If you eat 2 slices, you will have eaten 180 calories. How do I keep a food log? After each time that you eat, record the  following in your food log as soon as possible: What you ate. Be sure to include toppings, sauces, and other extras on the food. How much you ate. This can be measured in cups, ounces, or number of items. How many calories were in each food and drink. The total number of calories in the food you ate. Keep your food log near you, such as in a pocket-sized notebook or on an app or website on your mobile phone. Some programs will calculate calories for you and show you how many calories you have left to meet your daily goal. What are some portion-control tips? Know how many calories are in a serving. This will help you know how many servings you can have of a certain food. Use a measuring cup to measure serving sizes. You could also try weighing out portions on a kitchen scale. With time, you will be able to estimate serving sizes for some foods. Take time to put servings of different foods on your favorite plates or in your favorite bowls and cups so you know what a serving looks like. Try not to eat straight from a food's packaging, such as from a bag or box. Eating straight from the package makes it hard to see how much you are eating and can lead to overeating. Put the amount you would like to eat in a cup or on a plate to make sure you are eating the right portion. Use smaller plates, glasses, and bowls for smaller portions and to prevent overeating. Try not to multitask. For example, avoid watching TV or using your computer while eating. If it is time to eat, sit down at a table and enjoy your food. This will help you recognize when you are full. It will also help you be more mindful of what and how much you are eating. What are tips for following this plan? Reading food labels Check the calorie count compared with the serving size.  The serving size may be smaller than what you are used to eating. Check the source of the calories. Try to choose foods that are high in protein, fiber, and vitamins,  and low in saturated fat, trans fat, and sodium. Shopping Read nutrition labels while you shop. This will help you make healthy decisions about which foods to buy. Pay attention to nutrition labels for low-fat or fat-free foods. These foods sometimes have the same number of calories or more calories than the full-fat versions. They also often have added sugar, starch, or salt to make up for flavor that was removed with the fat. Make a grocery list of lower-calorie foods and stick to it. Cooking Try to cook your favorite foods in a healthier way. For example, try baking instead of frying. Use low-fat dairy products. Meal planning Use more fruits and vegetables. One-half of your plate should be fruits and vegetables. Include lean proteins, such as chicken, [place], and fish. Lifestyle Each week, aim to do one of the following: 150 minutes of moderate exercise, such as walking. 75 minutes of vigorous exercise, such as running. General information Know how many calories are in the foods you eat most often. This will help you calculate calorie counts faster. Find a way of tracking calories that works for you. Get creative. Try different apps or programs if writing down calories does not work for you. What foods should I eat?  Eat nutritious foods. It is better to have a nutritious, high-calorie food, such as an avocado, than a food with few nutrients, such as a bag of potato chips. Use your calories on foods and drinks that will fill you up and will not leave you hungry soon after eating. Examples of foods that fill you up are nuts and nut butters, vegetables, lean proteins, and high-fiber foods such as whole grains. High-fiber foods are foods with more than 5 g of fiber per serving. Pay attention to calories in drinks. Low-calorie drinks include water and unsweetened drinks. The items listed above may not be a complete list of foods and beverages you can eat. Contact a dietitian for more  information. What foods should I limit? Limit foods or drinks that are not good sources of vitamins, minerals, or protein or that are high in unhealthy fats. These include: Candy. Other sweets. Sodas, specialty coffee drinks, alcohol, and juice. The items listed above may not be a complete list of foods and beverages you should avoid. Contact a dietitian for more information. How do I count calories when eating out? Pay attention to portions. Often, portions are much larger when eating out. Try these tips to keep portions smaller: Consider sharing a meal instead of getting your own. If you get your own meal, eat only half of it. Before you start eating, ask for a container and put half of your meal into it. When available, consider ordering smaller portions from the menu instead of full portions. Pay attention to your food and drink choices. Knowing the way food is cooked and what is included with the meal can help you eat fewer calories. If calories are listed on the menu, choose the lower-calorie options. Choose dishes that include vegetables, fruits, whole grains, low-fat dairy products, and lean proteins. Choose items that are boiled, broiled, grilled, or steamed. Avoid items that are buttered, battered, fried, or served with cream sauce. Items labeled as crispy are usually fried, unless stated otherwise. Choose water, low-fat milk, unsweetened iced tea, or other drinks without added sugar. If  you want an alcoholic beverage, choose a lower-calorie option, such as a glass of wine or light beer. Ask for dressings, sauces, and syrups on the side. These are usually high in calories, so you should limit the amount you eat. If you want a salad, choose a garden salad and ask for grilled meats. Avoid extra toppings such as bacon, cheese, or fried items. Ask for the dressing on the side, or ask for olive oil and vinegar or lemon to use as dressing. Estimate how many servings of a food you are given.  Knowing serving sizes will help you be aware of how much food you are eating at restaurants. Where to find more information Centers for Disease Control and Prevention: http://www.wolf.info/ U.S. Department of Agriculture: http://www.wilson-mendoza.org/ Summary Calorie counting means keeping track of how many calories you eat and drink each day. If you eat fewer calories than your body needs, you should lose weight. A healthy amount of weight to lose per week is usually 1-2 lb (0.5-0.9 kg). This usually means reducing your daily calorie intake by 500-750 calories. The number of calories in a food can be found on a Nutrition Facts label. If a food does not have a Nutrition Facts label, try to look up the calories online or ask your dietitian for help. Use smaller plates, glasses, and bowls for smaller portions and to prevent overeating. Use your calories on foods and drinks that will fill you up and not leave you hungry shortly after a meal. This information is not intended to replace advice given to you by your health care provider. Make sure you discuss any questions you have with your health care provider. Document Revised: 05/30/2019 Document Reviewed: 05/30/2019 Elsevier Patient Education  [place].

## 2021-10-28 NOTE — Progress Notes (Signed)
Subjective:   David Garza is a 71 y.o. male who presents for Medicare Annual/Subsequent preventive examination.  Virtual Visit via Telephone Note  I connected with  David Garza on 10/28/21 at  8:15 AM EDT by telephone and verified that I am speaking with the correct person using two identifiers.  Location: Patient: Home Provider: [facility] Persons participating in the virtual visit: patient/Nurse Health Advisor   I discussed the limitations, risks, security and privacy concerns of performing an evaluation and management service by telephone and the availability of in person appointments. The patient expressed understanding and agreed to proceed.  Interactive audio and video telecommunications were attempted between this nurse and patient, however failed, due to patient having technical difficulties OR patient did not have access to video capability.  We continued and completed visit with audio only.  Some vital signs may be absent or patient reported.   David Garza E David Moser, LPN   Review of Systems     Cardiac Risk Factors include: advanced age (>42mn, >>32women);male gender;dyslipidemia;hypertension;obesity (BMI >30kg/m2);Other (see comment), Risk factor comments: hx of CVA     Objective:    Today's Vitals   10/28/21 0821  Weight: 211 lb (95.7 kg)   Body mass index is 32.08 kg/m.     10/28/2021    8:25 AM 10/27/2020    8:33 AM 10/25/2019    8:40 AM 09/19/2018    9:57 AM 05/16/2014    9:43 AM  Advanced Directives  Does Patient Have a Medical Advance Directive? No No No No No  Would patient like information on creating a medical advance directive? Yes (MAU/Ambulatory/Procedural Areas - Information given) Yes (MAU/Ambulatory/Procedural Areas - Information given) No - Patient declined No - Patient declined Yes - Educational materials given    Current Medications (verified) Outpatient Encounter Medications as of 10/28/2021  Medication Sig   clopidogrel (PLAVIX) 75 MG  tablet Take 1 tablet (75 mg total) by mouth daily.   DULoxetine (CYMBALTA) 60 MG capsule Take 1 capsule (60 mg total) by mouth daily.   fish oil-omega-3 fatty acids 1000 MG capsule Take 2 g by mouth daily.   gabapentin (NEURONTIN) 100 MG capsule Take 2 capsules (200 mg total) by mouth 2 (two) times daily.   lisinopril-hydrochlorothiazide (ZESTORETIC) 20-25 MG tablet Take 1 tablet by mouth daily.   omeprazole (PRILOSEC) 40 MG capsule Take 1 capsule (40 mg total) by mouth daily.   pravastatin (PRAVACHOL) 80 MG tablet Take 1 tablet (80 mg total) by mouth daily.   colchicine 0.6 MG tablet Take 1.2 mg (2 tablets) by mouth x1, then 0.6 mg (1 tablet) 1 hour later. (Patient not taking: Reported on 10/28/2021)   meclizine (ANTIVERT) 12.5 MG tablet Take 1 tablet (12.5 mg total) by mouth 3 (three) times daily as needed for dizziness. (Patient not taking: Reported on 10/28/2021)   No facility-administered encounter medications on file as of 10/28/2021.    Allergies (verified) Meloxicam and Nsaids   History: Past Medical History:  Diagnosis Date   Hyperlipidemia    Hypertension    Metabolic syndrome    [place]   Past Surgical History:  Procedure Laterality Date   KNEE ARTHROSCOPY Right    SHOULDER SURGERY Right    Family History  Problem Relation Age of Onset   Cancer Mother        pancreatic   Alzheimer's disease Mother 725  Diabetes Sister    Aneurysm Sister        questionable -  possible heart attack    Coronary artery disease Maternal Grandfather    Alcohol abuse Maternal Grandfather    GI Disease Maternal Grandfather    Early death Father    Alzheimer's disease Paternal Uncle 63   Aneurysm Paternal Uncle    Cancer Sister        male   Social History   Socioeconomic History   Marital status: Married    Spouse name: [name]    Number of children: 1   Years of education: 12   Highest education level: High school graduate  Occupational History   Occupation:  Retired    Comment: continues to farm   Tobacco Use   Smoking status: Former    Types: Cigarettes    Quit date: 04/01/1998    Years since quitting: 23.5   Smokeless tobacco: Never  Vaping Use   Vaping Use: Never used  Substance and Sexual Activity   Alcohol use: No   Drug use: No   Sexual activity: Not on file  Other Topics Concern   Not on file  Social History Narrative   Lives at home with Wife [name] and daughter [name]    He and his wife go to [place] and the [employer] 3x per week and 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   Social Determinants of Health   Financial Resource Strain: [place]  (10/28/2021)   Overall Financial Resource Strain (CARDIA)    Difficulty of Paying Living Expenses: Not hard at all  Food Insecurity: No Food Insecurity (10/28/2021)   Hunger Vital Sign    Worried About Running Out of Food in the Last Year: Never true    [place] in the Last Year: Never true  Transportation Needs: No Transportation Needs (10/28/2021)   PRAPARE - Hydrologist (Medical): No    Lack of Transportation (Non-Medical): No  Physical Activity: Sufficiently Active (10/28/2021)   Exercise Vital Sign    Days of Exercise per Week: 3 days    Minutes of Exercise per Session: 60 min  Stress: No Stress Concern Present (10/28/2021)   [place]    Feeling of Stress : Not at all  Social Connections: [place] (10/28/2021)   Social Connection and Isolation Panel [NHANES]    Frequency of Communication with Friends and Family: More than three times a week    Frequency of Social Gatherings with Friends and Family: More than three times a week    Attends Religious Services: More than 4 times per year    Active Member of [employer] or Organizations: Yes    Attends Music therapist: More than 4 times per year    Marital Status: Married    Tobacco Counseling Counseling given: Not  Answered   Clinical Intake:  Pre-visit preparation completed: Yes  Pain : No/denies pain     BMI - recorded: 32.08 Nutritional Status: BMI > 30  Obese Nutritional Risks: None Diabetes: Yes CBG done?: No Did pt. bring in CBG monitor from home?: No  How often do you need to have someone help you when you read instructions, pamphlets, or other written materials from your doctor or pharmacy?: 1 - Never  Diabetic? No  Interpreter Needed?: No  Information entered by :: Stylianos Stradling, LPN   Activities of Daily Living    10/28/2021    8:26 AM  In your present state of health, do you have any difficulty  performing the following activities:  Hearing? 0  Vision? 0  Difficulty concentrating or making decisions? 0  Walking or climbing stairs? 0  Dressing or bathing? 0  Doing errands, shopping? 0  Preparing Food and eating ? N  Using the Toilet? N  In the past six months, have you accidently leaked urine? N  Do you have problems with loss of bowel control? N  Managing your Medications? N  Managing your Finances? N  Housekeeping or managing your Housekeeping? N    Patient Care Team: [name], FNP as PCP - General (Nurse Practitioner) Burke Keels, MD (Surgery) [place], Homer Miller, MD as Consulting Physician (Dermatology)  Indicate any recent Medical Services you may have received from other than [facility] providers in the past year (date may be approximate).     Assessment:   This is a routine wellness examination for David Garza.  Hearing/Vision screen Hearing Screening - Comments:: Denies hearing difficulties   Vision Screening - Comments:: Wears reading glasses prn - up to date with routine eye exams with [facility]  Dietary issues and exercise activities discussed: Current Exercise Habits: Home exercise routine, Type of exercise: treadmill;strength training/weights, Time (Minutes): 60, Frequency (Times/Week): 3, Weekly Exercise (Minutes/Week): 180, Intensity:  Moderate, Exercise limited by: orthopedic condition(s);neurologic condition(s)   Goals Addressed             This Visit's Progress    Patient Stated   On track    10/28/2021 AWV Goal: Keep All Scheduled Appointments  Over the next year, patient will attend all scheduled appointments with their PCP and any specialists that they see.      Prevent falls       10/28/2021 - Stay active - hopes to lose some weight Hopes to restore his '65 [place]       Depression Screen    10/28/2021    8:24 AM 09/14/2021    7:58 AM 03/12/2021    8:08 AM 10/27/2020    8:20 AM 09/08/2020    8:22 AM 06/11/2020    8:29 AM 03/05/2020    8:27 AM  PHQ 2/9 Scores  PHQ - 2 Score 0 0 0 0 0 0 0  PHQ- 9 Score 0 0 0        Fall Risk    10/28/2021    8:22 AM 09/14/2021    7:58 AM 03/12/2021    8:07 AM 10/27/2020    8:30 AM 09/08/2020    8:22 AM  Fall Risk   Falls in the past year? 0 0 0 0 0  Number falls in past yr: 0   0   Injury with Fall? 0   0   Risk for fall due to : Orthopedic patient   Medication side effect;Other (Comment)   Risk for fall due to: Comment    neuropathy   Follow up Falls prevention discussed   Falls prevention discussed     FALL RISK PREVENTION PERTAINING TO THE HOME:  Any stairs in or around the home? Yes  If so, are there any without handrails? No  Home free of loose throw rugs in walkways, pet beds, electrical cords, etc? Yes  Adequate lighting in your home to reduce risk of falls? Yes   ASSISTIVE DEVICES UTILIZED TO PREVENT FALLS:  Life alert? No  Use of a cane, walker or w/c? No  Grab bars in the bathroom? No  Shower chair or bench in shower? No  Elevated toilet seat or a handicapped toilet? No   TIMED  UP AND GO:  Was the test performed? No . Telephonic visit  Cognitive Function:    09/13/2017   11:54 AM  MMSE - Mini Mental State Exam  Orientation to time 5  Orientation to Place 5  Registration 3  Attention/ Calculation 5  Recall 2  Language- name 2 objects  2  Language- repeat 1  Language- follow 3 step command 3  Language- read & follow direction 1  Write a sentence 1  Copy design 1  Total score 29        10/28/2021    8:27 AM 10/25/2019    8:44 AM 09/19/2018   10:04 AM  6CIT Screen  What Year? 0 points 0 points 0 points  What month? 0 points 0 points 0 points  What time? 0 points 0 points 0 points  Count back from 20 0 points 0 points 0 points  Months in reverse 0 points 0 points 0 points  Repeat phrase 0 points 0 points 0 points  Total Score 0 points 0 points 0 points    Immunizations Immunization History  Administered Date(s) Administered   Fluad Quad(high Dose 65+) 01/31/2019   Influenza, High Dose Seasonal PF 01/24/2018   Influenza,inj,Quad PF,6+ Mos 03/19/2013, 02/03/2014, 01/29/2015, 02/03/2017   Influenza-Unspecified 02/11/2016, 02/06/2020, 01/30/2021   Moderna Sars-Covid-2 Vaccination 07/01/2019, 08/01/2019   Pneumococcal Conjugate-13 03/14/2017   Pneumococcal Polysaccharide-23 07/05/2018   Tdap 09/17/2012   Zoster Recombinat (Shingrix) 11/20/2020, 01/30/2021   Zoster, Live 05/16/2014    TDAP status: Up to date  Flu Vaccine status: Up to date  Pneumococcal vaccine status: Up to date  Covid-19 vaccine status: Completed vaccines  Qualifies for Shingles Vaccine? Yes   Zostavax completed Yes   Shingrix Completed?: Yes  Screening Tests Health Maintenance  Topic Date Due   FOOT EXAM  Never done   OPHTHALMOLOGY EXAM  Never done   COVID-19 Vaccine (3 - Moderna series) 09/26/2019   INFLUENZA VACCINE  11/30/2021   HEMOGLOBIN A1C  03/12/2022   TETANUS/TDAP  09/18/2022   COLONOSCOPY (Pts 45-54yr Insurance coverage will need to be confirmed)  05/06/2023   Pneumonia Vaccine 70 Years old  Completed   Hepatitis C Screening  Completed   Zoster Vaccines- Shingrix  Completed   HPV VACCINES  Aged Out    Health Maintenance  Health Maintenance Due  Topic Date Due   FOOT EXAM  Never done   OPHTHALMOLOGY EXAM   Never done   COVID-19 Vaccine (3 - Moderna series) 09/26/2019    Colorectal cancer screening: Type of screening: Colonoscopy. Completed 05/05/2013. Repeat every 10 years  Lung Cancer Screening: (Low Dose CT Chest recommended if Age 70-80years, 30 pack-year currently smoking OR have quit w/in 15years.) does not qualify.  Additional Screening:  Hepatitis C Screening: does qualify; Completed 03/04/2015  Vision Screening: Recommended annual ophthalmology exams for early detection of glaucoma and other disorders of the eye. Is the patient up to date with their annual eye exam?  Yes  Who is the provider or what is the name of the office in which the patient attends annual eye exams[place] is not established with a provider, would they like to be referred to a provider to establish care? No .   Dental Screening: Recommended annual dental exams for proper oral hygiene  Community Resource Referral / Chronic Care Management: CRR required this visit?  No   CCM required this visit?  No      Plan:  I have personally reviewed and noted the following in the patient's chart:   Medical and social history Use of alcohol, tobacco or illicit drugs  Current medications and supplements including opioid prescriptions. Patient is not currently taking opioid prescriptions. Functional ability and status Nutritional status Physical activity Advanced directives List of other physicians Hospitalizations, surgeries, and ER visits in previous 12 months Vitals Screenings to include cognitive, depression, and falls Referrals and appointments  In addition, I have reviewed and discussed with patient certain preventive protocols, quality metrics, and best practice recommendations. A written personalized care plan for preventive services as well as general preventive health recommendations were provided to patient.     Sandrea Hammond, LPN   [id number]   Nurse Notes: None

## 2021-12-10 ENCOUNTER — Other Ambulatory Visit: Payer: Medicare HMO

## 2021-12-10 DIAGNOSIS — E119 Type 2 diabetes mellitus without complications: Secondary | ICD-10-CM

## 2021-12-10 DIAGNOSIS — E781 Pure hyperglyceridemia: Secondary | ICD-10-CM | POA: Diagnosis not present

## 2021-12-10 DIAGNOSIS — I10 Essential (primary) hypertension: Secondary | ICD-10-CM

## 2021-12-10 LAB — BAYER DCA HB A1C WAIVED: HB A1C (BAYER DCA - WAIVED): 5.4 % (ref 4.8–5.6)

## 2021-12-11 LAB — CBC WITH DIFFERENTIAL/PLATELET
Basophils Absolute: 0.1 10*3/uL (ref 0.0–0.2)
Basos: 1 %
EOS (ABSOLUTE): 0.4 10*3/uL (ref 0.0–0.4)
Eos: 5 %
Hematocrit: 47.3 % (ref 37.5–51.0)
Hemoglobin: 16.4 g/dL (ref 13.0–17.7)
Immature Grans (Abs): 0 10*3/uL (ref 0.0–0.1)
Immature Granulocytes: 0 %
Lymphocytes Absolute: 1.2 10*3/uL (ref 0.7–3.1)
Lymphs: 16 %
MCH: 30.6 pg (ref 26.6–33.0)
MCHC: 34.7 g/dL (ref 31.5–35.7)
MCV: 88 fL (ref 79–97)
Monocytes Absolute: 0.5 10*3/uL (ref 0.1–0.9)
Monocytes: 7 %
Neutrophils Absolute: 5 10*3/uL (ref 1.4–7.0)
Neutrophils: 71 %
Platelets: 212 10*3/uL (ref 150–450)
RBC: 5.36 x10E6/uL (ref 4.14–5.80)
RDW: 12.5 % (ref 11.6–15.4)
WBC: 7 10*3/uL (ref 3.4–10.8)

## 2021-12-11 LAB — CMP14+EGFR
ALT: 16 IU/L (ref 0–44)
AST: 17 IU/L (ref 0–40)
Albumin/Globulin Ratio: 2.2 (ref 1.2–2.2)
Albumin: 4.7 g/dL (ref 3.9–4.9)
Alkaline Phosphatase: 44 IU/L (ref 44–121)
BUN/Creatinine Ratio: 10 (ref 10–24)
BUN: 12 mg/dL (ref 8–27)
Bilirubin Total: 0.8 mg/dL (ref 0.0–1.2)
CO2: 26 mmol/L (ref 20–29)
Calcium: 10.1 mg/dL (ref 8.6–10.2)
Chloride: 99 mmol/L (ref 96–106)
Creatinine, Ser: 1.18 mg/dL (ref 0.76–1.27)
Globulin, Total: 2.1 g/dL (ref 1.5–4.5)
Glucose: 113 mg/dL — ABNORMAL HIGH (ref 70–99)
Potassium: 4.3 mmol/L (ref 3.5–5.2)
Sodium: 139 mmol/L (ref 134–144)
Total Protein: 6.8 g/dL (ref 6.0–8.5)
eGFR: 67 mL/min/{1.73_m2} (ref >59)

## 2021-12-11 LAB — LIPID PANEL
Chol/HDL Ratio: 5.2 ratio — ABNORMAL HIGH (ref 0.0–5.0)
Cholesterol, Total: 173 mg/dL (ref 100–199)
HDL: 33 mg/dL — ABNORMAL LOW (ref >39)
LDL Chol Calc (NIH): 106 mg/dL — ABNORMAL HIGH (ref 0–99)
Triglycerides: 196 mg/dL — ABNORMAL HIGH (ref 0–149)
VLDL Cholesterol Cal: 34 mg/dL (ref 5–40)

## 2021-12-14 ENCOUNTER — Encounter: Payer: Self-pay | Admitting: Nurse Practitioner

## 2021-12-14 ENCOUNTER — Ambulatory Visit (INDEPENDENT_AMBULATORY_CARE_PROVIDER_SITE_OTHER): Payer: Medicare HMO | Admitting: Nurse Practitioner

## 2021-12-14 VITALS — BP 134/77 | HR 75 | Temp 97.5°F | Resp 20 | Ht 68.0 in | Wt 209.0 lb

## 2021-12-14 DIAGNOSIS — G63 Polyneuropathy in diseases classified elsewhere: Secondary | ICD-10-CM | POA: Diagnosis not present

## 2021-12-14 DIAGNOSIS — Z6833 Body mass index (BMI) 33.0-33.9, adult: Secondary | ICD-10-CM

## 2021-12-14 DIAGNOSIS — I10 Essential (primary) hypertension: Secondary | ICD-10-CM

## 2021-12-14 DIAGNOSIS — M1A0710 Idiopathic chronic gout, right ankle and foot, without tophus (tophi): Secondary | ICD-10-CM

## 2021-12-14 DIAGNOSIS — I6930 Unspecified sequelae of cerebral infarction: Secondary | ICD-10-CM

## 2021-12-14 DIAGNOSIS — K219 Gastro-esophageal reflux disease without esophagitis: Secondary | ICD-10-CM | POA: Diagnosis not present

## 2021-12-14 DIAGNOSIS — E119 Type 2 diabetes mellitus without complications: Secondary | ICD-10-CM

## 2021-12-14 DIAGNOSIS — E781 Pure hyperglyceridemia: Secondary | ICD-10-CM

## 2021-12-14 MED ORDER — PRAVASTATIN SODIUM 80 MG PO TABS: 80.0000 mg | ORAL_TABLET | Freq: Every day | ORAL | 1 refills | Status: DC

## 2021-12-14 MED ORDER — CLOPIDOGREL BISULFATE 75 MG PO TABS
75.0000 mg | ORAL_TABLET | Freq: Every day | ORAL | 1 refills | Status: DC
Start: 2021-12-14 — End: 2022-06-16

## 2021-12-14 MED ORDER — DULOXETINE HCL 60 MG PO CPEP: 60.0000 mg | ORAL_CAPSULE | Freq: Every day | ORAL | 1 refills | Status: DC

## 2021-12-14 MED ORDER — LISINOPRIL-HYDROCHLOROTHIAZIDE 20-25 MG PO TABS: 1.0000 | ORAL_TABLET | Freq: Every day | ORAL | 1 refills | Status: DC

## 2021-12-14 MED ORDER — OMEPRAZOLE 40 MG PO CPDR: 40.0000 mg | DELAYED_RELEASE_CAPSULE | Freq: Every day | ORAL | 1 refills | Status: DC

## 2021-12-14 MED ORDER — GABAPENTIN 100 MG PO CAPS: 200.0000 mg | ORAL_CAPSULE | Freq: Two times a day (BID) | ORAL | 1 refills | Status: DC

## 2021-12-14 NOTE — Patient Instructions (Signed)
Diabetes Mellitus and Foot Care Foot care is an important part of your health, especially when you have diabetes. Diabetes may cause you to have problems because of poor blood flow (circulation) to your feet and legs, which can cause your skin to: Become thinner and drier. Break more easily. Heal more slowly. Peel and crack. You may also have nerve damage (neuropathy) in your legs and feet, causing decreased feeling in them. This means that you may not notice minor injuries to your feet that could lead to more serious problems. Noticing and addressing any potential problems early is the best way to prevent future foot problems. How to care for your feet Foot hygiene  Wash your feet daily with warm water and mild soap. Do not use hot water. Then, pat your feet and the areas between your toes until they are completely dry. Do not soak your feet as this can dry your skin. Trim your toenails straight across. Do not dig under them or around the cuticle. File the edges of your nails with an emery board or nail file. Apply a moisturizing lotion or petroleum jelly to the skin on your feet and to dry, brittle toenails. Use lotion that does not contain alcohol and is unscented. Do not apply lotion between your toes. Shoes and socks Wear clean socks or stockings every day. Make sure they are not too tight. Do not wear knee-high stockings since they may decrease blood flow to your legs. Wear shoes that fit properly and have enough cushioning. Always look in your shoes before you put them on to be sure there are no objects inside. To break in new shoes, wear them for just a few hours a day. This prevents injuries on your feet. Wounds, scrapes, corns, and calluses  Check your feet daily for blisters, cuts, bruises, sores, and redness. If you cannot see the bottom of your feet, use a mirror or ask someone for help. Do not cut corns or calluses or try to remove them with medicine. If you find a minor scrape,  cut, or break in the skin on your feet, keep it and the skin around it clean and dry. You may clean these areas with mild soap and water. Do not clean the area with peroxide, alcohol, or iodine. If you have a wound, scrape, corn, or callus on your foot, look at it several times a day to make sure it is healing and not infected. Check for: Redness, swelling, or pain. Fluid or blood. Warmth. Pus or a bad smell. General tips Do not cross your legs. This may decrease blood flow to your feet. Do not use heating pads or hot water bottles on your feet. They may burn your skin. If you have lost feeling in your feet or legs, you may not know this is happening until it is too late. Protect your feet from hot and cold by wearing shoes, such as at the beach or on hot pavement. Schedule a complete foot exam at least once a year (annually) or more often if you have foot problems. Report any cuts, sores, or bruises to your health care provider immediately. Where to find more information American Diabetes Association: www.diabetes.org Association of Diabetes Care & Education Specialists: www.diabeteseducator.org Contact a health care provider if: You have a medical condition that increases your risk of infection and you have any cuts, sores, or bruises on your feet. You have an injury that is not healing. You have redness on your legs or feet. You   feel burning or tingling in your legs or feet. You have pain or cramps in your legs and feet. Your legs or feet are numb. Your feet always feel cold. You have pain around any toenails. Get help right away if: You have a wound, scrape, corn, or callus on your foot and: You have pain, swelling, or redness that gets worse. You have fluid or blood coming from the wound, scrape, corn, or callus. Your wound, scrape, corn, or callus feels warm to the touch. You have pus or a bad smell coming from the wound, scrape, corn, or callus. You have a fever. You have a red  line going up your leg. Summary Check your feet every day for blisters, cuts, bruises, sores, and redness. Apply a moisturizing lotion or petroleum jelly to the skin on your feet and to dry, brittle toenails. Wear shoes that fit properly and have enough cushioning. If you have foot problems, report any cuts, sores, or bruises to your health care provider immediately. Schedule a complete foot exam at least once a year (annually) or more often if you have foot problems. This information is not intended to replace advice given to you by your health care provider. Make sure you discuss any questions you have with your health care provider. Document Revised: 11/07/2019 Document Reviewed: 11/07/2019 Elsevier Patient Education  2023 [employer]  

## 2021-12-14 NOTE — Progress Notes (Signed)
Subjective:    Patient ID: David Garza, male    DOB: 11/01/1950, 71 y.o.   MRN: [id number]   Chief Complaint: medical management of chronic issues     HPI:  David Garza is a 71 y.o. who identifies as a male who was assigned male at birth.   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 Primary hypertension No c/o chest pain, sob or headache. Does not check blood pressure at home. BP Readings from Last 3 Encounters:  09/14/21 (!) 141/86  03/12/21 138/81  09/08/20 140/90     2. Pure hypertriglyceridemia Tries to watch diet and stays very active on his farm. Lab Results  Component Value Date   CHOL 173 12/10/2021   HDL 33 (L) 12/10/2021   LDLCALC 106 (H) 12/10/2021   TRIG 196 (H) 12/10/2021   CHOLHDL 5.2 (H) 12/10/2021     3. Diabetes mellitus without complication ([place]) He does not check his blood sugars at home. Lab Results  Component Value Date   HGBA1C 5.4 12/10/2021     4. Gastroesophageal reflux disease without esophagitis Is on omeprazole daily and is doing well.  5. Neuropathy due to medical condition [facility]) Has neuropathy on his right hand since his stroke. Just feels numb all the time. He is on gabapentin which helps some.  6. Late effect of cerebrovascular accident (CVA) Is doing well. Has completely recovered from his CVA, other than the neuropathy in his right hand.  7. Idiopathic chronic gout of right foot without tophus Denies any recent gout flare ups  8. BMI 33.0-33.9,adult Weight is down 2 lbs Wt Readings from Last 3 Encounters:  12/14/21 209 lb (94.8 kg)  10/28/21 211 lb (95.7 kg)  09/14/21 216 lb (98 kg)   BMI Readings from Last 3 Encounters:  12/14/21 31.78 kg/m  10/28/21 32.08 kg/m  09/14/21 32.84 kg/m      New complaints: None today  Allergies  Allergen Reactions   Meloxicam Hypertension   Nsaids Hypertension    Outpatient Encounter Medications as of 12/14/2021  Medication Sig   clopidogrel (PLAVIX) 75 MG tablet Take 1 tablet (75 mg total) by mouth daily.   colchicine 0.6 MG tablet Take 1.2 mg (2 tablets) by mouth x1, then 0.6 mg (1 tablet) 1 hour later. (Patient not taking: Reported on 10/28/2021)   DULoxetine (CYMBALTA) 60 MG capsule Take 1 capsule (60 mg total) by mouth daily.   fish oil-omega-3 fatty acids 1000 MG capsule Take 2 g by mouth daily.   gabapentin (NEURONTIN) 100 MG capsule Take 2 capsules (200 mg total) by mouth 2 (two) times daily.   lisinopril-hydrochlorothiazide (ZESTORETIC) 20-25 MG tablet Take 1 tablet by mouth daily.   meclizine (ANTIVERT) 12.5 MG tablet Take 1 tablet (12.5 mg total) by mouth 3 (three) times daily as needed for dizziness. (Patient not taking: Reported on 10/28/2021)   omeprazole (PRILOSEC) 40 MG capsule Take 1 capsule (40 mg total) by mouth daily.   pravastatin (PRAVACHOL) 80 MG tablet Take 1 tablet (80 mg total) by mouth daily.   No facility-administered encounter medications on file as of 12/14/2021.    Past Surgical History:  Procedure Laterality Date   KNEE ARTHROSCOPY Right    SHOULDER SURGERY Right     Family History  Problem Relation Age of Onset   Cancer Mother        pancreatic  Alzheimer's disease Mother 41   Diabetes Sister    Aneurysm Sister        questionable - possible heart attack    Coronary artery disease Maternal Grandfather    Alcohol abuse Maternal Grandfather    GI Disease Maternal Grandfather    Early death Father    Alzheimer's disease Paternal Uncle 35   Aneurysm Paternal Uncle    Cancer Sister        male      Controlled substance contract: n/a     Review of Systems  Constitutional:  Negative for diaphoresis.  Eyes:  Negative for pain.  Respiratory:  Negative for shortness of breath.   Cardiovascular:  Negative for chest pain, palpitations and leg swelling.  Gastrointestinal:  Negative for abdominal pain.   Endocrine: Negative for polydipsia.  Skin:  Negative for rash.  Neurological:  Negative for dizziness, weakness and headaches.  Hematological:  Does not bruise/bleed easily.  All other systems reviewed and are negative.      Objective:   Physical Exam Vitals and nursing note reviewed.  Constitutional:      Appearance: Normal appearance. He is well-developed.  HENT:     Head: Normocephalic.     Nose: Nose normal.     Mouth/Throat:     Mouth: Mucous membranes are moist.     Pharynx: Oropharynx is clear.  Eyes:     Pupils: Pupils are equal, round, and reactive to light.  Neck:     Thyroid: No thyroid mass or thyromegaly.     Vascular: No carotid bruit or JVD.     Trachea: Phonation normal.  Cardiovascular:     Rate and Rhythm: Normal rate and regular rhythm.  Pulmonary:     Effort: Pulmonary effort is normal. No respiratory distress.     Breath sounds: Normal breath sounds.  Abdominal:     General: Bowel sounds are normal.     Palpations: Abdomen is soft.     Tenderness: There is no abdominal tenderness.  Musculoskeletal:        General: Normal range of motion.     Cervical back: Normal range of motion and neck supple.  Lymphadenopathy:     Cervical: No cervical adenopathy.  Skin:    General: Skin is warm and dry.  Neurological:     Mental Status: He is alert and oriented to person, place, and time.  Psychiatric:        Behavior: Behavior normal.        Thought Content: Thought content normal.        Judgment: Judgment normal.    BP 134/77   Pulse 75   Temp (!) 97.5 F (36.4 C) (Temporal)   Resp 20   Ht '5\' 8"'$  (1.727 m)   Wt 209 lb (94.8 kg)   SpO2 95%   BMI 31.78 kg/m         Assessment & Plan:   David Garza comes in today with chief complaint of Medical Management of Chronic Issues   Diagnosis and orders addressed:  1. Primary hypertension Low sodium diet - lisinopril-hydrochlorothiazide (ZESTORETIC) 20-25 MG tablet; Take 1 tablet by  mouth daily.  Dispense: 90 tablet; Refill: 1  2. Pure hypertriglyceridemia Low fat diet - pravastatin (PRAVACHOL) 80 MG tablet; Take 1 tablet (80 mg total) by mouth daily.  Dispense: 90 tablet; Refill: 1  3. Diabetes mellitus without complication ([place]) Continue to watch carbs in diet  4. Gastroesophageal reflux disease without esophagitis Avoid spicy  foods Do not eat 2 hours prior to bedtime - omeprazole (PRILOSEC) 40 MG capsule; Take 1 capsule (40 mg total) by mouth daily.  Dispense: 90 capsule; Refill: 1  5. Neuropathy due to medical condition (HCC) - DULoxetine (CYMBALTA) 60 MG capsule; Take 1 capsule (60 mg total) by mouth daily.  Dispense: 90 capsule; Refill: 1 - gabapentin (NEURONTIN) 100 MG capsule; Take 2 capsules (200 mg total) by mouth 2 (two) times daily.  Dispense: 120 capsule; Refill: 1  6. Late effect of cerebrovascular accident (CVA) - clopidogrel (PLAVIX) 75 MG tablet; Take 1 tablet (75 mg total) by mouth daily.  Dispense: 90 tablet; Refill: 1  7. Idiopathic chronic gout of right foot without tophus Low purine diet  8. BMI 33.0-33.9,adult Discussed diet and exercise for person with BMI >25 Will recheck weight in 3-6 months    Labs pending Health Maintenance reviewed Diet and exercise encouraged  Follow up plan: 6 months   Mary-Margaret Hassell Done, FNP

## 2022-01-11 DIAGNOSIS — L821 Other seborrheic keratosis: Secondary | ICD-10-CM | POA: Diagnosis not present

## 2022-01-11 DIAGNOSIS — Z85828 Personal history of other malignant neoplasm of skin: Secondary | ICD-10-CM | POA: Diagnosis not present

## 2022-01-11 DIAGNOSIS — L570 Actinic keratosis: Secondary | ICD-10-CM | POA: Diagnosis not present

## 2022-01-11 DIAGNOSIS — L820 Inflamed seborrheic keratosis: Secondary | ICD-10-CM | POA: Diagnosis not present

## 2022-05-26 DIAGNOSIS — H5203 Hypermetropia, bilateral: Secondary | ICD-10-CM | POA: Diagnosis not present

## 2022-05-26 DIAGNOSIS — H52223 Regular astigmatism, bilateral: Secondary | ICD-10-CM | POA: Diagnosis not present

## 2022-05-26 DIAGNOSIS — H524 Presbyopia: Secondary | ICD-10-CM | POA: Diagnosis not present

## 2022-05-26 DIAGNOSIS — H2513 Age-related nuclear cataract, bilateral: Secondary | ICD-10-CM | POA: Diagnosis not present

## 2022-05-26 DIAGNOSIS — H43811 Vitreous degeneration, right eye: Secondary | ICD-10-CM | POA: Diagnosis not present

## 2022-05-26 LAB — HM DIABETES EYE EXAM

## 2022-06-13 ENCOUNTER — Other Ambulatory Visit: Payer: Self-pay | Admitting: Family Medicine

## 2022-06-13 ENCOUNTER — Other Ambulatory Visit: Payer: Medicare HMO

## 2022-06-13 DIAGNOSIS — E119 Type 2 diabetes mellitus without complications: Secondary | ICD-10-CM | POA: Diagnosis not present

## 2022-06-13 DIAGNOSIS — Z125 Encounter for screening for malignant neoplasm of prostate: Secondary | ICD-10-CM

## 2022-06-13 LAB — CBC WITH DIFFERENTIAL/PLATELET
Basophils Absolute: 0.1 x10E3/uL (ref 0.0–0.2)
Basos: 1 %
EOS (ABSOLUTE): 0.4 x10E3/uL (ref 0.0–0.4)
Eos: 5 %
Hematocrit: 49 % (ref 37.5–51.0)
Hemoglobin: 16.6 g/dL (ref 13.0–17.7)
Immature Grans (Abs): 0 x10E3/uL (ref 0.0–0.1)
Immature Granulocytes: 0 %
Lymphocytes Absolute: 1.2 x10E3/uL (ref 0.7–3.1)
Lymphs: 17 %
MCH: 30.5 pg (ref 26.6–33.0)
MCHC: 33.9 g/dL (ref 31.5–35.7)
MCV: 90 fL (ref 79–97)
Monocytes Absolute: 0.5 x10E3/uL (ref 0.1–0.9)
Monocytes: 6 %
Neutrophils Absolute: 5.2 x10E3/uL (ref 1.4–7.0)
Neutrophils: 71 %
Platelets: 224 x10E3/uL (ref 150–450)
RBC: 5.45 x10E6/uL (ref 4.14–5.80)
RDW: 12.6 % (ref 11.6–15.4)
WBC: 7.3 x10E3/uL (ref 3.4–10.8)

## 2022-06-13 LAB — CMP14+EGFR
ALT: 15 IU/L (ref 0–44)
AST: 14 IU/L (ref 0–40)
Albumin/Globulin Ratio: 2.6 — ABNORMAL HIGH (ref 1.2–2.2)
Albumin: 4.9 g/dL (ref 3.9–4.9)
Alkaline Phosphatase: 45 IU/L (ref 44–121)
BUN/Creatinine Ratio: 9 — ABNORMAL LOW (ref 10–24)
BUN: 10 mg/dL (ref 8–27)
Bilirubin Total: 0.8 mg/dL (ref 0.0–1.2)
CO2: 24 mmol/L (ref 20–29)
Calcium: 10.3 mg/dL — ABNORMAL HIGH (ref 8.6–10.2)
Chloride: 99 mmol/L (ref 96–106)
Creatinine, Ser: 1.12 mg/dL (ref 0.76–1.27)
Globulin, Total: 1.9 g/dL (ref 1.5–4.5)
Glucose: 125 mg/dL — ABNORMAL HIGH (ref 70–99)
Potassium: 4.8 mmol/L (ref 3.5–5.2)
Sodium: 138 mmol/L (ref 134–144)
Total Protein: 6.8 g/dL (ref 6.0–8.5)
eGFR: 71 mL/min/{1.73_m2} (ref >59)

## 2022-06-13 LAB — LIPID PANEL
Chol/HDL Ratio: 5 ratio (ref 0.0–5.0)
Cholesterol, Total: 160 mg/dL (ref 100–199)
HDL: 32 mg/dL — ABNORMAL LOW (ref >39)
LDL Chol Calc (NIH): 96 mg/dL (ref 0–99)
Triglycerides: 186 mg/dL — ABNORMAL HIGH (ref 0–149)
VLDL Cholesterol Cal: 32 mg/dL (ref 5–40)

## 2022-06-13 LAB — BAYER DCA HB A1C WAIVED: HB A1C (BAYER DCA - WAIVED): 6 % — ABNORMAL HIGH (ref 4.8–5.6)

## 2022-06-16 ENCOUNTER — Ambulatory Visit (INDEPENDENT_AMBULATORY_CARE_PROVIDER_SITE_OTHER): Payer: Medicare HMO | Admitting: Nurse Practitioner

## 2022-06-16 ENCOUNTER — Encounter: Payer: Self-pay | Admitting: Nurse Practitioner

## 2022-06-16 VITALS — BP 135/82 | HR 62 | Temp 98.1°F | Resp 20 | Ht 68.0 in | Wt 210.0 lb

## 2022-06-16 DIAGNOSIS — G63 Polyneuropathy in diseases classified elsewhere: Secondary | ICD-10-CM | POA: Diagnosis not present

## 2022-06-16 DIAGNOSIS — E781 Pure hyperglyceridemia: Secondary | ICD-10-CM

## 2022-06-16 DIAGNOSIS — I6930 Unspecified sequelae of cerebral infarction: Secondary | ICD-10-CM | POA: Diagnosis not present

## 2022-06-16 DIAGNOSIS — M1A0710 Idiopathic chronic gout, right ankle and foot, without tophus (tophi): Secondary | ICD-10-CM | POA: Diagnosis not present

## 2022-06-16 DIAGNOSIS — K219 Gastro-esophageal reflux disease without esophagitis: Secondary | ICD-10-CM

## 2022-06-16 DIAGNOSIS — E119 Type 2 diabetes mellitus without complications: Secondary | ICD-10-CM

## 2022-06-16 DIAGNOSIS — Z6833 Body mass index (BMI) 33.0-33.9, adult: Secondary | ICD-10-CM | POA: Diagnosis not present

## 2022-06-16 DIAGNOSIS — I10 Essential (primary) hypertension: Secondary | ICD-10-CM

## 2022-06-16 MED ORDER — DULOXETINE HCL 60 MG PO CPEP: 60.0000 mg | ORAL_CAPSULE | Freq: Every day | ORAL | 1 refills | Status: DC

## 2022-06-16 MED ORDER — PRAVASTATIN SODIUM 80 MG PO TABS: 80.0000 mg | ORAL_TABLET | Freq: Every day | ORAL | 1 refills | Status: DC

## 2022-06-16 MED ORDER — OMEPRAZOLE 40 MG PO CPDR: 40.0000 mg | DELAYED_RELEASE_CAPSULE | Freq: Every day | ORAL | 1 refills | Status: DC

## 2022-06-16 MED ORDER — LISINOPRIL-HYDROCHLOROTHIAZIDE 20-25 MG PO TABS: 1.0000 | ORAL_TABLET | Freq: Every day | ORAL | 1 refills | Status: DC

## 2022-06-16 MED ORDER — GABAPENTIN 100 MG PO CAPS: 200.0000 mg | ORAL_CAPSULE | Freq: Two times a day (BID) | ORAL | 1 refills | Status: DC

## 2022-06-16 MED ORDER — CLOPIDOGREL BISULFATE 75 MG PO TABS: 75.0000 mg | ORAL_TABLET | Freq: Every day | ORAL | 1 refills | Status: DC

## 2022-06-16 NOTE — Progress Notes (Signed)
Subjective:    Patient ID: David Garza, male    DOB: 05-Jun-1950, 72 y.o.   MRN: [id number]   Chief Complaint: medical management of chronic issues     HPI:  David Garza is a 72 y.o. who identifies as a male who was assigned male at birth.   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 Primary hypertension No c/o chest pain, sob or headache. Does check bloodpressure athome. Runs around [id number] systolic BP Readings from Last 3 Encounters:  12/14/21 134/77  09/14/21 (!) 141/86  03/12/21 138/81     2. Pure hypertriglyceridemia His wife tries to watch what she cooks for him to eat. 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 Lab Results  Component Value Date   CHOL 160 06/13/2022   HDL 32 (L) 06/13/2022   LDLCALC 96 06/13/2022   TRIG 186 (H) 06/13/2022   CHOLHDL 5.0 06/13/2022     3. Diabetes mellitus without complication ([place]) Does not check bood sugars at home. Lab Results  Component Value Date   HGBA1C 6.0 (H) 06/13/2022     4. Late effect of cerebrovascular accident (CVA) He has permanent effects on right side, mainly slight weakness in right leg and right hand, he has been on plavix since his stroke.  5. Gastroesophageal reflux disease without esophagitis Is on omeprazole daily. He does not take this at the same time he takes his plavix  6. Neuropathy due to medical condition [facility]) Right hand feels numb all the time. He is getting more use to it.  7. Idiopathic chronic gout of right foot without tophus Deneis any recent gout flare ups  8. BMI 33.0-33.9,adult No recent weight changes Wt Readings from Last 3 Encounters:  06/16/22 210 lb (95.3 kg)  12/14/21 209 lb (94.8 kg)  10/28/21 211 lb (95.7 kg)   BMI Readings from Last 3 Encounters:  06/16/22 31.93 kg/m  12/14/21 31.78 kg/m  10/28/21 32.08 kg/m     New complaints: None today  Allergies   Allergen Reactions   Meloxicam Hypertension   Nsaids Hypertension   Outpatient Encounter Medications as of 06/16/2022  Medication Sig   clopidogrel (PLAVIX) 75 MG tablet Take 1 tablet (75 mg total) by mouth daily.   colchicine 0.6 MG tablet Take 1.2 mg (2 tablets) by mouth x1, then 0.6 mg (1 tablet) 1 hour later.   DULoxetine (CYMBALTA) 60 MG capsule Take 1 capsule (60 mg total) by mouth daily.   fish oil-omega-3 fatty acids 1000 MG capsule Take 2 g by mouth daily.   gabapentin (NEURONTIN) 100 MG capsule Take 2 capsules (200 mg total) by mouth 2 (two) times daily.   lisinopril-hydrochlorothiazide (ZESTORETIC) 20-25 MG tablet Take 1 tablet by mouth daily.   meclizine (ANTIVERT) 12.5 MG tablet Take 1 tablet (12.5 mg total) by mouth 3 (three) times daily as needed for dizziness.   omeprazole (PRILOSEC) 40 MG capsule Take 1 capsule (40 mg total) by mouth daily.   pravastatin (PRAVACHOL) 80 MG tablet Take 1 tablet (80 mg total) by mouth daily.   No facility-administered encounter medications on file as of 06/16/2022.    Past Surgical History:  Procedure Laterality Date   KNEE ARTHROSCOPY Right    SHOULDER SURGERY Right     Family History  Problem Relation Age of Onset   Cancer Mother        pancreatic  Alzheimer's disease Mother 68   Diabetes Sister    Aneurysm Sister        questionable - possible heart attack    Coronary artery disease Maternal Grandfather    Alcohol abuse Maternal Grandfather    GI Disease Maternal Grandfather    Early death Father    Alzheimer's disease Paternal Uncle 26   Aneurysm Paternal Uncle    Cancer Sister        male      Controlled substance contract: n/a     Review of Systems  Constitutional:  Negative for diaphoresis.  Eyes:  Negative for pain.  Respiratory:  Negative for shortness of breath.   Cardiovascular:  Negative for chest pain, palpitations and leg swelling.  Gastrointestinal:  Negative for abdominal pain.  Endocrine:  Negative for polydipsia.  Skin:  Negative for rash.  Neurological:  Negative for dizziness, weakness and headaches.  Hematological:  Does not bruise/bleed easily.  All other systems reviewed and are negative.      Objective:   Physical Exam Vitals and nursing note reviewed.  Constitutional:      Appearance: Normal appearance. He is well-developed.  HENT:     Head: Normocephalic.     Nose: Nose normal.     Mouth/Throat:     Mouth: Mucous membranes are moist.     Pharynx: Oropharynx is clear.  Eyes:     Pupils: Pupils are equal, round, and reactive to light.  Neck:     Thyroid: No thyroid mass or thyromegaly.     Vascular: No carotid bruit or JVD.     Trachea: Phonation normal.  Cardiovascular:     Rate and Rhythm: Normal rate and regular rhythm.  Pulmonary:     Effort: Pulmonary effort is normal. No respiratory distress.     Breath sounds: Normal breath sounds.  Abdominal:     General: Bowel sounds are normal.     Palpations: Abdomen is soft.     Tenderness: There is no abdominal tenderness.  Musculoskeletal:        General: Normal range of motion.     Cervical back: Normal range of motion and neck supple.  Lymphadenopathy:     Cervical: No cervical adenopathy.  Skin:    General: Skin is warm and dry.  Neurological:     Mental Status: He is alert and oriented to person, place, and time.  Psychiatric:        Behavior: Behavior normal.        Thought Content: Thought content normal.        Judgment: Judgment normal.     BP 135/82   Pulse 62   Temp 98.1 F (36.7 C) (Temporal)   Resp 20   Ht 5' 8"$  (1.727 m)   Wt 210 lb (95.3 kg)   SpO2 97%   BMI 31.93 kg/m        Assessment & Plan:   David Garza comes in today with chief complaint of Medical Management of Chronic Issues   Diagnosis and orders addressed:  1. Primary hypertension Low sodium diet - lisinopril-hydrochlorothiazide (ZESTORETIC) 20-25 MG tablet; Take 1 tablet by mouth daily.   Dispense: 90 tablet; Refill: 1 - CBC with Differential/Platelet - CMP14+EGFR  2. Pure hypertriglyceridemia Low fat diet - pravastatin (PRAVACHOL) 80 MG tablet; Take 1 tablet (80 mg total) by mouth daily.  Dispense: 90 tablet; Refill: 1 - Lipid panel  3. Diabetes mellitus without complication ([place]) Watch carbs in diet - [facility]  Hb A1c Waived  4. Late effect of cerebrovascular accident (CVA) - clopidogrel (PLAVIX) 75 MG tablet; Take 1 tablet (75 mg total) by mouth daily.  Dispense: 90 tablet; Refill: 1  5. Gastroesophageal reflux disease without esophagitis Avoid spicy foods Do not eat 2 hours prior to bedtime  - omeprazole (PRILOSEC) 40 MG capsule; Take 1 capsule (40 mg total) by mouth daily.  Dispense: 90 capsule; Refill: 1  6. Neuropathy due to medical condition (HCC) - DULoxetine (CYMBALTA) 60 MG capsule; Take 1 capsule (60 mg total) by mouth daily.  Dispense: 90 capsule; Refill: 1 - gabapentin (NEURONTIN) 100 MG capsule; Take 2 capsules (200 mg total) by mouth 2 (two) times daily.  Dispense: 120 capsule; Refill: 1  7. Idiopathic chronic gout of right foot without tophus Low purine diet  8. BMI 33.0-33.9,adult Discussed diet and exercise for person with BMI >25 Will recheck weight in 3-6 months    Labs pending Health Maintenance reviewed Diet and exercise encouraged  Follow up plan: 6 months    Mary-Margaret Hassell Done, FNP

## 2022-08-26 ENCOUNTER — Ambulatory Visit (INDEPENDENT_AMBULATORY_CARE_PROVIDER_SITE_OTHER): Payer: Medicare HMO | Admitting: Family Medicine

## 2022-08-26 ENCOUNTER — Encounter: Payer: Self-pay | Admitting: Family Medicine

## 2022-08-26 VITALS — BP 136/85 | HR 100 | Temp 98.2°F | Ht 68.0 in | Wt 208.6 lb

## 2022-08-26 DIAGNOSIS — M25562 Pain in left knee: Secondary | ICD-10-CM | POA: Diagnosis not present

## 2022-08-26 MED ORDER — METHYLPREDNISOLONE ACETATE 40 MG/ML IJ SUSP
40.0000 mg | Freq: Once | INTRAMUSCULAR | Status: AC
  Administered 2022-08-26: 60 mg via INTRAMUSCULAR

## 2022-08-26 NOTE — Progress Notes (Signed)
Subjective:  Patient ID: David Garza, male    DOB: 1950/10/30, 72 y.o.   MRN: [id number]  Patient Care Team: Bennie Pierini, FNP as PCP - General (Nurse Practitioner) Erskine Speed, MD (Surgery) [place], Amy, MD as Consulting Physician (Dermatology)   Chief Complaint:  Knee Pain (Left knee- patient states that  it has been going on for a few weeks but got worse yesterday after being on his knees)   HPI: David Garza is a 73 y.o. male presenting on 08/26/2022 for Knee Pain (Left knee- patient states that  it has been going on for a few weeks but got worse yesterday after being on his knees)   Knee Pain  The incident occurred more than 1 week ago. The injury mechanism is unknown. The pain is present in the left knee. The quality of the pain is described as aching, burning, shooting and stabbing. The pain is moderate. The pain has been Constant since onset. Pertinent negatives include no inability to bear weight, loss of motion, loss of sensation, muscle weakness, numbness or tingling. He reports no foreign bodies present. The symptoms are aggravated by movement, palpation and weight bearing. He has tried ice, elevation and acetaminophen for the symptoms. The treatment provided no relief.    Relevant past medical, surgical, family, and social history reviewed and updated as indicated.  Allergies and medications reviewed and updated. Data reviewed: Chart in Epic.   Past Medical History:  Diagnosis Date   Hyperlipidemia    Hypertension    Metabolic syndrome    Stroke [facility]) 06/2016    Past Surgical History:  Procedure Laterality Date   KNEE ARTHROSCOPY Right    SHOULDER SURGERY Right     Social History   Socioeconomic History   Marital status: Married    Spouse name: Bonita Quin    Number of children: 1   Years of education: 12   Highest education level: High school graduate  Occupational History   Occupation: Retired    Comment: continues to farm   Tobacco  Use   Smoking status: Former    Types: Cigarettes    Quit date: 04/01/1998    Years since quitting: 24.4   Smokeless tobacco: Never  Vaping Use   Vaping Use: Never used  Substance and Sexual Activity   Alcohol use: No   Drug use: No   Sexual activity: Not on file  Other Topics Concern   Not on file  Social History Narrative   Lives at home with Wife Bonita Quin and daughter Tamera Punt    He and his wife go to [place] and the [employer] 3x per week and 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   Social Determinants of Health   Financial Resource Strain: Low Risk  (10/28/2021)   Overall Financial Resource Strain (CARDIA)    Difficulty of Paying Living Expenses: Not hard at all  Food Insecurity: No Food Insecurity (10/28/2021)   Hunger Vital Sign    Worried About Running Out of Food in the Last Year: Never true    Ran Out of Food in the Last Year: Never true  Transportation Needs: No Transportation Needs (10/28/2021)   PRAPARE - Administrator, Civil Service (Medical): No    Lack of Transportation (Non-Medical): No  Physical Activity: Sufficiently Active (10/28/2021)   Exercise Vital Sign    Days of Exercise per Week: 3 days    Minutes of Exercise per Session: 60 min  Stress: No Stress Concern  Present (10/28/2021)   [employer] of Occupational Health - Occupational Stress Questionnaire    Feeling of Stress : Not at all  Social Connections: Socially Integrated (10/28/2021)   Social Connection and Isolation Panel [NHANES]    Frequency of Communication with Friends and Family: More than three times a week    Frequency of Social Gatherings with Friends and Family: More than three times a week    Attends Religious Services: More than 4 times per year    Active Member of [employer] or Organizations: Yes    Attends Engineer, structural: More than 4 times per year    Marital Status: Married  Catering manager Violence: Not At Risk (10/28/2021)   Humiliation, Afraid, Rape, and Kick  questionnaire    Fear of Current or Ex-Partner: No    Emotionally Abused: No    Physically Abused: No    Sexually Abused: No    Outpatient Encounter Medications as of 08/26/2022  Medication Sig   clopidogrel (PLAVIX) 75 MG tablet Take 1 tablet (75 mg total) by mouth daily.   colchicine 0.6 MG tablet Take 1.2 mg (2 tablets) by mouth x1, then 0.6 mg (1 tablet) 1 hour later.   DULoxetine (CYMBALTA) 60 MG capsule Take 1 capsule (60 mg total) by mouth daily.   fish oil-omega-3 fatty acids 1000 MG capsule Take 2 g by mouth daily.   gabapentin (NEURONTIN) 100 MG capsule Take 2 capsules (200 mg total) by mouth 2 (two) times daily.   lisinopril-hydrochlorothiazide (ZESTORETIC) 20-25 MG tablet Take 1 tablet by mouth daily.   meclizine (ANTIVERT) 12.5 MG tablet Take 1 tablet (12.5 mg total) by mouth 3 (three) times daily as needed for dizziness.   omeprazole (PRILOSEC) 40 MG capsule Take 1 capsule (40 mg total) by mouth daily.   pravastatin (PRAVACHOL) 80 MG tablet Take 1 tablet (80 mg total) by mouth daily.   [EXPIRED] methylPREDNISolone acetate (DEPO-MEDROL) injection 40 mg    No facility-administered encounter medications on file as of 08/26/2022.    Allergies  Allergen Reactions   Meloxicam Hypertension   Nsaids Hypertension    Review of Systems  Constitutional:  Negative for activity change, appetite change, chills, diaphoresis, fatigue, fever and unexpected weight change.  HENT: Negative.    Eyes: Negative.  Negative for photophobia and visual disturbance.  Respiratory:  Negative for cough, chest tightness and shortness of breath.   Cardiovascular:  Negative for chest pain, palpitations and leg swelling.  Gastrointestinal:  Negative for abdominal distention, abdominal pain, blood in stool, constipation, diarrhea, nausea and vomiting.  Endocrine: Negative.  Negative for polydipsia, polyphagia and polyuria.  Genitourinary:  Negative for decreased urine volume, difficulty urinating,  dysuria, frequency and urgency.  Musculoskeletal:  Positive for arthralgias, gait problem and joint swelling. Negative for back pain and myalgias.  Skin: Negative.   Allergic/Immunologic: Negative.   Neurological:  Negative for dizziness, tingling, tremors, seizures, syncope, facial asymmetry, speech difficulty, weakness, light-headedness, numbness and headaches.  Hematological: Negative.   Psychiatric/Behavioral:  Negative for confusion, hallucinations, sleep disturbance and suicidal ideas.   All other systems reviewed and are negative.       Objective:  BP 136/85   Pulse 100   Temp 98.2 F (36.8 C) (Temporal)   Ht 5\' 8"  (1.727 m)   Wt 208 lb 9.6 oz (94.6 kg)   SpO2 93%   BMI 31.72 kg/m    Wt Readings from Last 3 Encounters:  08/26/22 208 lb 9.6 oz (94.6 kg)  06/16/22 210  lb (95.3 kg)  12/14/21 209 lb (94.8 kg)    Physical Exam Vitals and nursing note reviewed.  Constitutional:      General: He is not in acute distress.    Appearance: Normal appearance. He is well-developed and well-groomed. He is not ill-appearing, toxic-appearing or diaphoretic.  HENT:     Head: Normocephalic and atraumatic.     Jaw: There is normal jaw occlusion.     Right Ear: Hearing normal.     Left Ear: Hearing normal.     Nose: Nose normal.     Mouth/Throat:     Lips: Pink.     Mouth: Mucous membranes are moist.     Pharynx: Oropharynx is clear. Uvula midline.  Eyes:     General: Lids are normal.     Extraocular Movements: Extraocular movements intact.     Conjunctiva/sclera: Conjunctivae normal.     Pupils: Pupils are equal, round, and reactive to light.  Neck:     Thyroid: No thyroid mass, thyromegaly or thyroid tenderness.     Vascular: No carotid bruit or JVD.     Trachea: Trachea and phonation normal.  Cardiovascular:     Rate and Rhythm: Normal rate and regular rhythm.     Chest Wall: PMI is not displaced.     Pulses: Normal pulses.     Heart sounds: Normal heart sounds. No  murmur heard.    No friction rub. No gallop.  Pulmonary:     Effort: Pulmonary effort is normal. No respiratory distress.     Breath sounds: Normal breath sounds. No wheezing.  Abdominal:     General: Bowel sounds are normal. There is no distension or abdominal bruit.     Palpations: Abdomen is soft. There is no hepatomegaly or splenomegaly.     Tenderness: There is no abdominal tenderness. There is no right CVA tenderness or left CVA tenderness.     Hernia: No hernia is present.  Musculoskeletal:     Cervical back: Normal range of motion and neck supple.     Left upper leg: Normal.     Left knee: Swelling present. No deformity, effusion, erythema, ecchymosis, lacerations, bony tenderness or crepitus. Decreased range of motion. Tenderness present. No LCL laxity, MCL laxity, ACL laxity or PCL laxity.Normal alignment, normal meniscus and normal patellar mobility. Normal pulse.     Instability Tests: Anterior drawer test negative. Posterior drawer test negative. Anterior Lachman test negative. Medial McMurray test negative and lateral McMurray test negative.     Right lower leg: No edema.     Left lower leg: Normal. No edema.  Lymphadenopathy:     Cervical: No cervical adenopathy.  Skin:    General: Skin is warm and dry.     Capillary Refill: Capillary refill takes less than 2 seconds.     Coloration: Skin is not cyanotic, jaundiced or pale.     Findings: No rash.  Neurological:     General: No focal deficit present.     Mental Status: He is alert and oriented to person, place, and time.     Sensory: Sensation is intact.     Motor: Motor function is intact.     Coordination: Coordination is intact.     Gait: Gait is intact.     Deep Tendon Reflexes: Reflexes are normal and symmetric.  Psychiatric:        Attention and Perception: Attention and perception normal.        Mood and Affect: Mood and affect  normal.        Speech: Speech normal.        Behavior: Behavior normal. Behavior is  cooperative.        Thought Content: Thought content normal.        Cognition and Memory: Cognition and memory normal.        Judgment: Judgment normal.     Results for orders placed or performed in visit on 06/13/22  Lipid panel  Result Value Ref Range   Cholesterol, Total 160 100 - 199 mg/dL   Triglycerides 161 (H) 0 - 149 mg/dL   HDL 32 (L) >09 mg/dL   VLDL Cholesterol Cal 32 5 - 40 mg/dL   LDL Chol Calc (NIH) 96 0 - 99 mg/dL   Chol/HDL Ratio 5.0 0.0 - 5.0 ratio  CMP14+EGFR  Result Value Ref Range   Glucose 125 (H) 70 - 99 mg/dL   BUN 10 8 - 27 mg/dL   Creatinine, Ser 6.04 0.76 - 1.27 mg/dL   eGFR 71 >54 UJ/WJX/9.14   BUN/Creatinine Ratio 9 (L) 10 - 24   Sodium 138 134 - 144 mmol/L   Potassium 4.8 3.5 - 5.2 mmol/L   Chloride 99 96 - 106 mmol/L   CO2 24 20 - 29 mmol/L   Calcium 10.3 (H) 8.6 - 10.2 mg/dL   Total Protein 6.8 6.0 - 8.5 g/dL   Albumin 4.9 3.9 - 4.9 g/dL   Globulin, Total 1.9 1.5 - 4.5 g/dL   Albumin/Globulin Ratio 2.6 (H) 1.2 - 2.2   Bilirubin Total 0.8 0.0 - 1.2 mg/dL   Alkaline Phosphatase 45 44 - 121 IU/L   AST 14 0 - 40 IU/L   ALT 15 0 - 44 IU/L  CBC with Differential/Platelet  Result Value Ref Range   WBC 7.3 3.4 - 10.8 x10E3/uL   RBC 5.45 4.14 - 5.80 x10E6/uL   Hemoglobin 16.6 13.0 - 17.7 g/dL   Hematocrit 78.2 95.6 - 51.0 %   MCV 90 79 - 97 fL   MCH 30.5 26.6 - 33.0 pg   MCHC 33.9 31.5 - 35.7 g/dL   RDW 21.3 08.6 - 57.8 %   Platelets 224 150 - 450 x10E3/uL   Neutrophils 71 Not Estab. %   Lymphs 17 Not Estab. %   Monocytes 6 Not Estab. %   Eos 5 Not Estab. %   Basos 1 Not Estab. %   Neutrophils Absolute 5.2 1.4 - 7.0 x10E3/uL   Lymphocytes Absolute 1.2 0.7 - 3.1 x10E3/uL   Monocytes Absolute 0.5 0.1 - 0.9 x10E3/uL   EOS (ABSOLUTE) 0.4 0.0 - 0.4 x10E3/uL   Basophils Absolute 0.1 0.0 - 0.2 x10E3/uL   Immature Granulocytes 0 Not Estab. %   Immature Grans (Abs) 0.0 0.0 - 0.1 x10E3/uL  Bayer DCA Hb A1c Waived  Result Value Ref Range   HB  A1C (BAYER DCA - WAIVED) 6.0 (H) 4.8 - 5.6 %     Joint Injection/Arthrocentesis  Date/Time: 08/26/2022 12:00 PM  Performed by: Sonny Masters, FNP Authorized by: Sonny Masters, FNP  Indications: joint swelling and pain  Body area: knee Joint: left knee Local anesthesia used: yes  Anesthesia: Local anesthesia used: yes Local Anesthetic: co-phenylcaine spray  Sedation: Patient sedated: no  Preparation: Patient was prepped and draped in the usual sterile fashion. Needle size: 22 G Ultrasound guidance: no Approach: anterior Aspirate amount: 0 mL Methylprednisolone amount: 60 mg Lidocaine 2% amount: 3.5 mL Patient tolerance: patient tolerated the procedure well with no  immediate complications      Pertinent labs & imaging results that were available during my care of the patient were reviewed by me and considered in my medical decision making.  Assessment & Plan:  David Garza was seen today for knee pain.  Diagnoses and all orders for this visit:  Acute pain of left knee Tolerated injection well. Held in office for 10 minutes post procedure to monitor for bleeding. No signs of bleeding. Symptomatic care discussed in detail. Aware to report new, worsening, or persistent symptoms.  -     methylPREDNISolone acetate (DEPO-MEDROL) injection 40 mg -     Joint Injection/Arthrocentesis     Continue all other maintenance medications.  Follow up plan: Return if symptoms worsen or fail to improve.   Continue healthy lifestyle choices, including diet (rich in fruits, vegetables, and lean proteins, and low in salt and simple carbohydrates) and exercise (at least 30 minutes of moderate physical activity daily).  Educational handout given for knee injection   The above assessment and management plan was discussed with the patient. The patient verbalized understanding of and has agreed to the management plan. Patient is aware to call the clinic if they develop any new symptoms or if  symptoms persist or worsen. Patient is aware when to return to the clinic for a follow-up visit. Patient educated on when it is appropriate to go to the emergency department.   Kari Baars, FNP-C [facility] [phone]

## 2022-09-16 ENCOUNTER — Other Ambulatory Visit: Payer: Self-pay

## 2022-09-16 MED ORDER — CYCLOBENZAPRINE HCL 10 MG PO TABS: 10.0000 mg | ORAL_TABLET | Freq: Three times a day (TID) | ORAL | 0 refills | Status: DC | PRN

## 2022-11-01 ENCOUNTER — Ambulatory Visit (INDEPENDENT_AMBULATORY_CARE_PROVIDER_SITE_OTHER): Payer: Medicare HMO

## 2022-11-01 VITALS — Ht 68.0 in | Wt 209.0 lb

## 2022-11-01 DIAGNOSIS — Z0000 Encounter for general adult medical examination without abnormal findings: Secondary | ICD-10-CM | POA: Diagnosis not present

## 2022-11-01 NOTE — Patient Instructions (Signed)
David Garza , Thank you for taking time to come for your Medicare Wellness Visit. I appreciate your ongoing commitment to your health goals. Please review the following plan we discussed and let me know if I can assist you in the future.   These are the goals we discussed:  Goals      DIET - INCREASE WATER INTAKE     Patient Stated     10/28/2021 AWV Goal: Keep All Scheduled Appointments  Over the next year, patient will attend all scheduled appointments with their PCP and any specialists that they see.      Prevent falls     10/28/2021 - Stay active - hopes to lose some weight Hopes to restore his '65 [employer]        This is a list of the screening recommended for you and due dates:  Health Maintenance  Topic Date Due   Yearly kidney health urinalysis for diabetes  Never done   COVID-19 Vaccine (3 - 2023-24 season) 12/31/2021   DTaP/Tdap/Td vaccine (2 - Td or Tdap) 09/18/2022   Flu Shot  12/01/2022   Hemoglobin A1C  12/12/2022   Complete foot exam   12/15/2022   Colon Cancer Screening  05/06/2023   Eye exam for diabetics  05/27/2023   Yearly kidney function blood test for diabetes  06/14/2023   Medicare Annual Wellness Visit  11/01/2023   Pneumonia Vaccine  Completed   Hepatitis C Screening  Completed   Zoster (Shingles) Vaccine  Completed   HPV Vaccine  Aged Out    Advanced directives: Advance directive discussed with you today. I have provided a copy for you to complete at home and have notarized. Once this is complete please bring a copy in to our office so we can scan it into your chart. Information on Advanced Care Planning can be found at [facility] Advance Health Care Directives Advance Health Care Directives ([email or web address]    Conditions/risks identified: Aim for 30 minutes of exercise or brisk walking, 6-8 glasses of water, and 5 servings of fruits and vegetables each day.   Next appointment: Follow up in one year for your annual wellness visit.    Preventive Care 74 Years and Older, Male  Preventive care refers to lifestyle choices and visits with your health care provider that can promote health and wellness. What does preventive care include? A yearly physical exam. This is also called an annual well check. Dental exams once or twice a year. Routine eye exams. Ask your health care provider how often you should have your eyes checked. Personal lifestyle choices, including: Daily care of your teeth and gums. Regular physical activity. Eating a healthy diet. Avoiding tobacco and drug use. Limiting alcohol use. Practicing safe sex. Taking low doses of aspirin every day. Taking vitamin and mineral supplements as recommended by your health care provider. What happens during an annual well check? The services and screenings done by your health care provider during your annual well check will depend on your age, overall health, lifestyle risk factors, and family history of disease. Counseling  Your health care provider may ask you questions about your: Alcohol use. Tobacco use. Drug use. Emotional well-being. Home and relationship well-being. Sexual activity. Eating habits. History of falls. Memory and ability to understand (cognition). Work and work Astronomer. Screening  You may have the following tests or measurements: Height, weight, and BMI. Blood pressure. Lipid and cholesterol levels. These may be checked every 5 years, or more frequently  if you are over 75 years old. Skin check. Lung cancer screening. You may have this screening every year starting at age 23 if you have a 30-pack-year history of smoking and currently smoke or have quit within the past 15 years. Fecal occult blood test (FOBT) of the stool. You may have this test every year starting at age 71. Flexible sigmoidoscopy or colonoscopy. You may have a sigmoidoscopy every 5 years or a colonoscopy every 10 years starting at age 75. Prostate cancer  screening. Recommendations will vary depending on your family history and other risks. Hepatitis C blood test. Hepatitis B blood test. Sexually transmitted disease (STD) testing. Diabetes screening. This is done by checking your blood sugar (glucose) after you have not eaten for a while (fasting). You may have this done every 1-3 years. Abdominal aortic aneurysm (AAA) screening. You may need this if you are a current or former smoker. Osteoporosis. You may be screened starting at age 36 if you are at high risk. Talk with your health care provider about your test results, treatment options, and if necessary, the need for more tests. Vaccines  Your health care provider may recommend certain vaccines, such as: Influenza vaccine. This is recommended every year. Tetanus, diphtheria, and acellular pertussis (Tdap, Td) vaccine. You may need a Td booster every 10 years. Zoster vaccine. You may need this after age 18. Pneumococcal 13-valent conjugate (PCV13) vaccine. One dose is recommended after age 51. Pneumococcal polysaccharide (PPSV23) vaccine. One dose is recommended after age 45. Talk to your health care provider about which screenings and vaccines you need and how often you need them. This information is not intended to replace advice given to you by your health care provider. Make sure you discuss any questions you have with your health care provider. Document Released: 05/15/2015 Document Revised: 01/06/2016 Document Reviewed: 02/17/2015 Elsevier Interactive Patient Education  2017 [employer].  Fall Prevention in the Home Falls can cause injuries. They can happen to people of all ages. There are many things you can do to make your home safe and to help prevent falls. What can I do on the outside of my home? Regularly fix the edges of walkways and driveways and fix any cracks. Remove anything that might make you trip as you walk through a door, such as a raised step or threshold. Trim any  bushes or trees on the path to your home. Use bright outdoor lighting. Clear any walking paths of anything that might make someone trip, such as rocks or tools. Regularly check to see if handrails are loose or broken. Make sure that both sides of any steps have handrails. Any raised decks and porches should have guardrails on the edges. Have any leaves, snow, or ice cleared regularly. Use sand or salt on walking paths during winter. Clean up any spills in your garage right away. This includes oil or grease spills. What can I do in the bathroom? Use night lights. Install grab bars by the toilet and in the tub and shower. Do not use towel bars as grab bars. Use non-skid mats or decals in the tub or shower. If you need to sit down in the shower, use a plastic, non-slip stool. Keep the floor dry. Clean up any water that spills on the floor as soon as it happens. Remove soap buildup in the tub or shower regularly. Attach bath mats securely with double-sided non-slip rug tape. Do not have throw rugs and other things on the floor that can make  you trip. What can I do in the bedroom? Use night lights. Make sure that you have a light by your bed that is easy to reach. Do not use any sheets or blankets that are too big for your bed. They should not hang down onto the floor. Have a firm chair that has side arms. You can use this for support while you get dressed. Do not have throw rugs and other things on the floor that can make you trip. What can I do in the kitchen? Clean up any spills right away. Avoid walking on wet floors. Keep items that you use a lot in easy-to-reach places. If you need to reach something above you, use a strong step stool that has a grab bar. Keep electrical cords out of the way. Do not use floor polish or wax that makes floors slippery. If you must use wax, use non-skid floor wax. Do not have throw rugs and other things on the floor that can make you trip. What can I do  with my stairs? Do not leave any items on the stairs. Make sure that there are handrails on both sides of the stairs and use them. Fix handrails that are broken or loose. Make sure that handrails are as long as the stairways. Check any carpeting to make sure that it is firmly attached to the stairs. Fix any carpet that is loose or worn. Avoid having throw rugs at the top or bottom of the stairs. If you do have throw rugs, attach them to the floor with carpet tape. Make sure that you have a light switch at the top of the stairs and the bottom of the stairs. If you do not have them, ask someone to add them for you. What else can I do to help prevent falls? Wear shoes that: Do not have high heels. Have rubber bottoms. Are comfortable and fit you well. Are closed at the toe. Do not wear sandals. If you use a stepladder: Make sure that it is fully opened. Do not climb a closed stepladder. Make sure that both sides of the stepladder are locked into place. Ask someone to hold it for you, if possible. Clearly mark and make sure that you can see: Any grab bars or handrails. First and last steps. Where the edge of each step is. Use tools that help you move around (mobility aids) if they are needed. These include: Canes. Walkers. Scooters. Crutches. Turn on the lights when you go into a dark area. Replace any light bulbs as soon as they burn out. Set up your furniture so you have a clear path. Avoid moving your furniture around. If any of your floors are uneven, fix them. If there are any pets around you, be aware of where they are. Review your medicines with your doctor. Some medicines can make you feel dizzy. This can increase your chance of falling. Ask your doctor what other things that you can do to help prevent falls. This information is not intended to replace advice given to you by your health care provider. Make sure you discuss any questions you have with your health care  provider. Document Released: 02/12/2009 Document Revised: 09/24/2015 Document Reviewed: 05/23/2014 Elsevier Interactive Patient Education  2017 [employer].

## 2022-11-01 NOTE — Progress Notes (Signed)
Subjective:   David Garza is a 72 y.o. male who presents for Medicare Annual/Subsequent preventive examination.  Visit Complete: Virtual  I connected with  Sarajane Marek on 11/01/22 by a audio enabled telemedicine application and verified that I am speaking with the correct person using two identifiers.  Patient Location: Home  Provider Location: Home Office  I discussed the limitations of evaluation and management by telemedicine. The patient expressed understanding and agreed to proceed.  Patient Medicare AWV questionnaire was completed by the patient on 11/01/2022; I have confirmed that all information answered by patient is correct and no changes since this date.  Review of Systems     Cardiac Risk Factors include: advanced age (>96men, >25 women);male gender;hypertension;dyslipidemia     Objective:    Today's Vitals   11/01/22 0820  Weight: 209 lb (94.8 kg)  Height: 5\' 8"  (1.727 m)   Body mass index is 31.78 kg/m.     11/01/2022    8:24 AM 10/28/2021    8:25 AM 10/27/2020    8:33 AM 10/25/2019    8:40 AM 09/19/2018    9:57 AM 05/16/2014    9:43 AM  Advanced Directives  Does Patient Have a Medical Advance Directive? No No No No No No  Would patient like information on creating a medical advance directive? No - Patient declined Yes (MAU/Ambulatory/Procedural Areas - Information given) Yes (MAU/Ambulatory/Procedural Areas - Information given) No - Patient declined No - Patient declined Yes - Educational materials given    Current Medications (verified) Outpatient Encounter Medications as of 11/01/2022  Medication Sig   clopidogrel (PLAVIX) 75 MG tablet Take 1 tablet (75 mg total) by mouth daily.   colchicine 0.6 MG tablet Take 1.2 mg (2 tablets) by mouth x1, then 0.6 mg (1 tablet) 1 hour later.   cyclobenzaprine (FLEXERIL) 10 MG tablet Take 1 tablet (10 mg total) by mouth 3 (three) times daily as needed for muscle spasms.   DULoxetine (CYMBALTA) 60 MG capsule  Take 1 capsule (60 mg total) by mouth daily.   fish oil-omega-3 fatty acids 1000 MG capsule Take 2 g by mouth daily.   gabapentin (NEURONTIN) 100 MG capsule Take 2 capsules (200 mg total) by mouth 2 (two) times daily.   lisinopril-hydrochlorothiazide (ZESTORETIC) 20-25 MG tablet Take 1 tablet by mouth daily.   meclizine (ANTIVERT) 12.5 MG tablet Take 1 tablet (12.5 mg total) by mouth 3 (three) times daily as needed for dizziness.   omeprazole (PRILOSEC) 40 MG capsule Take 1 capsule (40 mg total) by mouth daily.   pravastatin (PRAVACHOL) 80 MG tablet Take 1 tablet (80 mg total) by mouth daily.   No facility-administered encounter medications on file as of 11/01/2022.    Allergies (verified) Meloxicam and Nsaids   History: Past Medical History:  Diagnosis Date   Hyperlipidemia    Hypertension    Metabolic syndrome    Stroke (HCC) 06/2016   Past Surgical History:  Procedure Laterality Date   KNEE ARTHROSCOPY Right    SHOULDER SURGERY Right    Family History  Problem Relation Age of Onset   Cancer Mother        pancreatic   Alzheimer's disease Mother 81   Diabetes Sister    Aneurysm Sister        questionable - possible heart attack    Coronary artery disease Maternal Grandfather    Alcohol abuse Maternal Grandfather    GI Disease Maternal Grandfather    Early death Father  Alzheimer's disease Paternal Uncle 51   Aneurysm Paternal Uncle    Cancer Sister        male   Social History   Socioeconomic History   Marital status: Married    Spouse name: Bonita Quin    Number of children: 1   Years of education: 12   Highest education level: High school graduate  Occupational History   Occupation: Retired    Comment: continues to farm   Tobacco Use   Smoking status: Former    Types: Cigarettes    Quit date: 04/01/1998    Years since quitting: 24.6   Smokeless tobacco: Never  Vaping Use   Vaping Use: Never used  Substance and Sexual Activity   Alcohol use: No   Drug  use: No   Sexual activity: Not on file  Other Topics Concern   Not on file  Social History Narrative   Lives at home with Wife Bonita Quin and daughter Tamera Punt    He and his wife go to [place] and the [employer] 3x per week and 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   Social Determinants of Health   Financial Resource Strain: Low Risk  (11/01/2022)   Overall Financial Resource Strain (CARDIA)    Difficulty of Paying Living Expenses: Not hard at all  Food Insecurity: No Food Insecurity (11/01/2022)   Hunger Vital Sign    Worried About Running Out of Food in the Last Year: Never true    Ran Out of Food in the Last Year: Never true  Transportation Needs: No Transportation Needs (11/01/2022)   PRAPARE - Administrator, Civil Service (Medical): No    Lack of Transportation (Non-Medical): No  Physical Activity: Sufficiently Active (11/01/2022)   Exercise Vital Sign    Days of Exercise per Week: 3 days    Minutes of Exercise per Session: 60 min  Stress: No Stress Concern Present (11/01/2022)   [employer] of Occupational Health - Occupational Stress Questionnaire    Feeling of Stress : Not at all  Social Connections: Socially Integrated (11/01/2022)   Social Connection and Isolation Panel [NHANES]    Frequency of Communication with Friends and Family: More than three times a week    Frequency of Social Gatherings with Friends and Family: More than three times a week    Attends Religious Services: More than 4 times per year    Active Member of [employer] or Organizations: Yes    Attends Engineer, structural: More than 4 times per year    Marital Status: Married    Tobacco Counseling Counseling given: Not Answered   Clinical Intake:  Pre-visit preparation completed: Yes  Pain : No/denies pain     Nutritional Risks: None Diabetes: No  How often do you need to have someone help you when you read instructions, pamphlets, or other written materials from your doctor or pharmacy?:  1 - Never  Interpreter Needed?: No  Information entered by :: Renie Ora, LPN   Activities of Daily Living    11/01/2022    8:24 AM  In your present state of health, do you have any difficulty performing the following activities:  Hearing? 0  Vision? 0  Difficulty concentrating or making decisions? 0  Walking or climbing stairs? 0  Dressing or bathing? 0  Doing errands, shopping? 0  Preparing Food and eating ? N  Using the Toilet? N  In the past six months, have you accidently leaked urine? N  Do you  have problems with loss of bowel control? N  Managing your Medications? N  Managing your Finances? N  Housekeeping or managing your Housekeeping? N    Patient Care Team: Bennie Pierini, FNP as PCP - General (Nurse Practitioner) Erskine Speed, MD (Surgery) [place], Amy, MD as Consulting Physician (Dermatology)  Indicate any recent Medical Services you may have received from other than [facility] providers in the past year (date may be approximate).     Assessment:   This is a routine wellness examination for Scout.  Hearing/Vision screen Vision Screening - Comments:: Wears rx glasses - up to date with routine eye exams with  Dr.Johnson   Dietary issues and exercise activities discussed:     Goals Addressed             This Visit's Progress    DIET - INCREASE WATER INTAKE         Depression Screen    11/01/2022    8:23 AM 08/26/2022   12:01 PM 12/14/2021    8:01 AM 10/28/2021    8:24 AM 09/14/2021    7:58 AM 03/12/2021    8:08 AM 10/27/2020    8:20 AM  PHQ 2/9 Scores  PHQ - 2 Score 0 0 0 0 0 0 0  PHQ- 9 Score  0 0 0 0 0     Fall Risk    11/01/2022    8:21 AM 08/26/2022   12:01 PM 12/14/2021    8:01 AM 10/28/2021    8:22 AM 09/14/2021    7:58 AM  Fall Risk   Falls in the past year? 0 0 0 0 0  Number falls in past yr: 0   0   Injury with Fall? 0   0   Risk for fall due to : No Fall Risks   Orthopedic patient   Follow up Falls prevention discussed   Falls  prevention discussed     MEDICARE RISK AT HOME:  Medicare Risk at Home - 11/01/22 1610     Any stairs in or around the home? Yes    If so, are there any without handrails? No    Home free of loose throw rugs in walkways, pet beds, electrical cords, etc? Yes    Adequate lighting in your home to reduce risk of falls? Yes    Life alert? No    Use of a cane, walker or w/c? No    Grab bars in the bathroom? Yes    Shower chair or bench in shower? No    Elevated toilet seat or a handicapped toilet? No             TIMED UP AND GO:  Was the test performed?  No    Cognitive Function:    09/13/2017   11:54 AM  MMSE - Mini Mental State Exam  Orientation to time 5  Orientation to Place 5  Registration 3  Attention/ Calculation 5  Recall 2  Language- name 2 objects 2  Language- repeat 1  Language- follow 3 step command 3  Language- read & follow direction 1  Write a sentence 1  Copy design 1  Total score 29        11/01/2022    8:25 AM 10/28/2021    8:27 AM 10/25/2019    8:44 AM 09/19/2018   10:04 AM  6CIT Screen  What Year? 0 points 0 points 0 points 0 points  What month? 0 points 0 points 0 points 0 points  What time? 0 points 0 points 0 points 0 points  Count back from 20 0 points 0 points 0 points 0 points  Months in reverse 0 points 0 points 0 points 0 points  Repeat phrase 0 points 0 points 0 points 0 points  Total Score 0 points 0 points 0 points 0 points    Immunizations Immunization History  Administered Date(s) Administered   Fluad Quad(high Dose 65+) 01/31/2019   Influenza, High Dose Seasonal PF 01/24/2018   Influenza,inj,Quad PF,6+ Mos 03/19/2013, 02/03/2014, 01/29/2015, 02/03/2017   Influenza-Unspecified 02/11/2016, 02/06/2020, 01/30/2021, 01/30/2022   Moderna Sars-Covid-2 Vaccination 07/01/2019, 08/01/2019   Pneumococcal Conjugate-13 03/14/2017   Pneumococcal Polysaccharide-23 07/05/2018   Tdap 09/17/2012   Zoster Recombinant(Shingrix) 11/20/2020,  01/30/2021   Zoster, Live 05/16/2014    TDAP status: Due, Education has been provided regarding the importance of this vaccine. Advised may receive this vaccine at local pharmacy or Health Dept. Aware to provide a copy of the vaccination record if obtained from local pharmacy or Health Dept. Verbalized acceptance and understanding.  Flu Vaccine status: Declined, Education has been provided regarding the importance of this vaccine but patient still declined. Advised may receive this vaccine at local pharmacy or Health Dept. Aware to provide a copy of the vaccination record if obtained from local pharmacy or Health Dept. Verbalized acceptance and understanding.  Pneumococcal vaccine status: Up to date  Covid-19 vaccine status: Completed vaccines  Qualifies for Shingles Vaccine? Yes   Zostavax completed Yes   Shingrix Completed?: Yes  Screening Tests Health Maintenance  Topic Date Due   Diabetic kidney evaluation - Urine ACR  Never done   COVID-19 Vaccine (3 - 2023-24 season) 12/31/2021   DTaP/Tdap/Td (2 - Td or Tdap) 09/18/2022   INFLUENZA VACCINE  12/01/2022   HEMOGLOBIN A1C  12/12/2022   FOOT EXAM  12/15/2022   Colonoscopy  05/06/2023   OPHTHALMOLOGY EXAM  05/27/2023   Diabetic kidney evaluation - eGFR measurement  06/14/2023   Medicare Annual Wellness (AWV)  11/01/2023   Pneumonia Vaccine 96+ Years old  Completed   Hepatitis C Screening  Completed   Zoster Vaccines- Shingrix  Completed   HPV VACCINES  Aged Out    Health Maintenance  Health Maintenance Due  Topic Date Due   Diabetic kidney evaluation - Urine ACR  Never done   COVID-19 Vaccine (3 - 2023-24 season) 12/31/2021   DTaP/Tdap/Td (2 - Td or Tdap) 09/18/2022    Colorectal cancer screening: Type of screening: Colonoscopy. Completed 05/05/2013. Repeat every 10 years  Lung Cancer Screening: (Low Dose CT Chest recommended if Age 22-80 years, 20 pack-year currently smoking OR have quit w/in 15years.) does not qualify.    Lung Cancer Screening Referral: n/a  Additional Screening:  Hepatitis C Screening: does not qualify; Completed 03/04/2015  Vision Screening: Recommended annual ophthalmology exams for early detection of glaucoma and other disorders of the eye. Is the patient up to date with their annual eye exam?  Yes  Who is the provider or what is the name of the office in which the patient attends annual eye exams? Dr. Laural Benes  If pt is not established with a provider, would they like to be referred to a provider to establish care? No .   Dental Screening: Recommended annual dental exams for proper oral hygiene    Community Resource Referral / Chronic Care Management: CRR required this visit?  No   CCM required this visit?  No     Plan:     I  have personally reviewed and noted the following in the patient's chart:   Medical and social history Use of alcohol, tobacco or illicit drugs  Current medications and supplements including opioid prescriptions. Patient is not currently taking opioid prescriptions. Functional ability and status Nutritional status Physical activity Advanced directives List of other physicians Hospitalizations, surgeries, and ER visits in previous 12 months Vitals Screenings to include cognitive, depression, and falls Referrals and appointments  In addition, I have reviewed and discussed with patient certain preventive protocols, quality metrics, and best practice recommendations. A written personalized care plan for preventive services as well as general preventive health recommendations were provided to patient.     Lorrene Reid, LPN   05/07/1094   After Visit Summary: (MyChart) Due to this being a telephonic visit, the after visit summary with patients personalized plan was offered to patient via MyChart   Nurse Notes: none

## 2022-11-04 ENCOUNTER — Other Ambulatory Visit: Payer: Self-pay | Admitting: Nurse Practitioner

## 2022-11-04 NOTE — Telephone Encounter (Signed)
Last office visit 06/16/22 Last refill 07/16/21 #30, 3 refills

## 2022-12-12 ENCOUNTER — Other Ambulatory Visit: Payer: Self-pay

## 2022-12-12 ENCOUNTER — Other Ambulatory Visit: Payer: Medicare HMO

## 2022-12-12 DIAGNOSIS — E781 Pure hyperglyceridemia: Secondary | ICD-10-CM | POA: Diagnosis not present

## 2022-12-12 DIAGNOSIS — I10 Essential (primary) hypertension: Secondary | ICD-10-CM

## 2022-12-12 DIAGNOSIS — E119 Type 2 diabetes mellitus without complications: Secondary | ICD-10-CM

## 2022-12-12 LAB — CBC WITH DIFFERENTIAL/PLATELET
Basophils Absolute: 0.1 10*3/uL (ref 0.0–0.2)
Basos: 1 %
EOS (ABSOLUTE): 0.5 10*3/uL — ABNORMAL HIGH (ref 0.0–0.4)
Eos: 6 %
Hematocrit: 48.2 % (ref 37.5–51.0)
Hemoglobin: 16 g/dL (ref 13.0–17.7)
Immature Grans (Abs): 0 10*3/uL (ref 0.0–0.1)
Immature Granulocytes: 0 %
Lymphocytes Absolute: 1.2 10*3/uL (ref 0.7–3.1)
Lymphs: 17 %
MCH: 29.5 pg (ref 26.6–33.0)
MCHC: 33.2 g/dL (ref 31.5–35.7)
MCV: 89 fL (ref 79–97)
Monocytes Absolute: 0.5 10*3/uL (ref 0.1–0.9)
Monocytes: 6 %
Neutrophils Absolute: 5.2 10*3/uL (ref 1.4–7.0)
Neutrophils: 70 %
Platelets: 244 10*3/uL (ref 150–450)
RBC: 5.43 x10E6/uL (ref 4.14–5.80)
RDW: 12.9 % (ref 11.6–15.4)
WBC: 7.5 10*3/uL (ref 3.4–10.8)

## 2022-12-12 LAB — CMP14+EGFR
ALT: 12 IU/L (ref 0–44)
AST: 14 IU/L (ref 0–40)
Albumin: 4.6 g/dL (ref 3.9–4.9)
Alkaline Phosphatase: 50 IU/L (ref 44–121)
BUN/Creatinine Ratio: 5 — ABNORMAL LOW (ref 10–24)
BUN: 6 mg/dL — ABNORMAL LOW (ref 8–27)
Bilirubin Total: 0.6 mg/dL (ref 0.0–1.2)
CO2: 26 mmol/L (ref 20–29)
Calcium: 10 mg/dL (ref 8.6–10.2)
Chloride: 99 mmol/L (ref 96–106)
Creatinine, Ser: 1.11 mg/dL (ref 0.76–1.27)
Globulin, Total: 2.3 g/dL (ref 1.5–4.5)
Glucose: 114 mg/dL — ABNORMAL HIGH (ref 70–99)
Potassium: 4.1 mmol/L (ref 3.5–5.2)
Sodium: 138 mmol/L (ref 134–144)
Total Protein: 6.9 g/dL (ref 6.0–8.5)
eGFR: 71 mL/min/{1.73_m2} (ref >59)

## 2022-12-12 LAB — LIPID PANEL
Chol/HDL Ratio: 4.1 ratio (ref 0.0–5.0)
Cholesterol, Total: 146 mg/dL (ref 100–199)
HDL: 36 mg/dL — ABNORMAL LOW (ref >39)
LDL Chol Calc (NIH): 85 mg/dL (ref 0–99)
Triglycerides: 141 mg/dL (ref 0–149)
VLDL Cholesterol Cal: 25 mg/dL (ref 5–40)

## 2022-12-12 LAB — BAYER DCA HB A1C WAIVED: HB A1C (BAYER DCA - WAIVED): 5.2 % (ref 4.8–5.6)

## 2022-12-15 ENCOUNTER — Encounter: Payer: Self-pay | Admitting: Nurse Practitioner

## 2022-12-15 ENCOUNTER — Ambulatory Visit (INDEPENDENT_AMBULATORY_CARE_PROVIDER_SITE_OTHER): Payer: Medicare HMO | Admitting: Nurse Practitioner

## 2022-12-15 VITALS — BP 133/77 | HR 63 | Temp 97.5°F | Resp 20 | Ht 68.0 in | Wt 199.0 lb

## 2022-12-15 DIAGNOSIS — E785 Hyperlipidemia, unspecified: Secondary | ICD-10-CM | POA: Diagnosis not present

## 2022-12-15 DIAGNOSIS — I6930 Unspecified sequelae of cerebral infarction: Secondary | ICD-10-CM

## 2022-12-15 DIAGNOSIS — K219 Gastro-esophageal reflux disease without esophagitis: Secondary | ICD-10-CM

## 2022-12-15 DIAGNOSIS — I10 Essential (primary) hypertension: Secondary | ICD-10-CM | POA: Diagnosis not present

## 2022-12-15 DIAGNOSIS — M25562 Pain in left knee: Secondary | ICD-10-CM

## 2022-12-15 DIAGNOSIS — G63 Polyneuropathy in diseases classified elsewhere: Secondary | ICD-10-CM

## 2022-12-15 DIAGNOSIS — E1169 Type 2 diabetes mellitus with other specified complication: Secondary | ICD-10-CM | POA: Diagnosis not present

## 2022-12-15 DIAGNOSIS — Z7984 Long term (current) use of oral hypoglycemic drugs: Secondary | ICD-10-CM | POA: Diagnosis not present

## 2022-12-15 DIAGNOSIS — E119 Type 2 diabetes mellitus without complications: Secondary | ICD-10-CM

## 2022-12-15 DIAGNOSIS — Z6833 Body mass index (BMI) 33.0-33.9, adult: Secondary | ICD-10-CM | POA: Diagnosis not present

## 2022-12-15 DIAGNOSIS — M1A0710 Idiopathic chronic gout, right ankle and foot, without tophus (tophi): Secondary | ICD-10-CM | POA: Diagnosis not present

## 2022-12-15 MED ORDER — OMEPRAZOLE 40 MG PO CPDR
40.0000 mg | DELAYED_RELEASE_CAPSULE | Freq: Every day | ORAL | 1 refills | Status: DC
Start: 2022-12-15 — End: 2023-06-16

## 2022-12-15 MED ORDER — DULOXETINE HCL 60 MG PO CPEP
60.0000 mg | ORAL_CAPSULE | Freq: Every day | ORAL | 1 refills | Status: DC
Start: 2022-12-15 — End: 2023-06-16

## 2022-12-15 MED ORDER — PRAVASTATIN SODIUM 80 MG PO TABS
80.0000 mg | ORAL_TABLET | Freq: Every day | ORAL | 1 refills | Status: DC
Start: 2022-12-15 — End: 2023-06-16

## 2022-12-15 MED ORDER — CLOPIDOGREL BISULFATE 75 MG PO TABS
75.0000 mg | ORAL_TABLET | Freq: Every day | ORAL | 1 refills | Status: DC
Start: 2022-12-15 — End: 2023-06-16

## 2022-12-15 MED ORDER — GABAPENTIN 100 MG PO CAPS
200.0000 mg | ORAL_CAPSULE | Freq: Two times a day (BID) | ORAL | 1 refills | Status: DC
Start: 2022-12-15 — End: 2023-06-16

## 2022-12-15 MED ORDER — LISINOPRIL-HYDROCHLOROTHIAZIDE 20-25 MG PO TABS
1.0000 | ORAL_TABLET | Freq: Every day | ORAL | 1 refills | Status: DC
Start: 2022-12-15 — End: 2023-06-16

## 2022-12-15 NOTE — Progress Notes (Signed)
Subjective:    Patient ID: David Garza, male    DOB: [birth year], 72 y.o.   MRN: [id number]   Chief Complaint: medical management of chronic issues     HPI:  David Garza is a 72 y.o. who identifies as a male who was assigned male at birth.   Social history: Lives with: wife on farm Work history: farmer   Comes in today for follow up of the following chronic medical issues:  1. Primary hypertension No c/o chest pain, sob or headache. Does not check blood pressure at home. BP Readings from Last 3 Encounters:  08/26/22 136/85  06/16/22 135/82  12/14/21 134/77     2. 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 Diabetes mellitus treated with oral medication (HCC) Fasting blood sugars are running around 120-140. No low blood sugars. Lab Results  Component Value Date   HGBA1C 5.2 12/12/2022     4. Neuropathy due to medical condition [facility]) Mainly in right hand since his stroke. Just feels numb all the time.  5. Late effect of cerebrovascular accident (CVA) No permanent effects  6. Gastroesophageal reflux disease without esophagitis Is on omperozole daily and is doing well.  7. Idiopathic chronic gout of right foot without tophus No recent gout flare ups  8. BMI 33.0-33.9,adult Weight is down 10lbs Wt Readings from Last 3 Encounters:  12/15/22 199 lb (90.3 kg)  11/01/22 209 lb (94.8 kg)  08/26/22 208 lb 9.6 oz (94.6 kg)   BMI Readings from Last 3 Encounters:  12/15/22 30.26 kg/m  11/01/22 31.78 kg/m  08/26/22 31.72 kg/m      New complaints: Left knee pain. Started 3-4 months ago. Worse when walking or standing. Rates pain 7/10. He has not taking anything OTC for it.  Allergies  Allergen Reactions    Meloxicam Hypertension   Nsaids Hypertension   Outpatient Encounter Medications as of 12/15/2022  Medication Sig   clopidogrel (PLAVIX) 75 MG tablet Take 1 tablet (75 mg total) by mouth daily.   colchicine 0.6 MG tablet TAKE 2 TABLETS BY MOUTH FOR 1 DOSE, THEN 1 TABLET ONE HOUR LATER   cyclobenzaprine (FLEXERIL) 10 MG tablet Take 1 tablet (10 mg total) by mouth 3 (three) times daily as needed for muscle spasms.   DULoxetine (CYMBALTA) 60 MG capsule Take 1 capsule (60 mg total) by mouth daily.   fish oil-omega-3 fatty acids 1000 MG capsule Take 2 g by mouth daily.   gabapentin (NEURONTIN) 100 MG capsule Take 2 capsules (200 mg total) by mouth 2 (two) times daily.   lisinopril-hydrochlorothiazide (ZESTORETIC) 20-25 MG tablet Take 1 tablet by mouth daily.   meclizine (ANTIVERT) 12.5 MG tablet Take 1 tablet (12.5 mg total) by mouth 3 (three) times daily as needed for dizziness.   omeprazole (PRILOSEC) 40 MG capsule Take 1 capsule (40 mg total) by mouth daily.   pravastatin (PRAVACHOL) 80 MG tablet Take 1 tablet (80 mg total) by mouth daily.   No facility-administered encounter medications on file as of 12/15/2022.    Past Surgical History:  Procedure Laterality Date   KNEE ARTHROSCOPY Right    SHOULDER SURGERY Right     Family History  Problem Relation Age of  Onset   Cancer Mother        pancreatic   Alzheimer's disease Mother 36   Diabetes Sister    Aneurysm Sister        questionable - possible heart attack    Coronary artery disease Maternal Grandfather    Alcohol abuse Maternal Grandfather    GI Disease Maternal Grandfather    Early death Father    Alzheimer's disease Paternal Uncle 57   Aneurysm Paternal Uncle    Cancer Sister        male      Controlled substance contract: n/a     Review of Systems  Constitutional:  Negative for diaphoresis.  Eyes:  Negative for pain.  Respiratory:  Negative for shortness of breath.   Cardiovascular:  Negative for chest pain,  palpitations and leg swelling.  Gastrointestinal:  Negative for abdominal pain.  Endocrine: Negative for polydipsia.  Skin:  Negative for rash.  Neurological:  Negative for dizziness, weakness and headaches.  Hematological:  Does not bruise/bleed easily.  All other systems reviewed and are negative.      Objective:   Physical Exam Vitals and nursing note reviewed.  Constitutional:      Appearance: Normal appearance. He is well-developed.  HENT:     Head: Normocephalic.     Nose: Nose normal.     Mouth/Throat:     Mouth: Mucous membranes are moist.     Pharynx: Oropharynx is clear.  Eyes:     Pupils: Pupils are equal, round, and reactive to light.  Neck:     Thyroid: No thyroid mass or thyromegaly.     Vascular: No carotid bruit or JVD.     Trachea: Phonation normal.  Cardiovascular:     Rate and Rhythm: Normal rate and regular rhythm.  Pulmonary:     Effort: Pulmonary effort is normal. No respiratory distress.     Breath sounds: Normal breath sounds.  Abdominal:     General: Bowel sounds are normal.     Palpations: Abdomen is soft.     Tenderness: There is no abdominal tenderness.  Musculoskeletal:        General: Normal range of motion.     Cervical back: Normal range of motion and neck supple.  Lymphadenopathy:     Cervical: No cervical adenopathy.  Skin:    General: Skin is warm and dry.  Neurological:     Mental Status: He is alert and oriented to person, place, and time.  Psychiatric:        Behavior: Behavior normal.        Thought Content: Thought content normal.        Judgment: Judgment normal.     BP 133/77   Pulse 63   Temp (!) 97.5 F (36.4 C) (Temporal)   Resp 20   Ht 5\' 8"  (1.727 m)   Wt 199 lb (90.3 kg)   SpO2 95%   BMI 30.26 kg/m        Assessment & Plan:   David Garza comes in today with chief complaint of Medical Management of Chronic Issues   Diagnosis and orders addressed:  1. Primary hypertension Low sodium diet -  lisinopril-hydrochlorothiazide (ZESTORETIC) 20-25 MG tablet; Take 1 tablet by mouth daily.  Dispense: 90 tablet; Refill: 1  2. Hyperlipidemia associated with type 2 diabetes mellitus (HCC) Low fat diet - pravastatin (PRAVACHOL) 80 MG tablet; Take 1 tablet (80 mg total) by mouth daily.  Dispense: 90 tablet; Refill: 1  3. Diabetes mellitus treated with oral medication (HCC) Continue to watch carbs in diet  4. Neuropathy due to medical condition (HCC) - DULoxetine (CYMBALTA) 60 MG capsule; Take 1 capsule (60 mg total) by mouth daily.  Dispense: 90 capsule; Refill: 1 - gabapentin (NEURONTIN) 100 MG capsule; Take 2 capsules (200 mg total) by mouth 2 (two) times daily.  Dispense: 120 capsule; Refill: 1  5. Late effect of cerebrovascular accident (CVA) - clopidogrel (PLAVIX) 75 MG tablet; Take 1 tablet (75 mg total) by mouth daily.  Dispense: 90 tablet; Refill: 1  6. Gastroesophageal reflux disease without esophagitis Avoid spicy foods Do not eat 2 hours prior to bedtime  - omeprazole (PRILOSEC) 40 MG capsule; Take 1 capsule (40 mg total) by mouth daily.  Dispense: 90 capsule; Refill: 1  7. Idiopathic chronic gout of right foot without tophus   8. BMI 33.0-33.9,adult Discussed diet and exercise for person with BMI >25 Will recheck weight in 3-6 months    Labs pending Health Maintenance reviewed Diet and exercise encouraged  Follow up plan: 6 months   Mary-Margaret Daphine Deutscher, FNP

## 2022-12-15 NOTE — Patient Instructions (Signed)
Acute Knee Pain, Adult Many things can cause knee pain. Sometimes, knee pain is sudden (acute) and may be caused by damage, swelling, or irritation of the muscles and tissues that support your knee. The pain often goes away on its own with time and rest. If the pain does not go away, tests may be done to find out what is causing the pain. Follow these instructions at home: If you have a knee sleeve or brace:  Wear the knee sleeve or brace as told by your doctor. Take it off only as told by your doctor. Loosen it if your toes: Tingle. Become numb. Turn cold and blue. Keep it clean. If the knee sleeve or brace is not waterproof: Do not let it get wet. Cover it with a watertight covering when you take a bath or shower. Activity Rest your knee. Do not do things that cause pain or make pain worse. Avoid activities where both feet leave the ground at the same time (high-impact activities). Examples are running, jumping rope, and doing jumping jacks. Work with a physical therapist to make a safe exercise program, as told by your doctor. Managing pain, stiffness, and swelling  If told, put ice on the knee. To do this: If you have a removable knee sleeve or brace, take it off as told by your doctor. Put ice in a plastic bag. Place a towel between your skin and the bag. Leave the ice on for 20 minutes, 2-3 times a day. Take off the ice if your skin turns bright red. This is very important. If you cannot feel pain, heat, or cold, you have a greater risk of damage to the area. If told, use an elastic bandage to put pressure (compression) on your injured knee. Raise your knee above the level of your heart while you are sitting or lying down. Sleep with a pillow under your knee. General instructions Take over-the-counter and prescription medicines only as told by your doctor. Do not smoke or use any products that contain nicotine or tobacco. If you need help quitting, ask your doctor. If you are  overweight, work with your doctor and a food expert (dietitian) to set goals to lose weight. Being overweight can make your knee hurt more. Watch for any changes in your symptoms. Keep all follow-up visits. Contact a doctor if: The knee pain does not stop. The knee pain changes or gets worse. You have a fever along with knee pain. Your knee is red or feels warm when you touch it. Your knee gives out or locks up. Get help right away if: Your knee swells, and the swelling gets worse. You cannot move your knee. You have very bad knee pain that does not get better with pain medicine. Summary Many things can cause knee pain. The pain often goes away on its own with time and rest. Your doctor may do tests to find out the cause of the pain. Watch for any changes in your symptoms. Relieve your pain with rest, medicines, light activity, and use of ice. Get help right away if you cannot move your knee or your knee pain is very bad. This information is not intended to replace advice given to you by your health care provider. Make sure you discuss any questions you have with your health care provider. Document Revised: 10/01/2019 Document Reviewed: 10/02/2019 Elsevier Patient Education  2024 [employer]  

## 2022-12-28 ENCOUNTER — Other Ambulatory Visit: Payer: Self-pay

## 2022-12-28 MED ORDER — COLCHICINE 0.6 MG PO TABS: ORAL_TABLET | ORAL | 1 refills | Status: AC

## 2023-02-20 DIAGNOSIS — M170 Bilateral primary osteoarthritis of knee: Secondary | ICD-10-CM | POA: Diagnosis not present

## 2023-03-06 DIAGNOSIS — M170 Bilateral primary osteoarthritis of knee: Secondary | ICD-10-CM | POA: Diagnosis not present

## 2023-03-13 DIAGNOSIS — M170 Bilateral primary osteoarthritis of knee: Secondary | ICD-10-CM | POA: Diagnosis not present

## 2023-03-22 DIAGNOSIS — M170 Bilateral primary osteoarthritis of knee: Secondary | ICD-10-CM | POA: Diagnosis not present

## 2023-04-14 DIAGNOSIS — C44619 Basal cell carcinoma of skin of left upper limb, including shoulder: Secondary | ICD-10-CM | POA: Diagnosis not present

## 2023-04-14 DIAGNOSIS — Z85828 Personal history of other malignant neoplasm of skin: Secondary | ICD-10-CM | POA: Diagnosis not present

## 2023-04-14 DIAGNOSIS — L570 Actinic keratosis: Secondary | ICD-10-CM | POA: Diagnosis not present

## 2023-04-14 DIAGNOSIS — C44519 Basal cell carcinoma of skin of other part of trunk: Secondary | ICD-10-CM | POA: Diagnosis not present

## 2023-04-14 DIAGNOSIS — D485 Neoplasm of uncertain behavior of skin: Secondary | ICD-10-CM | POA: Diagnosis not present

## 2023-04-14 DIAGNOSIS — D2262 Melanocytic nevi of left upper limb, including shoulder: Secondary | ICD-10-CM | POA: Diagnosis not present

## 2023-05-02 DIAGNOSIS — M170 Bilateral primary osteoarthritis of knee: Secondary | ICD-10-CM | POA: Diagnosis not present

## 2023-06-09 ENCOUNTER — Other Ambulatory Visit: Payer: Self-pay

## 2023-06-09 DIAGNOSIS — I10 Essential (primary) hypertension: Secondary | ICD-10-CM

## 2023-06-09 DIAGNOSIS — E119 Type 2 diabetes mellitus without complications: Secondary | ICD-10-CM

## 2023-06-09 DIAGNOSIS — Z125 Encounter for screening for malignant neoplasm of prostate: Secondary | ICD-10-CM

## 2023-06-09 DIAGNOSIS — E1169 Type 2 diabetes mellitus with other specified complication: Secondary | ICD-10-CM

## 2023-06-14 ENCOUNTER — Other Ambulatory Visit: Payer: Medicare HMO

## 2023-06-14 DIAGNOSIS — E1169 Type 2 diabetes mellitus with other specified complication: Secondary | ICD-10-CM

## 2023-06-14 DIAGNOSIS — Z125 Encounter for screening for malignant neoplasm of prostate: Secondary | ICD-10-CM

## 2023-06-14 DIAGNOSIS — E119 Type 2 diabetes mellitus without complications: Secondary | ICD-10-CM | POA: Diagnosis not present

## 2023-06-14 DIAGNOSIS — Z7984 Long term (current) use of oral hypoglycemic drugs: Secondary | ICD-10-CM | POA: Diagnosis not present

## 2023-06-14 DIAGNOSIS — I10 Essential (primary) hypertension: Secondary | ICD-10-CM

## 2023-06-14 LAB — BAYER DCA HB A1C WAIVED: HB A1C (BAYER DCA - WAIVED): 5 % (ref 4.8–5.6)

## 2023-06-15 LAB — CMP14+EGFR
ALT: 21 [IU]/L (ref 0–44)
AST: 16 [IU]/L (ref 0–40)
Albumin: 4.5 g/dL (ref 3.8–4.8)
Alkaline Phosphatase: 62 [IU]/L (ref 44–121)
BUN/Creatinine Ratio: 13 (ref 10–24)
BUN: 15 mg/dL (ref 8–27)
Bilirubin Total: 0.8 mg/dL (ref 0.0–1.2)
CO2: 23 mmol/L (ref 20–29)
Calcium: 9.4 mg/dL (ref 8.6–10.2)
Chloride: 96 mmol/L (ref 96–106)
Creatinine, Ser: 1.12 mg/dL (ref 0.76–1.27)
Globulin, Total: 2.3 g/dL (ref 1.5–4.5)
Glucose: 86 mg/dL (ref 70–99)
Potassium: 3.8 mmol/L (ref 3.5–5.2)
Sodium: 135 mmol/L (ref 134–144)
Total Protein: 6.8 g/dL (ref 6.0–8.5)
eGFR: 70 mL/min/{1.73_m2} (ref >59)

## 2023-06-15 LAB — CBC WITH DIFFERENTIAL/PLATELET
Basophils Absolute: 0.1 10*3/uL (ref 0.0–0.2)
Basos: 0 %
EOS (ABSOLUTE): 0.2 10*3/uL (ref 0.0–0.4)
Eos: 2 %
Hematocrit: 43 % (ref 37.5–51.0)
Hemoglobin: 15 g/dL (ref 13.0–17.7)
Immature Grans (Abs): 0 10*3/uL (ref 0.0–0.1)
Immature Granulocytes: 0 %
Lymphocytes Absolute: 1.1 10*3/uL (ref 0.7–3.1)
Lymphs: 9 %
MCH: 30.9 pg (ref 26.6–33.0)
MCHC: 34.9 g/dL (ref 31.5–35.7)
MCV: 89 fL (ref 79–97)
Monocytes Absolute: 0.8 10*3/uL (ref 0.1–0.9)
Monocytes: 7 %
Neutrophils Absolute: 9.9 10*3/uL — ABNORMAL HIGH (ref 1.4–7.0)
Neutrophils: 82 %
Platelets: 229 10*3/uL (ref 150–450)
RBC: 4.85 x10E6/uL (ref 4.14–5.80)
RDW: 12.2 % (ref 11.6–15.4)
WBC: 12.1 10*3/uL — ABNORMAL HIGH (ref 3.4–10.8)

## 2023-06-15 LAB — PSA, TOTAL AND FREE
PSA, Free Pct: 60 %
PSA, Free: 0.42 ng/mL
Prostate Specific Ag, Serum: 0.7 ng/mL (ref 0.0–4.0)

## 2023-06-15 LAB — LIPID PANEL
Chol/HDL Ratio: 4.8 {ratio} (ref 0.0–5.0)
Cholesterol, Total: 145 mg/dL (ref 100–199)
HDL: 30 mg/dL — ABNORMAL LOW (ref >39)
LDL Chol Calc (NIH): 90 mg/dL (ref 0–99)
Triglycerides: 139 mg/dL (ref 0–149)
VLDL Cholesterol Cal: 25 mg/dL (ref 5–40)

## 2023-06-16 ENCOUNTER — Encounter: Payer: Self-pay | Admitting: Nurse Practitioner

## 2023-06-16 ENCOUNTER — Ambulatory Visit: Payer: Medicare HMO | Admitting: Nurse Practitioner

## 2023-06-16 VITALS — BP 128/76 | HR 83 | Temp 99.5°F | Ht 68.0 in | Wt 196.8 lb

## 2023-06-16 DIAGNOSIS — G63 Polyneuropathy in diseases classified elsewhere: Secondary | ICD-10-CM | POA: Diagnosis not present

## 2023-06-16 DIAGNOSIS — I10 Essential (primary) hypertension: Secondary | ICD-10-CM

## 2023-06-16 DIAGNOSIS — M1A0710 Idiopathic chronic gout, right ankle and foot, without tophus (tophi): Secondary | ICD-10-CM | POA: Diagnosis not present

## 2023-06-16 DIAGNOSIS — I6930 Unspecified sequelae of cerebral infarction: Secondary | ICD-10-CM

## 2023-06-16 DIAGNOSIS — Z7984 Long term (current) use of oral hypoglycemic drugs: Secondary | ICD-10-CM | POA: Diagnosis not present

## 2023-06-16 DIAGNOSIS — E119 Type 2 diabetes mellitus without complications: Secondary | ICD-10-CM

## 2023-06-16 DIAGNOSIS — E785 Hyperlipidemia, unspecified: Secondary | ICD-10-CM | POA: Diagnosis not present

## 2023-06-16 DIAGNOSIS — Z6833 Body mass index (BMI) 33.0-33.9, adult: Secondary | ICD-10-CM | POA: Diagnosis not present

## 2023-06-16 DIAGNOSIS — E1169 Type 2 diabetes mellitus with other specified complication: Secondary | ICD-10-CM | POA: Diagnosis not present

## 2023-06-16 DIAGNOSIS — K219 Gastro-esophageal reflux disease without esophagitis: Secondary | ICD-10-CM | POA: Diagnosis not present

## 2023-06-16 MED ORDER — LISINOPRIL-HYDROCHLOROTHIAZIDE 20-25 MG PO TABS: 1.0000 | ORAL_TABLET | Freq: Every day | ORAL | 1 refills | Status: DC

## 2023-06-16 MED ORDER — GABAPENTIN 100 MG PO CAPS: 200.0000 mg | ORAL_CAPSULE | Freq: Two times a day (BID) | ORAL | 1 refills | Status: DC

## 2023-06-16 MED ORDER — OMEPRAZOLE 40 MG PO CPDR: 40.0000 mg | DELAYED_RELEASE_CAPSULE | Freq: Every day | ORAL | 1 refills | Status: DC

## 2023-06-16 MED ORDER — CLOPIDOGREL BISULFATE 75 MG PO TABS: 75.0000 mg | ORAL_TABLET | Freq: Every day | ORAL | 1 refills | Status: DC

## 2023-06-16 MED ORDER — DULOXETINE HCL 60 MG PO CPEP: 60.0000 mg | ORAL_CAPSULE | Freq: Every day | ORAL | 1 refills | Status: DC

## 2023-06-16 MED ORDER — PRAVASTATIN SODIUM 80 MG PO TABS: 80.0000 mg | ORAL_TABLET | Freq: Every day | ORAL | 1 refills | Status: DC

## 2023-06-16 NOTE — Patient Instructions (Signed)
GERD in Adults: Diet Changes When you have gastroesophageal reflux disease (GERD), you may need to make changes to your diet. Choosing the right foods can help with your symptoms. Think about working with an expert in healthy eating called a dietitian. They can help you make healthy food choices. What are tips for following this plan? Reading food labels Look for foods that are low in saturated fat. Foods that may help with your symptoms include: Foods with less than 5% of daily value (DV) of fat. Foods with 0 grams of trans fat. Cooking [employer] in ways that don't use a lot of fat. These ways include: Baking. Steaming. Grilling. Broiling. To add flavor, try to use herbs that are low in spice and acidity. Avoid frying your food. Meal planning  Eat small meals often rather than eating 3 large meals each day. Eat your meals slowly in a place where you feel relaxed. If told by your health care provider, avoid: Foods that cause symptoms. Keep a food diary to keep track of foods that cause symptoms. Alcohol. Drinking a lot of liquid with meals. General instructions For 2-3 hours after you eat, avoid: Bending over. Exercise. Lying down. Chew sugar-free gum after meals. What foods should I eat? Eat a healthy diet. Try to include: Foods with high amounts of fiber. These include: Fruits and vegetables. Whole grains and beans. Low-fat dairy products. Lean meats, fish, and poultry. Egg whites. Foods that cause symptoms in someone else may not cause symptoms for you. Work with your provider to find foods that are safe for you. The items listed above may not be all the foods and drinks you can have. Talk with a dietitian to learn more. The items listed above may not be a complete list of foods and beverages you can eat and drink. Contact a dietitian for more information. What foods should I avoid? Limiting some of these foods may help with your symptoms. Each person is different.  Talk with a dietitian or your provider to help you find the exact foods to avoid. Some of the foods to avoid may include: Fruits Fruits with a lot of acid in them. These may include citrus fruits, such as oranges, grapefruit, pineapple, and lemons. Vegetables Deep-fried vegetables, such as [place] fries. Vegetables, sauces, or toppings made with added fat and vegetables with acid in them. These may include tomatoes and tomato products, chili peppers, onions, garlic, and horseradish. Grains Pastries or quick breads with added fat. Meats and other proteins High-fat meats, such as fatty beef or pork, hot dogs, ribs, ham, sausage, salami, and bacon. Fried meat or protein, such as fried fish and fried chicken. Egg yolks. Fats and oils Butter. Margarine. Shortening. Ghee. Drinks Coffee and other drinks with caffeine in them. Fizzy and sugary drinks, such as soda and energy drinks. Fruit juice made with acidic fruits, such as orange or grapefruit. Tomato juice. Sweets and desserts Chocolate and cocoa. Donuts. Seasonings and condiments Mint, such as peppermint and spearmint. Condiments, herbs, or seasonings that cause symptoms. These may include curry, hot sauce, or vinegar-based salad dressings. The items listed above may not be all the foods and drinks you should avoid. Talk with a dietitian to learn more. Questions to ask your health care provider Changes to your diet and everyday life are often the first steps taken to manage symptoms of GERD. If these changes don't help, talk with your provider about taking medicines. Where to find more information [employer]:  aboutgerd.org This information is not intended to replace advice given to you by your health care provider. Make sure you discuss any questions you have with your health care provider. Document Revised: 02/28/2023 Document Reviewed: 09/14/2022 Elsevier Patient Education  2024 [employer].

## 2023-06-16 NOTE — Progress Notes (Signed)
Subjective:    Patient ID: David Garza, male    DOB: 12/01/1950, 73 y.o.   MRN: [id number]   Chief Complaint: medical management of chronic issues     HPI:  David Garza is a 73 y.o. who identifies as a male who was assigned male at birth.   Social history: Lives with: wife on farm Work history: farmer   Comes in today for follow up of the following chronic medical issues:  1. Primary hypertension No c/o chest pain, sob or headache. Does not check blood pressure at home. BP Readings from Last 3 Encounters:  12/15/22 133/77  08/26/22 136/85  06/16/22 135/82     2. Hyperlipidemia associated with type 2 diabetes mellitus (HCC) Does try to watch diet and stays very active on his farm Lab Results  Component Value Date   CHOL 145 06/14/2023   HDL 30 (L) 06/14/2023   LDLCALC 90 06/14/2023   TRIG 139 06/14/2023   CHOLHDL 4.8 06/14/2023     3. Diabetes mellitus treated with oral medication [facility]) He has not  been checking his blood sugars at home. No low blood sugars. Lab Results  Component Value Date   HGBA1C 5.0 06/14/2023     4. Neuropathy due to medical condition [facility]) Mainly in right hand since his stroke. Just feels numb all the time.  5. Late effect of cerebrovascular accident (CVA) Only issue he is having is mild right sided weakness.  6. Gastroesophageal reflux disease without esophagitis Is on omperozole daily and is doing well.  7. Idiopathic chronic gout of right foot without tophus No recent gout flare ups  8. BMI 33.0-33.9,adult Weight is down 3lbs  Wt Readings from Last 3 Encounters:  06/16/23 196 lb 12.8 oz (89.3 kg)  12/15/22 199 lb (90.3 kg)  11/01/22 209 lb (94.8 kg)   BMI Readings from Last 3 Encounters:  06/16/23 29.92 kg/m  12/15/22 30.26 kg/m  11/01/22 31.78 kg/m         New complaints: None   Allergies  Allergen Reactions   Meloxicam Hypertension   Nsaids Hypertension   Outpatient Encounter  Medications as of 06/16/2023  Medication Sig   clopidogrel (PLAVIX) 75 MG tablet Take 1 tablet (75 mg total) by mouth daily.   colchicine 0.6 MG tablet TAKE 2 TABLETS BY MOUTH FOR 1 DOSE, THEN 1 TABLET ONE HOUR LATER   cyclobenzaprine (FLEXERIL) 10 MG tablet Take 1 tablet (10 mg total) by mouth 3 (three) times daily as needed for muscle spasms.   DULoxetine (CYMBALTA) 60 MG capsule Take 1 capsule (60 mg total) by mouth daily.   fish oil-omega-3 fatty acids 1000 MG capsule Take 2 g by mouth daily.   gabapentin (NEURONTIN) 100 MG capsule Take 2 capsules (200 mg total) by mouth 2 (two) times daily.   lisinopril-hydrochlorothiazide (ZESTORETIC) 20-25 MG tablet Take 1 tablet by mouth daily.   meclizine (ANTIVERT) 12.5 MG tablet Take 1 tablet (12.5 mg total) by mouth 3 (three) times daily as needed for dizziness.   omeprazole (PRILOSEC) 40 MG capsule Take 1 capsule (40 mg total) by mouth daily.   pravastatin (PRAVACHOL) 80 MG tablet Take 1 tablet (80 mg total) by mouth daily.   No facility-administered encounter medications on file as of 06/16/2023.    Past Surgical History:  Procedure Laterality Date   KNEE ARTHROSCOPY Right    SHOULDER SURGERY Right     Family History  Problem Relation Age of Onset   Cancer Mother  pancreatic   Alzheimer's disease Mother 37   Diabetes Sister    Aneurysm Sister        questionable - possible heart attack    Coronary artery disease Maternal Grandfather    Alcohol abuse Maternal Grandfather    GI Disease Maternal Grandfather    Early death Father    Alzheimer's disease Paternal Uncle 24   Aneurysm Paternal Uncle    Cancer Sister        male      Controlled substance contract: n/a     Review of Systems  Constitutional:  Negative for diaphoresis.  Eyes:  Negative for pain.  Respiratory:  Negative for shortness of breath.   Cardiovascular:  Negative for chest pain, palpitations and leg swelling.  Gastrointestinal:  Negative for  abdominal pain.  Endocrine: Negative for polydipsia.  Skin:  Negative for rash.  Neurological:  Negative for dizziness, weakness and headaches.  Hematological:  Does not bruise/bleed easily.  All other systems reviewed and are negative.      Objective:   Physical Exam Vitals and nursing note reviewed.  Constitutional:      Appearance: Normal appearance. He is well-developed.  HENT:     Head: Normocephalic.     Nose: Nose normal.     Mouth/Throat:     Mouth: Mucous membranes are moist.     Pharynx: Oropharynx is clear.  Eyes:     Pupils: Pupils are equal, round, and reactive to light.  Neck:     Thyroid: No thyroid mass or thyromegaly.     Vascular: No carotid bruit or JVD.     Trachea: Phonation normal.  Cardiovascular:     Rate and Rhythm: Normal rate and regular rhythm.  Pulmonary:     Effort: Pulmonary effort is normal. No respiratory distress.     Breath sounds: Normal breath sounds.  Abdominal:     General: Bowel sounds are normal.     Palpations: Abdomen is soft.     Tenderness: There is no abdominal tenderness.  Musculoskeletal:        General: Normal range of motion.     Cervical back: Normal range of motion and neck supple.  Lymphadenopathy:     Cervical: No cervical adenopathy.  Skin:    General: Skin is warm and dry.  Neurological:     Mental Status: He is alert and oriented to person, place, and time.  Psychiatric:        Behavior: Behavior normal.        Thought Content: Thought content normal.        Judgment: Judgment normal.     BP 128/76   Pulse 83   Temp 99.5 F (37.5 C)   Ht 5\' 8"  (1.727 m)   Wt 196 lb 12.8 oz (89.3 kg)   SpO2 95%   BMI 29.92 kg/m         Assessment & Plan:   David Garza comes in today with chief complaint of medical management of chronic issues    Diagnosis and orders addressed:  1. Primary hypertension Low sodium diet - lisinopril-hydrochlorothiazide (ZESTORETIC) 20-25 MG tablet; Take 1 tablet by  mouth daily.  Dispense: 90 tablet; Refill: 1  2. Hyperlipidemia associated with type 2 diabetes mellitus (HCC) Low fat diet - pravastatin (PRAVACHOL) 80 MG tablet; Take 1 tablet (80 mg total) by mouth daily.  Dispense: 90 tablet; Refill: 1  3. Diabetes mellitus treated with oral medication (HCC) Continue to watch carbs in diet  4. Neuropathy due to medical condition (HCC) - DULoxetine (CYMBALTA) 60 MG capsule; Take 1 capsule (60 mg total) by mouth daily.  Dispense: 90 capsule; Refill: 1 - gabapentin (NEURONTIN) 100 MG capsule; Take 2 capsules (200 mg total) by mouth 2 (two) times daily.  Dispense: 120 capsule; Refill: 1  5. Late effect of cerebrovascular accident (CVA) - clopidogrel (PLAVIX) 75 MG tablet; Take 1 tablet (75 mg total) by mouth daily.  Dispense: 90 tablet; Refill: 1  6. Gastroesophageal reflux disease without esophagitis Avoid spicy foods Do not eat 2 hours prior to bedtime Make sure does mot take omperazole with plavix- do one in morning and the other in the evening - omeprazole (PRILOSEC) 40 MG capsule; Take 1 capsule (40 mg total) by mouth daily.  Dispense: 90 capsule; Refill: 1  7. Idiopathic chronic gout of right foot without tophus   8. BMI 33.0-33.9,adult Discussed diet and exercise for person with BMI >25 Will recheck weight in 3-6 months    Labs pending Health Maintenance reviewed Diet and exercise encouraged  Follow up plan: 6 months   Mary-Margaret Daphine Deutscher, FNP

## 2023-10-09 ENCOUNTER — Other Ambulatory Visit: Payer: Self-pay

## 2023-10-09 DIAGNOSIS — R42 Dizziness and giddiness: Secondary | ICD-10-CM

## 2023-10-09 MED ORDER — MECLIZINE HCL 12.5 MG PO TABS
12.5000 mg | ORAL_TABLET | Freq: Three times a day (TID) | ORAL | 2 refills | Status: AC | PRN
Start: 2023-10-09 — End: ?

## 2023-10-16 ENCOUNTER — Telehealth: Payer: Self-pay | Admitting: Nurse Practitioner

## 2023-10-18 ENCOUNTER — Telehealth: Payer: Self-pay | Admitting: Nurse Practitioner

## 2023-10-18 NOTE — Telephone Encounter (Signed)
 Called to offer the patient a diabetic eye exam if he hadn't had one in the last year.

## 2023-11-02 ENCOUNTER — Ambulatory Visit: Payer: Medicare HMO

## 2023-11-02 VITALS — BP 128/76 | HR 83 | Ht 68.0 in | Wt 196.0 lb

## 2023-11-02 DIAGNOSIS — Z1211 Encounter for screening for malignant neoplasm of colon: Secondary | ICD-10-CM

## 2023-11-02 DIAGNOSIS — Z0000 Encounter for general adult medical examination without abnormal findings: Secondary | ICD-10-CM

## 2023-11-02 NOTE — Patient Instructions (Signed)
 David Garza , Thank you for taking time out of your busy schedule to complete your Annual Wellness Visit with me. I enjoyed our conversation and look forward to speaking with you again next year. I, as well as your care team,  appreciate your ongoing commitment to your health goals. Please review the following plan we discussed and let me know if I can assist you in the future. Your Game plan/ To Do List     Follow up Visits: Next Medicare AWV with our clinical staff: 11/05/24 at 8:00a.m.   Have you seen your provider in the last 6 months (3 months if uncontrolled diabetes)? Yes Next Office Visit with your provider: 12/14/23 at 8:00a.m.  Clinician Recommendations:  Aim for 30 minutes of exercise or brisk walking, 6-8 glasses of water, and 5 servings of fruits and vegetables each day.       This is a list of the screening recommended for you and due dates:  Health Maintenance  Topic Date Due   Complete foot exam   12/15/2022   Colon Cancer Screening  05/06/2023   Eye exam for diabetics  05/27/2023   Medicare Annual Wellness Visit  11/01/2023   DTaP/Tdap/Td vaccine (2 - Td or Tdap) 06/15/2024*   COVID-19 Vaccine (3 - 2024-25 season) 11/17/2024*   Flu Shot  12/01/2023   Yearly kidney health urinalysis for diabetes  12/12/2023   Hemoglobin A1C  12/12/2023   Yearly kidney function blood test for diabetes  06/13/2024   Pneumococcal Vaccine for age over 50  Completed   Hepatitis C Screening  Completed   Zoster (Shingles) Vaccine  Completed   Hepatitis B Vaccine  Aged Out   HPV Vaccine  Aged Out   Meningitis B Vaccine  Aged Out  *Topic was postponed. The date shown is not the original due date.    Advanced directives: (Declined) Advance directive discussed with you today. Even though you declined this today, please call our office should you change your mind, and we can give you the proper paperwork for you to fill out. Advance Care Planning is important because it:  [x]  Makes sure you  receive the medical care that is consistent with your values, goals, and preferences  [x]  It provides guidance to your family and loved ones and reduces their decisional burden about whether or not they are making the right decisions based on your wishes.  Follow the link provided in your after visit summary or read over the paperwork we have mailed to you to help you started getting your Advance Directives in place. If you need assistance in completing these, please reach out to us  so that we can help you!  See attachments for Preventive Care and Fall Prevention Tips.

## 2023-11-02 NOTE — Progress Notes (Signed)
 Subjective:   David Garza is a 73 y.o. who presents for a Medicare Wellness preventive visit.  As a reminder, Annual Wellness Visits don't include a physical exam, and some assessments may be limited, especially if this visit is performed virtually. We may recommend an in-person follow-up visit with your provider if needed.  Visit Complete: Virtual I connected with  David Garza on 11/02/23 by a audio enabled telemedicine application and verified that I am speaking with the correct person using two identifiers.  Patient Location: Home  Provider Location: Home Office  I discussed the limitations of evaluation and management by telemedicine. The patient expressed understanding and agreed to proceed.  Vital Signs: Because this visit was a virtual/telehealth visit, some criteria may be missing or patient reported. Any vitals not documented were not able to be obtained and vitals that have been documented are patient reported.  VideoDeclined- This patient declined Librarian, academic. Therefore the visit was completed with audio only.  Persons Participating in Visit: Patient.  AWV Questionnaire: No: Patient Medicare AWV questionnaire was not completed prior to this visit.  Cardiac Risk Factors include: advanced age (>73men, >79 women);diabetes mellitus;dyslipidemia;hypertension;obesity (BMI >30kg/m2);smoking/ tobacco exposure     Objective:    Today's Vitals   11/02/23 0826  BP: 128/76  Pulse: 83  Weight: 196 lb (88.9 kg)  Height: 5' 8 (1.727 m)   Body mass index is 29.8 kg/m.     11/02/2023    8:31 AM 11/01/2022    8:24 AM 10/28/2021    8:25 AM 10/27/2020    8:33 AM 10/25/2019    8:40 AM 09/19/2018    9:57 AM 05/16/2014    9:43 AM  Advanced Directives  Does Patient Have a Medical Advance Directive? No No No No No No No   Would patient like information on creating a medical advance directive?  No - Patient declined Yes  (MAU/Ambulatory/Procedural Areas - Information given) Yes (MAU/Ambulatory/Procedural Areas - Information given) No - Patient declined No - Patient declined  Yes - Educational materials given      Data saved with a previous flowsheet row definition    Current Medications (verified) Outpatient Encounter Medications as of 11/02/2023  Medication Sig   clopidogrel  (PLAVIX ) 75 MG tablet Take 1 tablet (75 mg total) by mouth daily.   colchicine  0.6 MG tablet TAKE 2 TABLETS BY MOUTH FOR 1 DOSE, THEN 1 TABLET ONE HOUR LATER   DULoxetine  (CYMBALTA ) 60 MG capsule Take 1 capsule (60 mg total) by mouth daily.   fish oil-omega-3 fatty acids 1000 MG capsule Take 2 g by mouth daily.   gabapentin  (NEURONTIN ) 100 MG capsule Take 2 capsules (200 mg total) by mouth 2 (two) times daily.   lisinopril -hydrochlorothiazide  (ZESTORETIC ) 20-25 MG tablet Take 1 tablet by mouth daily.   meclizine  (ANTIVERT ) 12.5 MG tablet Take 1 tablet (12.5 mg total) by mouth 3 (three) times daily as needed for dizziness.   omeprazole  (PRILOSEC) 40 MG capsule Take 1 capsule (40 mg total) by mouth daily.   pravastatin  (PRAVACHOL ) 80 MG tablet Take 1 tablet (80 mg total) by mouth daily.   cyclobenzaprine  (FLEXERIL ) 10 MG tablet Take 1 tablet (10 mg total) by mouth 3 (three) times daily as needed for muscle spasms. (Patient not taking: Reported on 11/02/2023)   No facility-administered encounter medications on file as of 11/02/2023.    Allergies (verified) Meloxicam and Nsaids   History: Past Medical History:  Diagnosis Date   Hyperlipidemia  Hypertension    Metabolic syndrome    Stroke [facility]) 06/2016   Past Surgical History:  Procedure Laterality Date   KNEE ARTHROSCOPY Right    SHOULDER SURGERY Right    Family History  Problem Relation Age of Onset   Cancer Mother        pancreatic   Alzheimer's disease Mother 56   Diabetes Sister    Aneurysm Sister        questionable - possible heart attack    Coronary artery disease  Maternal Grandfather    Alcohol abuse Maternal Grandfather    GI Disease Maternal Grandfather    Early death Father    Alzheimer's disease Paternal Uncle 100   Aneurysm Paternal Uncle    Cancer Sister        male   Social History   Socioeconomic History   Marital status: Married    Spouse name: Rock    Number of children: 1   Years of education: 12   Highest education level: High school graduate  Occupational History   Occupation: Retired    Comment: continues to farm   Tobacco Use   Smoking status: Former    Current packs/day: 0.00    Types: Cigarettes    Quit date: 04/01/1998    Years since quitting: 25.6   Smokeless tobacco: Never  Vaping Use   Vaping status: Never Used  Substance and Sexual Activity   Alcohol use: No   Drug use: No   Sexual activity: Not on file  Other Topics Concern   Not on file  Social History Narrative   Lives at home with Wife Rock and daughter Cena    He and his wife go to [place] and the [employer] 3x per week and 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   Social Drivers of [employer] Strain: Low Risk  (11/02/2023)   Overall Financial Resource Strain (CARDIA)    Difficulty of Paying Living Expenses: Not hard at all  Food Insecurity: No Food Insecurity (11/02/2023)   Hunger Vital Sign    Worried About Running Out of Food in the Last Year: Never true    Ran Out of Food in the Last Year: Never true  Transportation Needs: No Transportation Needs (11/02/2023)   PRAPARE - Administrator, Civil Service (Medical): No    Lack of Transportation (Non-Medical): No  Physical Activity: Insufficiently Active (11/02/2023)   Exercise Vital Sign    Days of Exercise per Week: 3 days    Minutes of Exercise per Session: 30 min  Stress: No Stress Concern Present (11/02/2023)   [employer] of Occupational Health - Occupational Stress Questionnaire    Feeling of Stress: Not at all  Social Connections: Socially Integrated (11/02/2023)    Social Connection and Isolation Panel    Frequency of Communication with Friends and Family: Three times a week    Frequency of Social Gatherings with Friends and Family: Three times a week    Attends Religious Services: More than 4 times per year    Active Member of Clubs or Organizations: Yes    Attends Banker Meetings: Never    Marital Status: Married    Tobacco Counseling Counseling given: Yes    Clinical Intake:  Pre-visit preparation completed: Yes  Pain : No/denies pain     BMI - recorded: 29.8 Nutritional Status: BMI 25 -29 Overweight Nutritional Risks: None Diabetes: No  Lab Results  Component Value Date  HGBA1C 5.0 06/14/2023   HGBA1C 5.2 12/12/2022   HGBA1C 6.0 (H) 06/13/2022     How often do you need to have someone help you when you read instructions, pamphlets, or other written materials from your doctor or pharmacy?: 1 - Never  Interpreter Needed?: No  Information entered by :: Alia t/cma   Activities of Daily Living     11/02/2023    8:34 AM  In your present state of health, do you have any difficulty performing the following activities:  Hearing? 0  Vision? 0  Difficulty concentrating or making decisions? 0  Walking or climbing stairs? 0  Dressing or bathing? 0  Doing errands, shopping? 0  Preparing Food and eating ? N  Using the Toilet? N  In the past six months, have you accidently leaked urine? N  Do you have problems with loss of bowel control? N  Managing your Medications? N  Managing your Finances? N  Housekeeping or managing your Housekeeping? N    Patient Care Team: Gladis Mustard, FNP as PCP - General (Nurse Practitioner) Ivery Kitchens, MD (Surgery) [place], Amy, MD as Consulting Physician (Dermatology)  I have updated your Care Teams any recent Medical Services you may have received from other providers in the past year.     Assessment:   This is a routine wellness examination for  David Garza.  Hearing/Vision screen Hearing Screening - Comments:: Pt denies hearing dif Vision Screening - Comments:: Pt denies vision dif/wears readers for fine print/pt goes [facility], [place]/upcoming apt   Goals Addressed             This Visit's Progress    Patient Stated       Getting his 36 Impala fix and done       Depression Screen     11/02/2023    8:37 AM 06/16/2023    8:10 AM 12/15/2022    8:21 AM 11/01/2022    8:23 AM 08/26/2022   12:01 PM 12/14/2021    8:01 AM 10/28/2021    8:24 AM  PHQ 2/9 Scores  PHQ - 2 Score 0 0 0 0 0 0 0  PHQ- 9 Score   0  0 0 0    Fall Risk     11/02/2023    8:27 AM 06/16/2023    8:10 AM 12/15/2022    8:21 AM 11/01/2022    8:21 AM 08/26/2022   12:01 PM  Fall Risk   Falls in the past year? 0 0 0 0 0  Number falls in past yr: 0   0   Injury with Fall? 0   0   Risk for fall due to : No Fall Risks   No Fall Risks   Follow up Falls evaluation completed   Falls prevention discussed     MEDICARE RISK AT HOME:  Medicare Risk at Home Any stairs in or around the home?: No If so, are there any without handrails?: No Home free of loose throw rugs in walkways, pet beds, electrical cords, etc?: Yes Adequate lighting in your home to reduce risk of falls?: Yes Life alert?: No Use of a cane, walker or w/c?: No Grab bars in the bathroom?: Yes Shower chair or bench in shower?: No Elevated toilet seat or a handicapped toilet?: No  TIMED UP AND GO:  Was the test performed?  no  Cognitive Function: 6CIT completed    09/13/2017   11:54 AM  MMSE - Mini Mental State Exam  Orientation to  time 5  Orientation to Place 5  Registration 3  Attention/ Calculation 5  Recall 2  Language- name 2 objects 2  Language- repeat 1  Language- follow 3 step command 3  Language- read & follow direction 1  Write a sentence 1  Copy design 1  Total score 29        11/02/2023    8:38 AM 11/01/2022    8:25 AM 10/28/2021    8:27 AM 10/25/2019    8:44 AM 09/19/2018    10:04 AM  6CIT Screen  What Year? 0 points 0 points 0 points 0 points 0 points  What month? 0 points 0 points 0 points 0 points 0 points  What time? 0 points 0 points 0 points 0 points 0 points  Count back from 20 0 points 0 points 0 points 0 points 0 points  Months in reverse 0 points 0 points 0 points 0 points 0 points  Repeat phrase 0 points 0 points 0 points 0 points 0 points  Total Score 0 points 0 points 0 points 0 points 0 points    Immunizations Immunization History  Administered Date(s) Administered   Fluad Quad(high Dose 65+) 01/31/2019   Influenza, High Dose Seasonal PF 01/24/2018   Influenza,inj,Quad PF,6+ Mos 03/19/2013, 02/03/2014, 01/29/2015, 02/03/2017   Influenza-Unspecified 02/11/2016, 02/06/2020, 01/30/2021, 01/30/2022   Moderna Sars-Covid-2 Vaccination 07/01/2019, 08/01/2019   Pneumococcal Conjugate-13 03/14/2017   Pneumococcal Polysaccharide-23 07/05/2018   Tdap 09/17/2012   Zoster Recombinant(Shingrix) 11/20/2020, 01/30/2021   Zoster, Live 05/16/2014    Screening Tests Health Maintenance  Topic Date Due   FOOT EXAM  12/15/2022   Colonoscopy  05/06/2023   OPHTHALMOLOGY EXAM  05/27/2023   DTaP/Tdap/Td (2 - Td or Tdap) 06/15/2024 (Originally 09/18/2022)   COVID-19 Vaccine (3 - 2024-25 season) 11/17/2024 (Originally 01/01/2023)   INFLUENZA VACCINE  12/01/2023   Diabetic kidney evaluation - Urine ACR  12/12/2023   HEMOGLOBIN A1C  12/12/2023   Diabetic kidney evaluation - eGFR measurement  06/13/2024   Medicare Annual Wellness (AWV)  11/01/2024   Pneumococcal Vaccine: 50+ Years  Completed   Hepatitis C Screening  Completed   Zoster Vaccines- Shingrix  Completed   Hepatitis B Vaccines  Aged Out   HPV VACCINES  Aged Out   Meningococcal B Vaccine  Aged Out    Health Maintenance  Health Maintenance Due  Topic Date Due   FOOT EXAM  12/15/2022   Colonoscopy  05/06/2023   OPHTHALMOLOGY EXAM  05/27/2023   Health Maintenance Items Addressed: Referral  sent to GI for colonoscopy  Additional Screening:  Vision Screening: Recommended annual ophthalmology exams for early detection of glaucoma and other disorders of the eye. Would you like a referral to an eye doctor? No    Dental Screening: Recommended annual dental exams for proper oral hygiene  Community Resource Referral / Chronic Care Management: CRR required this visit?  No   CCM required this visit?  No   Plan:    I have personally reviewed and noted the following in the patient's chart:   Medical and social history Use of alcohol, tobacco or illicit drugs  Current medications and supplements including opioid prescriptions. Patient is not currently taking opioid prescriptions. Functional ability and status Nutritional status Physical activity Advanced directives List of other physicians Hospitalizations, surgeries, and ER visits in previous 12 months Vitals Screenings to include cognitive, depression, and falls Referrals and appointments  In addition, I have reviewed and discussed with patient certain preventive protocols, quality metrics,  and best practice recommendations. A written personalized care plan for preventive services as well as general preventive health recommendations were provided to patient.   David Garza, CMA   11/02/2023   After Visit Summary: (MyChart) Due to this being a telephonic visit, the after visit summary with patients personalized plan was offered to patient via MyChart   Notes: Pt is aware due for the following: Eye appt-per pt is making an apt/foot exam at his next visit w/pcp/colonoscopy ref GI as been placed.

## 2023-11-30 ENCOUNTER — Other Ambulatory Visit: Payer: Self-pay

## 2023-11-30 DIAGNOSIS — E785 Hyperlipidemia, unspecified: Secondary | ICD-10-CM

## 2023-11-30 DIAGNOSIS — I10 Essential (primary) hypertension: Secondary | ICD-10-CM

## 2023-11-30 DIAGNOSIS — E119 Type 2 diabetes mellitus without complications: Secondary | ICD-10-CM

## 2023-11-30 MED ORDER — CYCLOBENZAPRINE HCL 10 MG PO TABS: 10.0000 mg | ORAL_TABLET | Freq: Three times a day (TID) | ORAL | 1 refills | Status: AC | PRN

## 2023-12-01 ENCOUNTER — Encounter: Payer: Self-pay | Admitting: Family Medicine

## 2023-12-01 ENCOUNTER — Ambulatory Visit: Admitting: Family Medicine

## 2023-12-01 VITALS — BP 97/59 | HR 76 | Temp 97.5°F | Ht 68.0 in | Wt 194.4 lb

## 2023-12-01 DIAGNOSIS — M5441 Lumbago with sciatica, right side: Secondary | ICD-10-CM

## 2023-12-01 MED ORDER — METHYLPREDNISOLONE ACETATE 40 MG/ML IJ SUSP
40.0000 mg | Freq: Once | INTRAMUSCULAR | Status: AC
  Administered 2023-12-01: 40 mg via INTRAMUSCULAR

## 2023-12-01 MED ORDER — TRAMADOL HCL 50 MG PO TABS: 50.0000 mg | ORAL_TABLET | Freq: Three times a day (TID) | ORAL | 0 refills | Status: DC | PRN

## 2023-12-01 MED ORDER — PREDNISONE 20 MG PO TABS: 40.0000 mg | ORAL_TABLET | Freq: Every day | ORAL | 0 refills | Status: AC

## 2023-12-01 NOTE — Progress Notes (Signed)
 Acute Office Visit  Subjective:     Patient ID: David Garza, male    DOB: [birth year], 72 y.o.   MRN: [id number]  Chief Complaint  Patient presents with   Back Pain    Back Pain This is a new problem. The current episode started in the past 7 days. The problem occurs constantly. The problem is unchanged. The pain is present in the gluteal. The quality of the pain is described as shooting and aching (sharp). The pain is at a severity of 8/10. The symptoms are aggravated by bending, standing, twisting and position (walking). Associated symptoms include leg pain (back of right leg to lower leg). Pertinent negatives include no bladder incontinence, bowel incontinence, dysuria, fever, numbness, pelvic pain, perianal numbness, tingling (toes) or weakness. He has tried muscle relaxant, analgesics, walking, bed rest, heat and ice (gabapentin - chronic use, tylenol , voltaren  gel) for the symptoms. The treatment provided mild relief.   Started after lifting heavy tractor batteries and placing in bed of truck. Hx of herniated disc years ago that improved with conservative measures.   Review of Systems  Constitutional:  Negative for fever.  Gastrointestinal:  Negative for bowel incontinence.  Genitourinary:  Negative for bladder incontinence, dysuria and pelvic pain.  Musculoskeletal:  Positive for back pain.  Neurological:  Negative for tingling (toes), weakness and numbness.        Objective:    BP (!) 97/59   Pulse 76   Temp (!) 97.5 F (36.4 C) (Temporal)   Ht 5' 8 (1.727 m)   Wt 194 lb 6.4 oz (88.2 kg)   SpO2 93%   BMI 29.56 kg/m    Physical Exam Vitals and nursing note reviewed.  Constitutional:      General: He is not in acute distress.    Appearance: He is not ill-appearing, toxic-appearing or diaphoretic.  Pulmonary:     Effort: Pulmonary effort is normal. No respiratory distress.  Musculoskeletal:     Lumbar back: Tenderness present. No swelling, edema, signs of  trauma, spasms or bony tenderness. Normal range of motion. Positive right straight leg raise test. No scoliosis.     Right lower leg: No edema.     Left lower leg: No edema.  Skin:    General: Skin is warm and dry.  Neurological:     General: No focal deficit present.     Mental Status: He is alert and oriented to person, place, and time.     Gait: Gait abnormal (using cane).  Psychiatric:        Mood and Affect: Mood normal.        Behavior: Behavior normal.     No results found for any visits on 12/01/23.      Assessment & Plan:   David Garza was seen today for back pain.  Diagnoses and all orders for this visit:  Acute right-sided low back pain with right-sided sciatica No red flag symptoms. Steroid IM today, followed by prednisone  burst tomorrow. Monitor BP. NSAIDs contraindicated. Tramadol prn- use sparingly, discussed risks. Continue tylenol , heat, flexeril  prn, rest. Return to office for new or worsening symptoms, or if symptoms persist.  -     traMADol (ULTRAM) 50 MG tablet; Take 1 tablet (50 mg total) by mouth every 8 (eight) hours as needed for severe pain (pain score 7-10). -     methylPREDNISolone  acetate (DEPO-MEDROL ) injection 40 mg -     predniSONE  (DELTASONE ) 20 MG tablet; Take 2 tablets (40 mg total) by mouth  daily with breakfast for 5 days. Start tomorrow.  The patient indicates understanding of these issues and agrees with the plan.  David CHRISTELLA Search, FNP

## 2023-12-05 ENCOUNTER — Ambulatory Visit (INDEPENDENT_AMBULATORY_CARE_PROVIDER_SITE_OTHER): Admitting: Nurse Practitioner

## 2023-12-05 ENCOUNTER — Other Ambulatory Visit: Payer: Self-pay

## 2023-12-05 ENCOUNTER — Encounter: Payer: Self-pay | Admitting: Nurse Practitioner

## 2023-12-05 VITALS — BP 137/84 | HR 67 | Temp 97.5°F | Ht 68.0 in | Wt 198.0 lb

## 2023-12-05 DIAGNOSIS — Z6833 Body mass index (BMI) 33.0-33.9, adult: Secondary | ICD-10-CM

## 2023-12-05 DIAGNOSIS — Z7984 Long term (current) use of oral hypoglycemic drugs: Secondary | ICD-10-CM | POA: Diagnosis not present

## 2023-12-05 DIAGNOSIS — E785 Hyperlipidemia, unspecified: Secondary | ICD-10-CM | POA: Diagnosis not present

## 2023-12-05 DIAGNOSIS — M1A0710 Idiopathic chronic gout, right ankle and foot, without tophus (tophi): Secondary | ICD-10-CM

## 2023-12-05 DIAGNOSIS — Z0001 Encounter for general adult medical examination with abnormal findings: Secondary | ICD-10-CM | POA: Diagnosis not present

## 2023-12-05 DIAGNOSIS — E119 Type 2 diabetes mellitus without complications: Secondary | ICD-10-CM

## 2023-12-05 DIAGNOSIS — M5431 Sciatica, right side: Secondary | ICD-10-CM | POA: Diagnosis not present

## 2023-12-05 DIAGNOSIS — K219 Gastro-esophageal reflux disease without esophagitis: Secondary | ICD-10-CM

## 2023-12-05 DIAGNOSIS — Z0000 Encounter for general adult medical examination without abnormal findings: Secondary | ICD-10-CM

## 2023-12-05 DIAGNOSIS — I152 Hypertension secondary to endocrine disorders: Secondary | ICD-10-CM

## 2023-12-05 DIAGNOSIS — E1169 Type 2 diabetes mellitus with other specified complication: Secondary | ICD-10-CM | POA: Diagnosis not present

## 2023-12-05 DIAGNOSIS — G63 Polyneuropathy in diseases classified elsewhere: Secondary | ICD-10-CM

## 2023-12-05 DIAGNOSIS — E1159 Type 2 diabetes mellitus with other circulatory complications: Secondary | ICD-10-CM | POA: Diagnosis not present

## 2023-12-05 DIAGNOSIS — I6930 Unspecified sequelae of cerebral infarction: Secondary | ICD-10-CM | POA: Diagnosis not present

## 2023-12-05 LAB — CBC WITH DIFFERENTIAL/PLATELET
Basophils Absolute: 0.1 x10E3/uL (ref 0.0–0.2)
Basos: 1 %
EOS (ABSOLUTE): 0.1 x10E3/uL (ref 0.0–0.4)
Eos: 0 %
Hematocrit: 48.9 % (ref 37.5–51.0)
Hemoglobin: 16.5 g/dL (ref 13.0–17.7)
Immature Grans (Abs): 0.1 x10E3/uL (ref 0.0–0.1)
Immature Granulocytes: 1 %
Lymphocytes Absolute: 1.7 x10E3/uL (ref 0.7–3.1)
Lymphs: 13 %
MCH: 30.7 pg (ref 26.6–33.0)
MCHC: 33.7 g/dL (ref 31.5–35.7)
MCV: 91 fL (ref 79–97)
Monocytes Absolute: 0.8 x10E3/uL (ref 0.1–0.9)
Monocytes: 6 %
Neutrophils Absolute: 10.5 x10E3/uL — ABNORMAL HIGH (ref 1.4–7.0)
Neutrophils: 79 %
Platelets: 254 x10E3/uL (ref 150–450)
RBC: 5.38 x10E6/uL (ref 4.14–5.80)
RDW: 12.9 % (ref 11.6–15.4)
WBC: 13.2 x10E3/uL — ABNORMAL HIGH (ref 3.4–10.8)

## 2023-12-05 LAB — CMP14+EGFR
ALT: 19 IU/L (ref 0–44)
AST: 13 IU/L (ref 0–40)
Albumin: 4.6 g/dL (ref 3.8–4.8)
Alkaline Phosphatase: 43 IU/L — ABNORMAL LOW (ref 44–121)
BUN/Creatinine Ratio: 12 (ref 10–24)
BUN: 13 mg/dL (ref 8–27)
Bilirubin Total: 0.5 mg/dL (ref 0.0–1.2)
CO2: 23 mmol/L (ref 20–29)
Calcium: 9.7 mg/dL (ref 8.6–10.2)
Chloride: 95 mmol/L — ABNORMAL LOW (ref 96–106)
Creatinine, Ser: 1.07 mg/dL (ref 0.76–1.27)
Globulin, Total: 2.1 g/dL (ref 1.5–4.5)
Glucose: 110 mg/dL — ABNORMAL HIGH (ref 70–99)
Potassium: 3.7 mmol/L (ref 3.5–5.2)
Sodium: 135 mmol/L (ref 134–144)
Total Protein: 6.7 g/dL (ref 6.0–8.5)
eGFR: 74 mL/min/1.73 (ref >59)

## 2023-12-05 LAB — LIPID PANEL
Chol/HDL Ratio: 3.8 ratio (ref 0.0–5.0)
Cholesterol, Total: 163 mg/dL (ref 100–199)
HDL: 43 mg/dL (ref >39)
LDL Chol Calc (NIH): 94 mg/dL (ref 0–99)
Triglycerides: 146 mg/dL (ref 0–149)
VLDL Cholesterol Cal: 26 mg/dL (ref 5–40)

## 2023-12-05 LAB — BAYER DCA HB A1C WAIVED: HB A1C (BAYER DCA - WAIVED): 5.3 % (ref 4.8–5.6)

## 2023-12-05 MED ORDER — PRAVASTATIN SODIUM 80 MG PO TABS: 80.0000 mg | ORAL_TABLET | Freq: Every day | ORAL | 1 refills | Status: DC

## 2023-12-05 MED ORDER — HYDROCODONE-ACETAMINOPHEN 5-325 MG PO TABS: 1.0000 | ORAL_TABLET | Freq: Four times a day (QID) | ORAL | 0 refills | Status: DC | PRN

## 2023-12-05 MED ORDER — OMEPRAZOLE 40 MG PO CPDR: 40.0000 mg | DELAYED_RELEASE_CAPSULE | Freq: Every day | ORAL | 1 refills | Status: DC

## 2023-12-05 MED ORDER — CLOPIDOGREL BISULFATE 75 MG PO TABS: 75.0000 mg | ORAL_TABLET | Freq: Every day | ORAL | 1 refills | Status: DC

## 2023-12-05 MED ORDER — PREDNISONE 10 MG (21) PO TBPK: ORAL_TABLET | ORAL | 0 refills | Status: DC

## 2023-12-05 MED ORDER — GABAPENTIN 100 MG PO CAPS: 200.0000 mg | ORAL_CAPSULE | Freq: Two times a day (BID) | ORAL | 1 refills | Status: DC

## 2023-12-05 MED ORDER — DULOXETINE HCL 60 MG PO CPEP: 60.0000 mg | ORAL_CAPSULE | Freq: Every day | ORAL | 1 refills | Status: DC

## 2023-12-05 MED ORDER — KETOROLAC TROMETHAMINE 60 MG/2ML IM SOLN
60.0000 mg | Freq: Once | INTRAMUSCULAR | Status: AC
  Administered 2023-12-05: 60 mg via INTRAMUSCULAR

## 2023-12-05 MED ORDER — LISINOPRIL-HYDROCHLOROTHIAZIDE 20-25 MG PO TABS: 1.0000 | ORAL_TABLET | Freq: Every day | ORAL | 1 refills | Status: DC

## 2023-12-05 NOTE — Patient Instructions (Signed)
 Sciatica  Sciatica is pain, weakness, tingling, or loss of feeling (numbness) along the sciatic nerve. The sciatic nerve starts in the lower back and goes down the back of each leg. Sciatica usually affects one side of the body. Sciatica usually goes away on its own or with treatment. Sometimes, sciatica may come back. What are the causes? This condition happens when the sciatic nerve is pinched or has pressure put on it. This may be caused by: A disk in between the bones of the spine bulging out too far (herniated disk). Changes in the spinal disks due to aging. A condition that affects a muscle in the butt. Extra bone growth near the sciatic nerve. A break (fracture) of the area between your hip bones (pelvis). Pregnancy. Tumor. This is rare. What increases the risk? You are more likely to develop this condition if you: Play sports that put pressure or stress on the spine. Have poor strength and ease of movement (flexibility). Have had a back injury or back surgery. Sit for long periods of time. Do activities that involve bending or lifting over and over again. Are very overweight (obese). What are the signs or symptoms? Symptoms can vary from mild to very bad. They may include: Any of these problems in the lower back, leg, hip, or butt: Mild tingling, loss of feeling, or dull aches. A burning feeling. Sharp pains. Loss of feeling in the back of the calf or the sole of the foot. Leg weakness. Very bad back pain that makes it hard to move. These symptoms may get worse when you cough, sneeze, or laugh. They may also get worse when you sit or stand for long periods of time. How is this treated? This condition often gets better without any treatment. However, treatment may include: Changing or cutting back on physical activity when you have pain. Exercising, including strengthening and stretching. Putting ice or heat on the affected area. Shots of medicines to relieve pain and  swelling or to relax your muscles. Surgery. Follow these instructions at home: Medicines Take over-the-counter and prescription medicines only as told by your doctor. Ask your doctor if you should avoid driving or using machines while you are taking your medicine. Managing pain     If told, put ice on the affected area. To do this: Put ice in a plastic bag. Place a towel between your skin and the bag. Leave the ice on for 20 minutes, 2-3 times a day. If your skin turns bright red, take off the ice right away to prevent skin damage. The risk of skin damage is higher if you cannot feel pain, heat, or cold. If told, put heat on the affected area. Do this as often as told by your doctor. Use the heat source that your doctor tells you to use, such as a moist heat pack or a heating pad. Place a towel between your skin and the heat source. Leave the heat on for 20-30 minutes. If your skin turns bright red, take off the heat right away to prevent burns. The risk of burns is higher if you cannot feel pain, heat, or cold. Activity  Return to your normal activities when your doctor says that it is safe. Avoid activities that make your symptoms worse. Take short rests during the day. When you rest for a long time, do some physical activity or stretching between periods of rest. Avoid sitting for a long time without moving. Get up and move around at least one time each  hour. Do exercises and stretches as told by your doctor. Do not lift anything that is heavier than 10 lb (4.5 kg). Avoid lifting heavy things even when you do not have symptoms. Avoid lifting heavy things over and over. When you lift objects, always lift in a way that is safe for your body. To do this, you should: Bend your knees. Keep the object close to your body. Avoid twisting. General instructions Stay at a healthy weight. Wear comfortable shoes that support your feet. Avoid wearing high heels. Avoid sleeping on a mattress  that is too soft or too hard. You might have less pain if you sleep on a mattress that is firm enough to support your back. Contact a doctor if: Your pain is not controlled by medicine. Your pain does not get better. Your pain gets worse. Your pain lasts longer than 4 weeks. You lose weight without trying. Get help right away if: You cannot control when you pee (urinate) or poop (have a bowel movement). You have weakness in any of these areas and it gets worse: Lower back. The area between your hip bones. Butt. Legs. You have redness or swelling of your back. You have a burning feeling when you pee. Summary Sciatica is pain, weakness, tingling, or loss of feeling (numbness) along the sciatic nerve. This may include the lower back, legs, hips, and butt. This condition happens when the sciatic nerve is pinched or has pressure put on it. Treatment often includes rest, exercise, medicines, and putting ice or heat on the affected area. This information is not intended to replace advice given to you by your health care provider. Make sure you discuss any questions you have with your health care provider. Document Revised: 07/26/2021 Document Reviewed: 07/26/2021 Elsevier Patient Education  2024 [employer].

## 2023-12-05 NOTE — Progress Notes (Signed)
 Subjective:    Patient ID: David Garza, male    DOB: 06-Dec-1950, 73 y.o.   MRN: [id number]   Chief Complaint: annual physical    HPI:  David Garza is a 73 y.o. who identifies as a male who was assigned male at birth.   Social history: Lives with: wife on farm Work history: farmer   Comes in today for follow up of the following chronic medical issues:  1. Primary hypertension No c/o chest pain, sob or headache. Does not check blood pressure at home. BP Readings from Last 3 Encounters:  12/01/23 (!) 97/59  11/02/23 128/76  06/16/23 128/76     2. Hyperlipidemia associated with type 2 diabetes mellitus (HCC) Does try to watch diet and stays very active on his farm Lab Results  Component Value Date   CHOL 145 06/14/2023   HDL 30 (L) 06/14/2023   LDLCALC 90 06/14/2023   TRIG 139 06/14/2023   CHOLHDL 4.8 06/14/2023     3. Diabetes mellitus treated with oral medication [facility]) He has not  been checking his blood sugars at home. No low blood sugars. Lab Results  Component Value Date   HGBA1C 5.0 06/14/2023     4. Neuropathy due to medical condition [facility]) Mainly in right hand since his stroke. Just feels numb all the time.  5. Late effect of cerebrovascular accident (CVA) Only issue he is having is mild right sided weakness.  6. Gastroesophageal reflux disease without esophagitis Is on omperozole daily and is doing well.  7. Idiopathic chronic gout of right foot without tophus No recent gout flare ups  8. BMI 33.0-33.9,adult Weight is down 3lbs  Wt Readings from Last 3 Encounters:  12/01/23 194 lb 6.4 oz (88.2 kg)  11/02/23 196 lb (88.9 kg)  06/16/23 196 lb 12.8 oz (89.3 kg)   BMI Readings from Last 3 Encounters:  12/01/23 29.56 kg/m  11/02/23 29.80 kg/m  06/16/23 29.92 kg/m         New complaints: Patient was seen last week by T.Morgan- he had injured his back doing work on his farm. She gave him low dose steroids for 5 days and  ultram . He says that no help. Tylenol  works better then tramadol . He did take muscle relaxer last night with no relief.   Back Pain This is a new problem. The current episode started 1 to 4 weeks ago. The problem occurs intermittently. The problem has been waxing and waning since onset. The pain is present in the lumbar spine. The pain radiates to the right knee. The pain is at a severity of 7/10. The pain is moderate. The symptoms are aggravated by standing and position. Pertinent negatives include no abdominal pain, chest pain, headaches or weakness. Treatments tried: prednisone .     Allergies  Allergen Reactions   Meloxicam Hypertension   Nsaids Hypertension   Outpatient Encounter Medications as of 12/05/2023  Medication Sig   clopidogrel  (PLAVIX ) 75 MG tablet Take 1 tablet (75 mg total) by mouth daily.   colchicine  0.6 MG tablet TAKE 2 TABLETS BY MOUTH FOR 1 DOSE, THEN 1 TABLET ONE HOUR LATER   cyclobenzaprine  (FLEXERIL ) 10 MG tablet Take 1 tablet (10 mg total) by mouth 3 (three) times daily as needed for muscle spasms.   DULoxetine  (CYMBALTA ) 60 MG capsule Take 1 capsule (60 mg total) by mouth daily.   fish oil-omega-3 fatty acids 1000 MG capsule Take 2 g by mouth daily.   gabapentin  (NEURONTIN ) 100 MG capsule Take  2 capsules (200 mg total) by mouth 2 (two) times daily.   lisinopril -hydrochlorothiazide  (ZESTORETIC ) 20-25 MG tablet Take 1 tablet by mouth daily.   meclizine  (ANTIVERT ) 12.5 MG tablet Take 1 tablet (12.5 mg total) by mouth 3 (three) times daily as needed for dizziness.   omeprazole  (PRILOSEC) 40 MG capsule Take 1 capsule (40 mg total) by mouth daily.   pravastatin  (PRAVACHOL ) 80 MG tablet Take 1 tablet (80 mg total) by mouth daily.   predniSONE  (DELTASONE ) 20 MG tablet Take 2 tablets (40 mg total) by mouth daily with breakfast for 5 days. Start tomorrow.   traMADol  (ULTRAM ) 50 MG tablet Take 1 tablet (50 mg total) by mouth every 8 (eight) hours as needed for severe pain (pain  score 7-10).   No facility-administered encounter medications on file as of 12/05/2023.    Past Surgical History:  Procedure Laterality Date   KNEE ARTHROSCOPY Right    SHOULDER SURGERY Right     Family History  Problem Relation Age of Onset   Cancer Mother        pancreatic   Alzheimer's disease Mother 58   Diabetes Sister    Aneurysm Sister        questionable - possible heart attack    Coronary artery disease Maternal Grandfather    Alcohol abuse Maternal Grandfather    GI Disease Maternal Grandfather    Early death Father    Alzheimer's disease Paternal Uncle 35   Aneurysm Paternal Uncle    Cancer Sister        male      Controlled substance contract: n/a     Review of Systems  Constitutional:  Negative for diaphoresis.  Eyes:  Negative for pain.  Respiratory:  Negative for shortness of breath.   Cardiovascular:  Negative for chest pain, palpitations and leg swelling.  Gastrointestinal:  Negative for abdominal pain.  Endocrine: Negative for polydipsia.  Musculoskeletal:  Positive for back pain.  Skin:  Negative for rash.  Neurological:  Negative for dizziness, weakness and headaches.  Hematological:  Does not bruise/bleed easily.  All other systems reviewed and are negative.      Objective:   Physical Exam Vitals and nursing note reviewed.  Constitutional:      Appearance: Normal appearance. He is well-developed.  HENT:     Head: Normocephalic.     Nose: Nose normal.     Mouth/Throat:     Mouth: Mucous membranes are moist.     Pharynx: Oropharynx is clear.  Eyes:     Pupils: Pupils are equal, round, and reactive to light.  Neck:     Thyroid: No thyroid mass or thyromegaly.     Vascular: No carotid bruit or JVD.     Trachea: Phonation normal.  Cardiovascular:     Rate and Rhythm: Normal rate and regular rhythm.  Pulmonary:     Effort: Pulmonary effort is normal. No respiratory distress.     Breath sounds: Normal breath sounds.  Abdominal:      General: Bowel sounds are normal.     Palpations: Abdomen is soft.     Tenderness: There is no abdominal tenderness.  Musculoskeletal:        General: Normal range of motion.     Cervical back: Normal range of motion and neck supple.     Comments: Walking with cane. (+) SLR on right. Pain with standing  Lymphadenopathy:     Cervical: No cervical adenopathy.  Skin:    General: Skin is  warm and dry.  Neurological:     Mental Status: He is alert and oriented to person, place, and time.  Psychiatric:        Behavior: Behavior normal.        Thought Content: Thought content normal.        Judgment: Judgment normal.     BP 137/84   Pulse 67   Temp (!) 97.5 F (36.4 C) (Temporal)   Ht 5' 8 (1.727 m)   Wt 198 lb (89.8 kg)   SpO2 97%   BMI 30.11 kg/m   HGBA1c 5.3       Assessment & Plan:   David Garza comes in today with chief complaint of annual physical   Diagnosis and orders addressed:  1. Annual physical exam (Primary)   2. Hypertension associated with diabetes (HCC) Dash diet Watch blood pressure after toradol  shot - CBC with Differential/Platelet - CMP14+EGFR - lisinopril -hydrochlorothiazide  (ZESTORETIC ) 20-25 MG tablet; Take 1 tablet by mouth daily.  Dispense: 90 tablet; Refill: 1  3. Hyperlipidemia associated with type 2 diabetes mellitus (HCC) Low fat diet - Lipid panel - pravastatin  (PRAVACHOL ) 80 MG tablet; Take 1 tablet (80 mg total) by mouth daily.  Dispense: 90 tablet; Refill: 1  4. Diabetes mellitus treated with oral medication (HCC) Continue to watch carbs in diet Blood sugars will go up while on steroids - Bayer DCA Hb A1c Waived - Microalbumin / creatinine urine ratio   5. Neuropathy due to medical condition (HCC) - DULoxetine  (CYMBALTA ) 60 MG capsule; Take 1 capsule (60 mg total) by mouth daily.  Dispense: 90 capsule; Refill: 1 - gabapentin  (NEURONTIN ) 100 MG capsule; Take 2 capsules (200 mg total) by mouth 2 (two) times daily.   Dispense: 120 capsule; Refill: 1  6. Gastroesophageal reflux disease without esophagitis Avoid spicy foods Do not eat 2 hours prior to bedtime  - omeprazole  (PRILOSEC) 40 MG capsule; Take 1 capsule (40 mg total) by mouth daily.  Dispense: 90 capsule; Refill: 1  7. Late effect of cerebrovascular accident (CVA) Fall prevention - clopidogrel  (PLAVIX ) 75 MG tablet; Take 1 tablet (75 mg total) by mouth daily.  Dispense: 90 tablet; Refill: 1  8. Idiopathic chronic gout of right foot without tophus Low purine diet  9. BMI 33.0-33.9,adult Discussed diet and exercise for person with BMI >25 Will recheck weight in 3-6 months   10. Sciatica of right side Stretches Moist heat - ketorolac  (TORADOL ) injection 60 mg - HYDROcodone -acetaminophen  (NORCO/VICODIN) 5-325 MG tablet; Take 1 tablet by mouth every 6 (six) hours as needed for moderate pain (pain score 4-6).  Dispense: 30 tablet; Refill: 0 - predniSONE  (STERAPRED UNI-PAK 21 TAB) 10 MG (21) TBPK tablet; As directed x 6 days  Dispense: 21 tablet; Refill: 0  Labs pending Health Maintenance reviewed Diet and exercise encouraged  Follow up plan: 6 months    Mary-Margaret Gladis, FNP

## 2023-12-06 LAB — MICROALBUMIN / CREATININE URINE RATIO
Creatinine, Urine: 162.4 mg/dL
Microalb/Creat Ratio: 7 mg/g{creat} (ref 0–29)
Microalbumin, Urine: 10.6 ug/mL

## 2023-12-07 ENCOUNTER — Ambulatory Visit: Payer: Self-pay | Admitting: Nurse Practitioner

## 2023-12-08 NOTE — Addendum Note (Signed)
 Addended by: Kipton Skillen, MARY-MARGARET on: 12/08/2023 01:13 PM   Modules accepted: Level of Service

## 2023-12-11 ENCOUNTER — Other Ambulatory Visit

## 2023-12-14 ENCOUNTER — Ambulatory Visit: Payer: Medicare HMO | Admitting: Nurse Practitioner

## 2023-12-14 ENCOUNTER — Other Ambulatory Visit: Payer: Self-pay

## 2023-12-14 DIAGNOSIS — M5441 Lumbago with sciatica, right side: Secondary | ICD-10-CM

## 2023-12-15 ENCOUNTER — Other Ambulatory Visit: Payer: Self-pay | Admitting: Nurse Practitioner

## 2023-12-15 DIAGNOSIS — M5431 Sciatica, right side: Secondary | ICD-10-CM

## 2023-12-15 MED ORDER — HYDROCODONE-ACETAMINOPHEN 5-325 MG PO TABS: 1.0000 | ORAL_TABLET | Freq: Four times a day (QID) | ORAL | 0 refills | Status: DC | PRN

## 2023-12-19 DIAGNOSIS — M5416 Radiculopathy, lumbar region: Secondary | ICD-10-CM | POA: Diagnosis not present

## 2023-12-29 DIAGNOSIS — M47816 Spondylosis without myelopathy or radiculopathy, lumbar region: Secondary | ICD-10-CM | POA: Diagnosis not present

## 2023-12-29 DIAGNOSIS — M5416 Radiculopathy, lumbar region: Secondary | ICD-10-CM | POA: Diagnosis not present

## 2023-12-31 DIAGNOSIS — M25551 Pain in right hip: Secondary | ICD-10-CM | POA: Diagnosis not present

## 2023-12-31 DIAGNOSIS — Z886 Allergy status to analgesic agent status: Secondary | ICD-10-CM | POA: Diagnosis not present

## 2023-12-31 DIAGNOSIS — M79604 Pain in right leg: Secondary | ICD-10-CM | POA: Diagnosis not present

## 2023-12-31 DIAGNOSIS — M5431 Sciatica, right side: Secondary | ICD-10-CM | POA: Diagnosis not present

## 2023-12-31 DIAGNOSIS — M7918 Myalgia, other site: Secondary | ICD-10-CM | POA: Diagnosis not present

## 2023-12-31 DIAGNOSIS — Z888 Allergy status to other drugs, medicaments and biological substances status: Secondary | ICD-10-CM | POA: Diagnosis not present

## 2023-12-31 DIAGNOSIS — R102 Pelvic and perineal pain: Secondary | ICD-10-CM | POA: Diagnosis not present

## 2023-12-31 DIAGNOSIS — M5430 Sciatica, unspecified side: Secondary | ICD-10-CM | POA: Diagnosis not present

## 2024-01-04 ENCOUNTER — Ambulatory Visit: Payer: Self-pay

## 2024-01-04 DIAGNOSIS — Z7902 Long term (current) use of antithrombotics/antiplatelets: Secondary | ICD-10-CM | POA: Diagnosis not present

## 2024-01-04 DIAGNOSIS — M5431 Sciatica, right side: Secondary | ICD-10-CM | POA: Diagnosis not present

## 2024-01-04 DIAGNOSIS — M79604 Pain in right leg: Secondary | ICD-10-CM | POA: Diagnosis not present

## 2024-01-04 DIAGNOSIS — M549 Dorsalgia, unspecified: Secondary | ICD-10-CM | POA: Diagnosis not present

## 2024-01-04 NOTE — Telephone Encounter (Signed)
Advised to go to ER

## 2024-01-04 NOTE — Telephone Encounter (Signed)
 FYI Only or Action Required?: Action required by provider: States wants MRI ordered STAT since it is currently scheduled for this Sat and is having severe pain.  Instructed to go to ER.  Patient was last seen in primary care on 12/05/2023 by Gladis Mustard, FNP.  Called Nurse Triage reporting Back Pain.  Symptoms began several months ago.  Interventions attempted: Other: currently on pain management.  Symptoms are: rapidly worsening.  Triage Disposition: Go to ED Now (Notify PCP)  Patient/caregiver understands and will follow disposition?: Yes      Copied from CRM [id number]. Topic: Clinical - Red Word Triage >> Jan 04, 2024  8:15 AM Alfonso ORN wrote: Red Word that prompted transfer to Nurse Triage: patient have lower back pain and down right side of lower leg have severe pain Spouse Rock Popper called for patient Reason for Disposition  [1] SEVERE back pain (e.g., excruciating) AND [2] sudden onset AND [3] age > 60 years  Answer Assessment - Initial Assessment Questions 1. ONSET: When did the pain begin? (e.g., minutes, hours, days)     Hx back pain since last week in July 2. LOCATION: Where does it hurt? (upper, mid or lower back)     Lower back  3. SEVERITY: How bad is the pain?  (e.g., Scale 1-10; mild, moderate, or severe)     Severe pain, is scheduled for MRI on this Saturday 4. PATTERN: Is the pain constant? (e.g., yes, no; constant, intermittent)      constant 5. RADIATION: Does the pain shoot into your legs or somewhere else?     Down right side of lower leg 6. CAUSE:  What do you think is causing the back pain?      unknown 7. BACK OVERUSE:  Any recent lifting of heavy objects, strenuous work or exercise?     denies 8. MEDICINES: What have you taken so far for the pain? (e.g., nothing, acetaminophen , NSAIDS)     Is currently on pain management 9. NEUROLOGIC SYMPTOMS: Do you have any weakness, numbness, or problems with bowel/bladder control?      Was seen in ER this past Sunday and was given dexamethasone and benadryl and hydromorphone, ketorolac  and helped a little bit.  Tuesday he couldn't hardly walk. Denies neurologic symptoms 10. OTHER SYMPTOMS: Do you have any other symptoms? (e.g., fever, abdomen pain, burning with urination, blood in urine)       denies 11. PREGNANCY: Is there any chance you are pregnant? When was your last menstrual period?       na  Protocols used: Back Pain-A-AH

## 2024-01-06 DIAGNOSIS — M5416 Radiculopathy, lumbar region: Secondary | ICD-10-CM | POA: Diagnosis not present

## 2024-01-08 ENCOUNTER — Ambulatory Visit: Admitting: Nurse Practitioner

## 2024-01-08 ENCOUNTER — Encounter: Payer: Self-pay | Admitting: Nurse Practitioner

## 2024-01-08 VITALS — BP 100/65 | HR 88 | Temp 96.7°F | Ht 68.0 in | Wt 200.0 lb

## 2024-01-08 DIAGNOSIS — M5431 Sciatica, right side: Secondary | ICD-10-CM | POA: Diagnosis not present

## 2024-01-08 DIAGNOSIS — Z09 Encounter for follow-up examination after completed treatment for conditions other than malignant neoplasm: Secondary | ICD-10-CM | POA: Diagnosis not present

## 2024-01-08 MED ORDER — HYDROCODONE-ACETAMINOPHEN 5-325 MG PO TABS: 1.0000 | ORAL_TABLET | Freq: Four times a day (QID) | ORAL | 0 refills | Status: DC | PRN

## 2024-01-08 MED ORDER — PREDNISONE 20 MG PO TABS: 40.0000 mg | ORAL_TABLET | Freq: Every day | ORAL | 0 refills | Status: AC

## 2024-01-08 NOTE — Progress Notes (Signed)
 Subjective:    Patient ID: David Garza, male    DOB: 13-Jun-1950, 73 y.o.   MRN: [id number]   Chief Complaint: back pain  HPI  Patient has had back pain for over a month. He has been seen several times in office. Has been treated with steroids. He is here today because he has been to the ED 2x since last visit with back pain. Both times he was treated with steroids. He is not much better. Pain radiates down his leg.he rates pain 8/10 currently. Any movement increases pain. He had MRI at ortho office Saturday but results are not available.he has appointment Friday to discuss MRI results.  Patient Active Problem List   Diagnosis Date Noted   Diabetes mellitus treated with oral medication (HCC) 09/16/2021   Neuropathy due to medical condition (HCC) 07/05/2018   Late effect of cerebrovascular accident (CVA) 01/10/2017   Gastroesophageal reflux disease without esophagitis 03/06/2015   BMI 33.0-33.9,adult 03/06/2015   Hypertension associated with diabetes (HCC) 09/17/2012   Hyperlipidemia associated with type 2 diabetes mellitus (HCC) 09/17/2012   Gout 09/17/2012        Review of Systems  Constitutional:  Negative for diaphoresis.  Eyes:  Negative for pain.  Respiratory:  Negative for shortness of breath.   Cardiovascular:  Negative for chest pain, palpitations and leg swelling.  Gastrointestinal:  Negative for abdominal pain.  Endocrine: Negative for polydipsia.  Skin:  Negative for rash.  Neurological:  Negative for dizziness, weakness and headaches.  Hematological:  Does not bruise/bleed easily.  All other systems reviewed and are negative.      Objective:   Physical Exam Constitutional:      Appearance: Normal appearance.  Cardiovascular:     Rate and Rhythm: Normal rate and regular rhythm.     Heart sounds: Normal heart sounds.  Pulmonary:     Effort: Pulmonary effort is normal.     Breath sounds: Normal breath sounds.  Musculoskeletal:     Comments: Decrease    lumbar spine with pain on flexion , extension and rotation (+) SLR right at 45 degrees  Skin:    General: Skin is warm.  Neurological:     General: No focal deficit present.     Mental Status: He is alert and oriented to person, place, and time.  Psychiatric:        Mood and Affect: Mood normal.        Behavior: Behavior normal.     BP 100/65   Pulse 88   Temp (!) 96.7 F (35.9 C) (Temporal)   Ht 5' 8 (1.727 m)   Wt 200 lb (90.7 kg)   SpO2 92%   BMI 30.41 kg/m        Assessment & Plan:   [name] in today with chief complaint of Hospitalization Follow-up   1. Sciatica of right side (Primary) Moist heat Rest Keep follow up with ortho friday - HYDROcodone -acetaminophen  (NORCO/VICODIN) 5-325 MG tablet; Take 1 tablet by mouth every 6 (six) hours as needed for moderate pain (pain score 4-6).  Dispense: 30 tablet; Refill: 0 - predniSONE  (DELTASONE ) 20 MG tablet; Take 2 tablets (40 mg total) by mouth daily with breakfast for 5 days. 2 po daily for 5 days  Dispense: 10 tablet; Refill: 0  2. Hospital follow up Hospital records reviewed  The above assessment and management plan was discussed with the patient. The patient verbalized understanding of and has agreed to the management plan. Patient is aware  to call the clinic if symptoms persist or worsen. Patient is aware when to return to the clinic for a follow-up visit. Patient educated on when it is appropriate to go to the emergency department.   Mary-Margaret Gladis, FNP

## 2024-01-08 NOTE — Patient Instructions (Signed)
 Sciatica  Sciatica is pain, weakness, tingling, or loss of feeling (numbness) along the sciatic nerve. The sciatic nerve starts in the lower back and goes down the back of each leg. Sciatica usually affects one side of the body. Sciatica usually goes away on its own or with treatment. Sometimes, sciatica may come back. What are the causes? This condition happens when the sciatic nerve is pinched or has pressure put on it. This may be caused by: A disk in between the bones of the spine bulging out too far (herniated disk). Changes in the spinal disks due to aging. A condition that affects a muscle in the butt. Extra bone growth near the sciatic nerve. A break (fracture) of the area between your hip bones (pelvis). Pregnancy. Tumor. This is rare. What increases the risk? You are more likely to develop this condition if you: Play sports that put pressure or stress on the spine. Have poor strength and ease of movement (flexibility). Have had a back injury or back surgery. Sit for long periods of time. Do activities that involve bending or lifting over and over again. Are very overweight (obese). What are the signs or symptoms? Symptoms can vary from mild to very bad. They may include: Any of these problems in the lower back, leg, hip, or butt: Mild tingling, loss of feeling, or dull aches. A burning feeling. Sharp pains. Loss of feeling in the back of the calf or the sole of the foot. Leg weakness. Very bad back pain that makes it hard to move. These symptoms may get worse when you cough, sneeze, or laugh. They may also get worse when you sit or stand for long periods of time. How is this treated? This condition often gets better without any treatment. However, treatment may include: Changing or cutting back on physical activity when you have pain. Exercising, including strengthening and stretching. Putting ice or heat on the affected area. Shots of medicines to relieve pain and  swelling or to relax your muscles. Surgery. Follow these instructions at home: Medicines Take over-the-counter and prescription medicines only as told by your doctor. Ask your doctor if you should avoid driving or using machines while you are taking your medicine. Managing pain     If told, put ice on the affected area. To do this: Put ice in a plastic bag. Place a towel between your skin and the bag. Leave the ice on for 20 minutes, 2-3 times a day. If your skin turns bright red, take off the ice right away to prevent skin damage. The risk of skin damage is higher if you cannot feel pain, heat, or cold. If told, put heat on the affected area. Do this as often as told by your doctor. Use the heat source that your doctor tells you to use, such as a moist heat pack or a heating pad. Place a towel between your skin and the heat source. Leave the heat on for 20-30 minutes. If your skin turns bright red, take off the heat right away to prevent burns. The risk of burns is higher if you cannot feel pain, heat, or cold. Activity  Return to your normal activities when your doctor says that it is safe. Avoid activities that make your symptoms worse. Take short rests during the day. When you rest for a long time, do some physical activity or stretching between periods of rest. Avoid sitting for a long time without moving. Get up and move around at least one time each  hour. Do exercises and stretches as told by your doctor. Do not lift anything that is heavier than 10 lb (4.5 kg). Avoid lifting heavy things even when you do not have symptoms. Avoid lifting heavy things over and over. When you lift objects, always lift in a way that is safe for your body. To do this, you should: Bend your knees. Keep the object close to your body. Avoid twisting. General instructions Stay at a healthy weight. Wear comfortable shoes that support your feet. Avoid wearing high heels. Avoid sleeping on a mattress  that is too soft or too hard. You might have less pain if you sleep on a mattress that is firm enough to support your back. Contact a doctor if: Your pain is not controlled by medicine. Your pain does not get better. Your pain gets worse. Your pain lasts longer than 4 weeks. You lose weight without trying. Get help right away if: You cannot control when you pee (urinate) or poop (have a bowel movement). You have weakness in any of these areas and it gets worse: Lower back. The area between your hip bones. Butt. Legs. You have redness or swelling of your back. You have a burning feeling when you pee. Summary Sciatica is pain, weakness, tingling, or loss of feeling (numbness) along the sciatic nerve. This may include the lower back, legs, hips, and butt. This condition happens when the sciatic nerve is pinched or has pressure put on it. Treatment often includes rest, exercise, medicines, and putting ice or heat on the affected area. This information is not intended to replace advice given to you by your health care provider. Make sure you discuss any questions you have with your health care provider. Document Revised: 07/26/2021 Document Reviewed: 07/26/2021 Elsevier Patient Education  2024 [employer].

## 2024-01-12 ENCOUNTER — Encounter: Payer: Self-pay | Admitting: Family

## 2024-01-12 DIAGNOSIS — M5416 Radiculopathy, lumbar region: Secondary | ICD-10-CM | POA: Diagnosis not present

## 2024-01-15 ENCOUNTER — Other Ambulatory Visit: Payer: Self-pay | Admitting: Nurse Practitioner

## 2024-01-15 DIAGNOSIS — M5441 Lumbago with sciatica, right side: Secondary | ICD-10-CM

## 2024-01-15 MED ORDER — PREDNISONE 20 MG PO TABS: 40.0000 mg | ORAL_TABLET | Freq: Every day | ORAL | 0 refills | Status: AC

## 2024-01-15 NOTE — Progress Notes (Signed)
 Needs back injection for a pinched nerve. Has to be off plavix  for 5 days needs steroids to help until procedure can be scheduled.  Meds ordered this encounter  Medications   predniSONE  (DELTASONE ) 20 MG tablet    Sig: Take 2 tablets (40 mg total) by mouth daily with breakfast for 5 days. 2 po daily for 5 days    Dispense:  10 tablet    Refill:  0    Supervising Provider:   MARYANNE CHEW A [[id number]]   Mary-Margaret Gladis, FNP

## 2024-01-24 DIAGNOSIS — M5416 Radiculopathy, lumbar region: Secondary | ICD-10-CM | POA: Diagnosis not present

## 2024-01-30 DIAGNOSIS — Z1212 Encounter for screening for malignant neoplasm of rectum: Secondary | ICD-10-CM | POA: Diagnosis not present

## 2024-01-30 LAB — COLOGUARD: Cologuard: POSITIVE — AB

## 2024-02-07 DIAGNOSIS — M545 Low back pain, unspecified: Secondary | ICD-10-CM | POA: Diagnosis not present

## 2024-02-07 LAB — COLOGUARD: COLOGUARD: POSITIVE — AB

## 2024-02-22 ENCOUNTER — Telehealth: Payer: Self-pay | Admitting: Family Medicine

## 2024-02-22 ENCOUNTER — Telehealth: Payer: Self-pay

## 2024-02-22 NOTE — Telephone Encounter (Signed)
 Copied from CRM [id number]. Topic: General - Call Back - No Documentation >> Feb 22, 2024 11:16 AM Avram MATSU wrote: Reason for CRM: wife called for her husband and would like amanda to call her back

## 2024-02-22 NOTE — Telephone Encounter (Signed)
 Received report from Cologuard that was ordered by patients insurance that came back positive. Called and spoke with wife and notified her of positive results. She states that patient is currently being treated for back pain and is getting ready to get another injection. She states that after this 2nd injection she will contact our office so we can set up the referral to GI.

## 2024-02-22 NOTE — Telephone Encounter (Signed)
 Attempted to contact patient about positive cologuard and left message to contact the office

## 2024-03-14 ENCOUNTER — Ambulatory Visit (INDEPENDENT_AMBULATORY_CARE_PROVIDER_SITE_OTHER): Admitting: Nurse Practitioner

## 2024-03-14 ENCOUNTER — Encounter: Payer: Self-pay | Admitting: Nurse Practitioner

## 2024-03-14 VITALS — BP 118/78 | HR 98 | Temp 97.9°F | Ht 68.0 in | Wt 201.0 lb

## 2024-03-14 DIAGNOSIS — R0981 Nasal congestion: Secondary | ICD-10-CM | POA: Diagnosis not present

## 2024-03-14 DIAGNOSIS — U071 COVID-19: Secondary | ICD-10-CM | POA: Diagnosis not present

## 2024-03-14 LAB — VERITOR SARS-COV-2 AND FLU A+B
BD Veritor SARS-CoV-2 Ag: POSITIVE — AB
Influenza A: NEGATIVE
Influenza B: NEGATIVE

## 2024-03-14 MED ORDER — HYDROCODONE BIT-HOMATROP MBR 5-1.5 MG/5ML PO SOLN: 5.0000 mL | Freq: Four times a day (QID) | ORAL | 0 refills | Status: DC | PRN

## 2024-03-14 MED ORDER — AMOXICILLIN-POT CLAVULANATE 875-125 MG PO TABS: 1.0000 | ORAL_TABLET | Freq: Two times a day (BID) | ORAL | 0 refills | Status: DC

## 2024-03-14 NOTE — Progress Notes (Signed)
 Subjective:    Patient ID: David Garza, male    DOB: 03/11/1951, 73 y.o.   MRN: [id number]   Chief Complaint: Sore Throat and Cough   URI  This is a new problem. The current episode started in the past 7 days. The problem has been waxing and waning. The fever has been present for 1 to 2 days. Associated symptoms include congestion, coughing and rhinorrhea. Pertinent negatives include no sinus pain. He has tried acetaminophen  and decongestant for the symptoms. The treatment provided mild relief.    Patient Active Problem List   Diagnosis Date Noted   Diabetes mellitus treated with oral medication (HCC) 09/16/2021   Neuropathy due to medical condition 07/05/2018   Late effect of cerebrovascular accident (CVA) 01/10/2017   Gastroesophageal reflux disease without esophagitis 03/06/2015   BMI 33.0-33.9,adult 03/06/2015   Hypertension associated with diabetes (HCC) 09/17/2012   Hyperlipidemia associated with type 2 diabetes mellitus (HCC) 09/17/2012   Gout 09/17/2012       Review of Systems  Constitutional:  Negative for chills and fever.  HENT:  Positive for congestion and rhinorrhea. Negative for sinus pain.   Respiratory:  Positive for cough.        Objective:   Physical Exam Constitutional:      Appearance: Normal appearance.  Cardiovascular:     Rate and Rhythm: Normal rate and regular rhythm.     Heart sounds: Normal heart sounds.  Pulmonary:     Breath sounds: Normal breath sounds.  Skin:    General: Skin is warm.  Neurological:     General: No focal deficit present.     Mental Status: He is alert and oriented to person, place, and time.  Psychiatric:        Mood and Affect: Mood normal.        Behavior: Behavior normal.     BP 118/78   Pulse 98   Temp 97.9 F (36.6 C) (Temporal)   Ht 5' 8 (1.727 m)   Wt 201 lb (91.2 kg)   SpO2 93%   BMI 30.56 kg/m        Assessment & Plan:   David Garza in today with chief complaint of Sore Throat  and Cough   1. Congestion of nasal sinus (Primary) 2. Covid positive 1. Take meds as prescribed 2. Use a cool mist humidifier especially during the winter months and when heat has been humid. 3. Use saline nose sprays frequently 4. Saline irrigations of the nose can be very helpful if done frequently.  * 4X daily for 1 week*  * Use of a nettie pot can be helpful with this. Follow directions with this* 5. Drink plenty of fluids 6. Keep thermostat turn down low 7.For any cough or congestion- hycodan with sedation precautions 8. For fever or aces or pains- take tylenol  or ibuprofen appropriate for age and weight.  * for fevers greater than 101 orally you may alternate ibuprofen and tylenol  every  3 hours.    - Veritor SARS-CoV-2 and Flu A+B - amoxicillin -clavulanate (AUGMENTIN ) 875-125 MG tablet; Take 1 tablet by mouth 2 (two) times daily.  Dispense: 14 tablet; Refill: 0 - HYDROcodone  bit-homatropine (HYCODAN) 5-1.5 MG/5ML syrup; Take 5 mLs by mouth every 6 (six) hours as needed for cough.  Dispense: 120 mL; Refill: 0    The above assessment and management plan was discussed with the patient. The patient verbalized understanding of and has agreed to the management plan. Patient is aware to  call the clinic if symptoms persist or worsen. Patient is aware when to return to the clinic for a follow-up visit. Patient educated on when it is appropriate to go to the emergency department.   Mary-Margaret Gladis, FNP

## 2024-03-14 NOTE — Patient Instructions (Signed)
 Quarantine and Isolation: What to Know Quarantine and isolation are ways to protect the public from diseases that: Could make a person very sick. Are contagious, which means they spread easily from person to person. Isolation is when you are sick and have to stay home and away from other people. Anne Shutter is when you have to stay home for a certain amount of time after being around a sick person. This is to see if you become sick. What diseases do I need to quarantine or isolate for? You may need to quarantine or isolate if you have or been around someone with: Cholera. Diphtheria. Infectious TB, also called tuberculosis. Plague. Smallpox. Yellow fever. Viral hemorrhagic fevers, such as [place], Ebola, and [place]. Severe acute respiratory syndromes, such as COVID-19. Flu. Measles. When should I quarantine? You should quarantine when you've been in close contact with someone who has, or is thought to have, a disease that spreads easily. Close contact means you've been less than 6 ft (1.8 m) away from the person for 15 minutes or more within a 24-hour period. When should I isolate? You should isolate when: You are sick with a disease that spreads easily. You test positive for a disease that spreads easily, even if you don't have symptoms. You are sick and think you have a disease that spreads easily. Get tested if you think you have a disease that spreads easily. If your test results are negative, you can stop isolation if for at least 24 hours both are true: You don't have symptoms, or your symptoms are getting better. You haven't had a fever without using fever-reducing medicine. Sometimes, 2 negative tests are needed to stop isolation. If your test results are positive, isolate as told by your health care provider or health officials. Follow these instructions at home: Medicines  Take your medicines only as told. If you were given antibiotics, take them as told. Do not  stop taking them even if you start to feel better. Stay up to date on your shots, also called vaccines. Get vaccines and booster shots as told. Lifestyle When you're in quarantine or isolation: If you must be around other people at home or in public, wear a high-quality mask that fits well. Do not get close to people who may get very sick. Use a bathroom that you don't have to share with other people, if you can. Do not share personal items like cups, towels, and spoons or forks. Wash your hands often with soap and water for at least 20 seconds. If you can't use soap and water, use hand sanitizer. Get better air flow in your home by opening a window or door. This may stop the disease from spreading.  General instructions Do not travel if you are in quarantine or isolation. Travel only when your provider or health officials say it's OK. Call your provider or health department if you need advice on how to care for yourself. Talk to your provider if you are immunocompromised. This means your body can't fight infections easily. Vaccines might not work as well for you. If you are sick, closely watch your symptoms. Follow instructions from your provider. You may be told to rest, drink fluids, and take medicine. Follow guidelines for quarantine and isolation, especially if you are in a place where diseases can spread easily and quickly. These places include jails, homeless shelters, and cruise ships. Where to find more information To learn more: Go to [employer] Click "Health Topics." Type "quarantine" in the search box.  This information is not intended to replace advice given to you by your health care provider. Make sure you discuss any questions you have with your health care provider. Document Revised: 01/09/2023 Document Reviewed: 01/09/2023 Elsevier Patient Education  2024 [employer].

## 2024-03-18 ENCOUNTER — Ambulatory Visit: Payer: Self-pay | Admitting: Nurse Practitioner

## 2024-04-03 DIAGNOSIS — M5416 Radiculopathy, lumbar region: Secondary | ICD-10-CM | POA: Diagnosis not present

## 2024-04-17 DIAGNOSIS — M5416 Radiculopathy, lumbar region: Secondary | ICD-10-CM | POA: Diagnosis not present

## 2024-04-23 DIAGNOSIS — R195 Other fecal abnormalities: Secondary | ICD-10-CM

## 2024-05-27 ENCOUNTER — Other Ambulatory Visit: Payer: Self-pay

## 2024-05-29 ENCOUNTER — Other Ambulatory Visit

## 2024-05-29 DIAGNOSIS — E1169 Type 2 diabetes mellitus with other specified complication: Secondary | ICD-10-CM

## 2024-05-29 DIAGNOSIS — E119 Type 2 diabetes mellitus without complications: Secondary | ICD-10-CM

## 2024-05-29 DIAGNOSIS — I10 Essential (primary) hypertension: Secondary | ICD-10-CM

## 2024-05-29 LAB — LIPID PANEL
Chol/HDL Ratio: 4.8 ratio (ref 0.0–5.0)
Cholesterol, Total: 163 mg/dL (ref 100–199)
HDL: 34 mg/dL — ABNORMAL LOW
LDL Chol Calc (NIH): 97 mg/dL (ref 0–99)
Triglycerides: 184 mg/dL — ABNORMAL HIGH (ref 0–149)
VLDL Cholesterol Cal: 32 mg/dL (ref 5–40)

## 2024-05-29 LAB — CMP14+EGFR
ALT: 14 [IU]/L (ref 0–44)
AST: 16 [IU]/L (ref 0–40)
Albumin: 4.7 g/dL (ref 3.8–4.8)
Alkaline Phosphatase: 42 [IU]/L — ABNORMAL LOW (ref 47–123)
BUN/Creatinine Ratio: 9 — ABNORMAL LOW (ref 10–24)
BUN: 10 mg/dL (ref 8–27)
Bilirubin Total: 1 mg/dL (ref 0.0–1.2)
CO2: 25 mmol/L (ref 20–29)
Calcium: 9.9 mg/dL (ref 8.6–10.2)
Chloride: 96 mmol/L (ref 96–106)
Creatinine, Ser: 1.06 mg/dL (ref 0.76–1.27)
Globulin, Total: 2.3 g/dL (ref 1.5–4.5)
Glucose: 100 mg/dL — ABNORMAL HIGH (ref 70–99)
Potassium: 4.1 mmol/L (ref 3.5–5.2)
Sodium: 136 mmol/L (ref 134–144)
Total Protein: 7 g/dL (ref 6.0–8.5)
eGFR: 75 mL/min/{1.73_m2}

## 2024-05-29 LAB — CBC WITH DIFFERENTIAL/PLATELET
Basophils Absolute: 0.1 10*3/uL (ref 0.0–0.2)
Basos: 1 %
EOS (ABSOLUTE): 0.3 10*3/uL (ref 0.0–0.4)
Eos: 4 %
Hematocrit: 46.8 % (ref 37.5–51.0)
Hemoglobin: 16.2 g/dL (ref 13.0–17.7)
Immature Grans (Abs): 0 10*3/uL (ref 0.0–0.1)
Immature Granulocytes: 0 %
Lymphocytes Absolute: 1.2 10*3/uL (ref 0.7–3.1)
Lymphs: 17 %
MCH: 30.3 pg (ref 26.6–33.0)
MCHC: 34.6 g/dL (ref 31.5–35.7)
MCV: 88 fL (ref 79–97)
Monocytes Absolute: 0.5 10*3/uL (ref 0.1–0.9)
Monocytes: 7 %
Neutrophils Absolute: 5.1 10*3/uL (ref 1.4–7.0)
Neutrophils: 71 %
Platelets: 243 10*3/uL (ref 150–450)
RBC: 5.35 x10E6/uL (ref 4.14–5.80)
RDW: 12 % (ref 11.6–15.4)
WBC: 7.3 10*3/uL (ref 3.4–10.8)

## 2024-05-29 LAB — BAYER DCA HB A1C WAIVED: HB A1C (BAYER DCA - WAIVED): 5.8 % — ABNORMAL HIGH (ref 4.8–5.6)

## 2024-05-30 ENCOUNTER — Encounter: Payer: Self-pay | Admitting: Internal Medicine

## 2024-05-30 ENCOUNTER — Ambulatory Visit: Payer: Self-pay | Admitting: Nurse Practitioner

## 2024-05-30 LAB — MICROALBUMIN / CREATININE URINE RATIO
Creatinine, Urine: 47.5 mg/dL
Microalb/Creat Ratio: <6 mg/g{creat} (ref 0–29)
Microalbumin, Urine: <3 ug/mL

## 2024-05-31 ENCOUNTER — Telehealth: Payer: Self-pay | Admitting: Nurse Practitioner

## 2024-05-31 NOTE — Telephone Encounter (Signed)
 Called and left a message on patient's voicemail to let him know the office would be opening on a 2 hour delay and we need to r/s his appt for 06/03/24

## 2024-06-03 ENCOUNTER — Ambulatory Visit: Payer: Self-pay | Admitting: Nurse Practitioner

## 2024-06-04 ENCOUNTER — Ambulatory Visit: Admitting: Nurse Practitioner

## 2024-06-06 ENCOUNTER — Encounter: Payer: Self-pay | Admitting: Nurse Practitioner

## 2024-06-06 ENCOUNTER — Ambulatory Visit: Admitting: Nurse Practitioner

## 2024-06-06 DIAGNOSIS — E785 Hyperlipidemia, unspecified: Secondary | ICD-10-CM

## 2024-06-06 DIAGNOSIS — G63 Polyneuropathy in diseases classified elsewhere: Secondary | ICD-10-CM

## 2024-06-06 DIAGNOSIS — I152 Hypertension secondary to endocrine disorders: Secondary | ICD-10-CM

## 2024-06-06 DIAGNOSIS — I6930 Unspecified sequelae of cerebral infarction: Secondary | ICD-10-CM

## 2024-06-06 DIAGNOSIS — E1159 Type 2 diabetes mellitus with other circulatory complications: Secondary | ICD-10-CM

## 2024-06-06 DIAGNOSIS — E1169 Type 2 diabetes mellitus with other specified complication: Secondary | ICD-10-CM

## 2024-06-06 DIAGNOSIS — K219 Gastro-esophageal reflux disease without esophagitis: Secondary | ICD-10-CM

## 2024-06-06 MED ORDER — DULOXETINE HCL 60 MG PO CPEP: 60.0000 mg | ORAL_CAPSULE | Freq: Every day | ORAL | 1 refills | Status: AC

## 2024-06-06 MED ORDER — PRAVASTATIN SODIUM 80 MG PO TABS: 80.0000 mg | ORAL_TABLET | Freq: Every day | ORAL | 1 refills | Status: AC

## 2024-06-06 MED ORDER — CLOPIDOGREL BISULFATE 75 MG PO TABS: 75.0000 mg | ORAL_TABLET | Freq: Every day | ORAL | 1 refills | Status: AC

## 2024-06-06 MED ORDER — GABAPENTIN 100 MG PO CAPS: 200.0000 mg | ORAL_CAPSULE | Freq: Two times a day (BID) | ORAL | 1 refills | Status: AC

## 2024-06-06 MED ORDER — LISINOPRIL-HYDROCHLOROTHIAZIDE 20-25 MG PO TABS: 1.0000 | ORAL_TABLET | Freq: Every day | ORAL | 1 refills | Status: AC

## 2024-06-06 MED ORDER — OMEPRAZOLE 40 MG PO CPDR: 40.0000 mg | DELAYED_RELEASE_CAPSULE | Freq: Every day | ORAL | 1 refills | Status: AC

## 2024-06-06 NOTE — Progress Notes (Signed)
 "  Subjective:    Patient ID: David Garza, male    DOB: 28-Jan-1951, 74 y.o.   MRN: [id number]   Chief Complaint: medical management of chronic issues      HPI:  David Garza is a 74 y.o. who identifies as a male who was assigned male at birth.   Social history: Lives with: wife on farm Work history: farmer   Comes in today for follow up of the following chronic medical issues:  1. Primary hypertension No c/o chest pain, sob or headache. Does not check blood pressure at home. BP Readings from Last 3 Encounters:  06/06/24 112/72  03/14/24 118/78  01/08/24 100/65     2. Hyperlipidemia associated with type 2 diabetes mellitus (HCC) Does try to watch diet and stays very active on his farm Lab Results  Component Value Date   CHOL 163 05/29/2024   HDL 34 (L) 05/29/2024   LDLCALC 97 05/29/2024   TRIG 184 (H) 05/29/2024   CHOLHDL 4.8 05/29/2024     3. Diabetes mellitus treated with oral medication [facility]) He has not  been checking his blood sugars at home. No low blood sugars. Lab Results  Component Value Date   HGBA1C 5.8 (H) 05/29/2024     4. Neuropathy due to medical condition [facility]) Mainly in right hand since his stroke. Just feels numb all the time.  5. Late effect of cerebrovascular accident (CVA) Only issue he is having is mild right sided weakness.  6. Gastroesophageal reflux disease without esophagitis Is on omperozole daily and is doing well.  7. Idiopathic chronic gout of right foot without tophus No recent gout flare ups  8. BMI 33.0-33.9,adult Weight is down 3lbs  Wt Readings from Last 3 Encounters:  06/06/24 205 lb (93 kg)  03/14/24 201 lb (91.2 kg)  01/08/24 200 lb (90.7 kg)   BMI Readings from Last 3 Encounters:  06/06/24 31.17 kg/m  03/14/24 30.56 kg/m  01/08/24 30.41 kg/m         New complaints: None today  Allergies  Allergen Reactions   Meloxicam Hypertension   Nsaids Hypertension   Outpatient Encounter  Medications as of 06/06/2024  Medication Sig   amoxicillin -clavulanate (AUGMENTIN ) 875-125 MG tablet Take 1 tablet by mouth 2 (two) times daily.   clopidogrel  (PLAVIX ) 75 MG tablet Take 1 tablet (75 mg total) by mouth daily.   colchicine  0.6 MG tablet TAKE 2 TABLETS BY MOUTH FOR 1 DOSE, THEN 1 TABLET ONE HOUR LATER   cyclobenzaprine  (FLEXERIL ) 10 MG tablet Take 1 tablet (10 mg total) by mouth 3 (three) times daily as needed for muscle spasms.   DULoxetine  (CYMBALTA ) 60 MG capsule Take 1 capsule (60 mg total) by mouth daily.   fish oil-omega-3 fatty acids 1000 MG capsule Take 2 g by mouth daily.   gabapentin  (NEURONTIN ) 100 MG capsule Take 2 capsules (200 mg total) by mouth 2 (two) times daily.   HYDROcodone  bit-homatropine (HYCODAN) 5-1.5 MG/5ML syrup Take 5 mLs by mouth every 6 (six) hours as needed for cough.   HYDROcodone -acetaminophen  (NORCO/VICODIN) 5-325 MG tablet Take 1 tablet by mouth every 6 (six) hours as needed for moderate pain (pain score 4-6).   HYDROcodone -acetaminophen  (NORCO/VICODIN) 5-325 MG tablet Take 1 tablet by mouth every 6 (six) hours as needed for moderate pain (pain score 4-6).   lisinopril -hydrochlorothiazide  (ZESTORETIC ) 20-25 MG tablet Take 1 tablet by mouth daily.   meclizine  (ANTIVERT ) 12.5 MG tablet Take 1 tablet (12.5 mg total) by mouth 3 (three)  times daily as needed for dizziness.   omeprazole  (PRILOSEC) 40 MG capsule Take 1 capsule (40 mg total) by mouth daily.   pravastatin  (PRAVACHOL ) 80 MG tablet Take 1 tablet (80 mg total) by mouth daily.   No facility-administered encounter medications on file as of 06/06/2024.    Past Surgical History:  Procedure Laterality Date   KNEE ARTHROSCOPY Right    SHOULDER SURGERY Right     Family History  Problem Relation Age of Onset   Cancer Mother        pancreatic   Alzheimer's disease Mother 49   Diabetes Sister    Aneurysm Sister        questionable - possible heart attack    Coronary artery disease Maternal  Grandfather    Alcohol abuse Maternal Grandfather    GI Disease Maternal Grandfather    Early death Father    Alzheimer's disease Paternal Uncle 100   Aneurysm Paternal Uncle    Cancer Sister        male      Controlled substance contract: n/a     Review of Systems  Constitutional:  Negative for diaphoresis.  Eyes:  Negative for pain.  Respiratory:  Negative for shortness of breath.   Cardiovascular:  Negative for chest pain, palpitations and leg swelling.  Gastrointestinal:  Negative for abdominal pain.  Endocrine: Negative for polydipsia.  Musculoskeletal:  Positive for back pain.  Skin:  Negative for rash.  Neurological:  Negative for dizziness, weakness and headaches.  Hematological:  Does not bruise/bleed easily.  All other systems reviewed and are negative.      Objective:   Physical Exam Vitals and nursing note reviewed.  Constitutional:      Appearance: Normal appearance. He is well-developed.  HENT:     Head: Normocephalic.     Nose: Nose normal.     Mouth/Throat:     Mouth: Mucous membranes are moist.     Pharynx: Oropharynx is clear.  Eyes:     Pupils: Pupils are equal, round, and reactive to light.  Neck:     Thyroid: No thyroid mass or thyromegaly.     Vascular: No carotid bruit or JVD.     Trachea: Phonation normal.  Cardiovascular:     Rate and Rhythm: Normal rate and regular rhythm.  Pulmonary:     Effort: Pulmonary effort is normal. No respiratory distress.     Breath sounds: Normal breath sounds.  Abdominal:     General: Bowel sounds are normal.     Palpations: Abdomen is soft.     Tenderness: There is no abdominal tenderness.  Musculoskeletal:        General: Normal range of motion.     Cervical back: Normal range of motion and neck supple.     Comments: Walking with cane. (+) SLR on right. Pain with standing  Lymphadenopathy:     Cervical: No cervical adenopathy.  Skin:    General: Skin is warm and dry.  Neurological:     Mental  Status: He is alert and oriented to person, place, and time.  Psychiatric:        Behavior: Behavior normal.        Thought Content: Thought content normal.        Judgment: Judgment normal.     BP 112/72   Pulse 80   Ht 5' 8 (1.727 m)   Wt 205 lb (93 kg)   SpO2 96%   BMI 31.17 kg/m   HGBA1c  5.3       Assessment & Plan:  David Garza in today with chief complaint of Medical Management of Chronic Issues and Hypertension   1. Gastroesophageal reflux disease without esophagitis Avoid spicy foods Do not eat 2 hours prior to bedtime - omeprazole  (PRILOSEC) 40 MG capsule; Take 1 capsule (40 mg total) by mouth daily.  Dispense: 90 capsule; Refill: 1   2. Hyperlipidemia associated with type 2 diabetes mellitus (HCC) Low fat diet - pravastatin  (PRAVACHOL ) 80 MG tablet; Take 1 tablet (80 mg total) by mouth daily.  Dispense: 90 tablet; Refill: 1 - Lipid panel  3. Hypertension associated with diabetes (HCC) Dash diet - lisinopril -hydrochlorothiazide  (ZESTORETIC ) 20-25 MG tablet; Take 1 tablet by mouth daily.  Dispense: 90 tablet; Refill: 1  4. Late effect of cerebrovascular accident (CVA) - clopidogrel  (PLAVIX ) 75 MG tablet; Take 1 tablet (75 mg total) by mouth daily.  Dispense: 90 tablet; Refill: 1  5. Neuropathy due to medical condition - DULoxetine  (CYMBALTA ) 60 MG capsule; Take 1 capsule (60 mg total) by mouth daily.  Dispense: 90 capsule; Refill: 1 - gabapentin  (NEURONTIN ) 100 MG capsule; Take 2 capsules (200 mg total) by mouth 2 (two) times daily.  Dispense: 120 capsule; Refill: 1    The above assessment and management plan was discussed with the patient. The patient verbalized understanding of and has agreed to the management plan. Patient is aware to call the clinic if symptoms persist or worsen. Patient is aware when to return to the clinic for a follow-up visit. Patient educated on when it is appropriate to go to the emergency department.   Mary-Margaret Gladis,  FNP   "

## 2024-06-06 NOTE — Patient Instructions (Signed)
 [phone]

## 2024-11-05 ENCOUNTER — Ambulatory Visit: Payer: Self-pay

## 2024-12-03 ENCOUNTER — Ambulatory Visit: Admitting: Nurse Practitioner
